# Patient Record
Sex: Female | Born: 1965 | Race: Black or African American | Hispanic: Refuse to answer | Marital: Married | Smoking: Current every day smoker
Health system: Southern US, Community
[De-identification: ages and names within clinical notes are randomized; demographics above are authoritative.]

## PROBLEM LIST (undated history)

## (undated) DIAGNOSIS — E785 Hyperlipidemia, unspecified: Secondary | ICD-10-CM

## (undated) DIAGNOSIS — I10 Essential (primary) hypertension: Secondary | ICD-10-CM

## (undated) DIAGNOSIS — F32A Depression, unspecified: Secondary | ICD-10-CM

## (undated) DIAGNOSIS — F329 Major depressive disorder, single episode, unspecified: Secondary | ICD-10-CM

## (undated) DIAGNOSIS — F319 Bipolar disorder, unspecified: Secondary | ICD-10-CM

## (undated) DIAGNOSIS — E119 Type 2 diabetes mellitus without complications: Secondary | ICD-10-CM

## (undated) HISTORY — PX: TUBAL LIGATION: SHX77

---

## 2007-12-29 ENCOUNTER — Other Ambulatory Visit: Payer: Self-pay

## 2007-12-29 ENCOUNTER — Inpatient Hospital Stay: Payer: Self-pay | Admitting: Internal Medicine

## 2008-02-18 ENCOUNTER — Ambulatory Visit: Payer: Self-pay | Admitting: Internal Medicine

## 2008-02-26 ENCOUNTER — Ambulatory Visit: Payer: Self-pay | Admitting: Internal Medicine

## 2008-04-22 ENCOUNTER — Ambulatory Visit: Payer: Self-pay | Admitting: Internal Medicine

## 2008-09-22 ENCOUNTER — Encounter: Payer: Self-pay | Admitting: Obstetrics and Gynecology

## 2008-10-03 ENCOUNTER — Encounter: Payer: Self-pay | Admitting: Maternal and Fetal Medicine

## 2008-10-17 ENCOUNTER — Encounter: Payer: Self-pay | Admitting: Maternal and Fetal Medicine

## 2008-10-31 ENCOUNTER — Encounter: Payer: Self-pay | Admitting: Obstetrics & Gynecology

## 2008-11-07 ENCOUNTER — Encounter: Payer: Self-pay | Admitting: Obstetrics and Gynecology

## 2008-11-08 ENCOUNTER — Encounter: Payer: Self-pay | Admitting: Pediatric Cardiology

## 2008-11-14 ENCOUNTER — Encounter: Payer: Self-pay | Admitting: Maternal and Fetal Medicine

## 2008-11-17 ENCOUNTER — Observation Stay: Payer: Self-pay | Admitting: Obstetrics and Gynecology

## 2008-11-21 ENCOUNTER — Encounter: Payer: Self-pay | Admitting: Obstetrics & Gynecology

## 2008-11-22 ENCOUNTER — Encounter: Payer: Self-pay | Admitting: Obstetrics & Gynecology

## 2008-11-28 ENCOUNTER — Encounter: Payer: Self-pay | Admitting: Maternal and Fetal Medicine

## 2008-11-29 ENCOUNTER — Encounter: Payer: Self-pay | Admitting: Pediatric Cardiology

## 2008-12-05 ENCOUNTER — Encounter: Payer: Self-pay | Admitting: Obstetrics and Gynecology

## 2009-04-04 ENCOUNTER — Ambulatory Visit: Payer: Self-pay | Admitting: Internal Medicine

## 2009-05-12 ENCOUNTER — Ambulatory Visit: Payer: Self-pay | Admitting: Internal Medicine

## 2010-02-09 ENCOUNTER — Emergency Department: Payer: Self-pay | Admitting: Emergency Medicine

## 2012-05-25 ENCOUNTER — Ambulatory Visit: Payer: Self-pay

## 2013-08-04 ENCOUNTER — Ambulatory Visit: Payer: Self-pay

## 2015-07-31 ENCOUNTER — Other Ambulatory Visit: Payer: Self-pay | Admitting: Gastroenterology

## 2015-07-31 DIAGNOSIS — R945 Abnormal results of liver function studies: Secondary | ICD-10-CM

## 2015-07-31 DIAGNOSIS — R748 Abnormal levels of other serum enzymes: Secondary | ICD-10-CM

## 2015-08-04 ENCOUNTER — Ambulatory Visit: Payer: BLUE CROSS/BLUE SHIELD

## 2015-08-31 ENCOUNTER — Ambulatory Visit
Admission: RE | Admit: 2015-08-31 | Discharge: 2015-08-31 | Disposition: A | Discharge status: Home / Self Care | Payer: BLUE CROSS/BLUE SHIELD | Source: Ambulatory Visit | Attending: Gastroenterology | Admitting: Gastroenterology

## 2015-08-31 DIAGNOSIS — K7689 Other specified diseases of liver: Secondary | ICD-10-CM | POA: Insufficient documentation

## 2015-08-31 DIAGNOSIS — R748 Abnormal levels of other serum enzymes: Secondary | ICD-10-CM | POA: Insufficient documentation

## 2015-08-31 DIAGNOSIS — R945 Abnormal results of liver function studies: Secondary | ICD-10-CM

## 2016-01-09 ENCOUNTER — Emergency Department
Admission: EM | Admit: 2016-01-09 | Discharge: 2016-01-09 | Disposition: A | Discharge status: Home / Self Care | Payer: BLUE CROSS/BLUE SHIELD | Attending: Emergency Medicine | Admitting: Emergency Medicine

## 2016-01-09 ENCOUNTER — Encounter: Payer: Self-pay | Admitting: Emergency Medicine

## 2016-01-09 DIAGNOSIS — F17200 Nicotine dependence, unspecified, uncomplicated: Secondary | ICD-10-CM | POA: Insufficient documentation

## 2016-01-09 DIAGNOSIS — I10 Essential (primary) hypertension: Secondary | ICD-10-CM | POA: Insufficient documentation

## 2016-01-09 DIAGNOSIS — R739 Hyperglycemia, unspecified: Secondary | ICD-10-CM

## 2016-01-09 DIAGNOSIS — E1165 Type 2 diabetes mellitus with hyperglycemia: Secondary | ICD-10-CM | POA: Insufficient documentation

## 2016-01-09 HISTORY — DX: Type 2 diabetes mellitus without complications: E11.9

## 2016-01-09 HISTORY — DX: Essential (primary) hypertension: I10

## 2016-01-09 LAB — BASIC METABOLIC PANEL
ANION GAP: 12 (ref 5–15)
BUN: 9 mg/dL (ref 6–20)
CHLORIDE: 95 mmol/L — AB (ref 101–111)
CO2: 19 mmol/L — ABNORMAL LOW (ref 22–32)
Calcium: 8.8 mg/dL — ABNORMAL LOW (ref 8.9–10.3)
Creatinine, Ser: 1.06 mg/dL — ABNORMAL HIGH (ref 0.44–1.00)
GFR calc Af Amer: >60 mL/min (ref >60)
GFR calc non Af Amer: >60 mL/min (ref >60)
GLUCOSE: 504 mg/dL — AB (ref 65–99)
POTASSIUM: 3.8 mmol/L (ref 3.5–5.1)
Sodium: 126 mmol/L — ABNORMAL LOW (ref 135–145)

## 2016-01-09 LAB — URINALYSIS COMPLETE WITH MICROSCOPIC (ARMC ONLY)
BILIRUBIN URINE: NEGATIVE
HGB URINE DIPSTICK: NEGATIVE
KETONES UR: NEGATIVE mg/dL
LEUKOCYTES UA: NEGATIVE
NITRITE: NEGATIVE
PH: 6 (ref 5.0–8.0)
Protein, ur: NEGATIVE mg/dL
Specific Gravity, Urine: 1.001 — ABNORMAL LOW (ref 1.005–1.030)

## 2016-01-09 LAB — CBC
HEMATOCRIT: 31.5 % — AB (ref 35.0–47.0)
HEMOGLOBIN: 10.1 g/dL — AB (ref 12.0–16.0)
MCH: 31.7 pg (ref 26.0–34.0)
MCHC: 32.2 g/dL (ref 32.0–36.0)
MCV: 98.2 fL (ref 80.0–100.0)
Platelets: 85 10*3/uL — ABNORMAL LOW (ref 150–440)
RBC: 3.2 MIL/uL — ABNORMAL LOW (ref 3.80–5.20)
RDW: 15.5 % — AB (ref 11.5–14.5)
WBC: 5.8 10*3/uL (ref 3.6–11.0)

## 2016-01-09 LAB — GLUCOSE, CAPILLARY
GLUCOSE-CAPILLARY: 506 mg/dL — AB (ref 65–99)
Glucose-Capillary: 246 mg/dL — ABNORMAL HIGH (ref 65–99)
Glucose-Capillary: 186 mg/dL — ABNORMAL HIGH (ref 65–99)

## 2016-01-09 MED ORDER — SODIUM CHLORIDE 0.9 % IV BOLUS (SEPSIS)
1000.0000 mL | Freq: Once | INTRAVENOUS | Status: AC
  Administered 2016-01-09: 1000 mL via INTRAVENOUS

## 2016-01-09 MED ORDER — INSULIN ASPART 100 UNIT/ML ~~LOC~~ SOLN
SUBCUTANEOUS | Status: AC
  Administered 2016-01-09: 10 [IU] via INTRAVENOUS
  Filled 2016-01-09: qty 10

## 2016-01-09 MED ORDER — INSULIN ASPART 100 UNIT/ML ~~LOC~~ SOLN
10.0000 [IU] | Freq: Once | SUBCUTANEOUS | Status: AC
  Administered 2016-01-09: 10 [IU] via INTRAVENOUS
  Filled 2016-01-09: qty 0.1

## 2016-01-09 NOTE — ED Notes (Signed)
Pt reports blood sugar at home of 600.  Took onglyza at 230. Pt is asymptomatic despite blood sugar.  Dr Archie Balboa notified for orders for fluid.

## 2016-01-09 NOTE — Discharge Instructions (Signed)
Hyperglycemia °High blood sugar (hyperglycemia) means that the level of sugar in your blood is higher than it should be. Signs of high blood sugar include: °· Feeling thirsty. °· Frequent peeing (urinating). °· Feeling tired or sleepy. °· Dry mouth. °· Vision changes. °· Feeling weak. °· Feeling hungry but losing weight. °· Numbness and tingling in your hands or feet. °· Headache. °When you ignore these signs, your blood sugar may keep going up. These problems may get worse, and other problems may begin. °HOME CARE °· Check your blood sugars as told by your doctor. Write down the numbers with the date and time. °· Take the right amount of insulin or diabetes pills at the right time. Write down the dose with date and time. °· Refill your insulin or diabetes pills before running out. °· Watch what you eat. Follow your meal plan. °· Drink liquids without sugar, such as water. Check with your doctor if you have kidney or heart disease. °· Follow your doctor's orders for exercise. Exercise at the same time of day. °· Keep your doctor's appointments. °GET HELP RIGHT AWAY IF:  °· You have trouble thinking or are confused. °· You have fast breathing with fruity smelling breath. °· You pass out (faint). °· You have 2 to 3 days of high blood sugars and you do not know why. °· You have chest pain. °· You are feeling sick to your stomach (nauseous) or throwing up (vomiting). °· You have sudden vision changes. °MAKE SURE YOU:  °· Understand these instructions. °· Will watch your condition. °· Will get help right away if you are not doing well or get worse. °  °This information is not intended to replace advice given to you by your health care provider. Make sure you discuss any questions you have with your health care provider. °  °Document Released: 10/06/2009 Document Revised: 12/30/2014 Document Reviewed: 08/15/2015 °Elsevier Interactive Patient Education ©2016 Elsevier Inc. ° °

## 2016-01-09 NOTE — ED Provider Notes (Signed)
[facility] Emergency Department Provider Note  Time seen: 6:07 PM  I have reviewed the triage vital signs and the nursing notes.   HISTORY  Chief Complaint Hyperglycemia    HPI Abigail Rice is a 50 y.o. female with a past medical history of diabetes, hypertension who presents the emergency department with an elevated blood glucose. According to the patient she was in between insurance plans, and was not taking her medications properly the last several weeks up until Saturday when her new insurance kicked in and she was able to fill her new prescription for gliptin.  Denies any recent abdominal pain, nausea, vomiting, diarrhea, fever, cough, congestion. Patient does state she has been peeing much more frequently than normal over the past several weeks. Has been taking her new medication as prescribed for the past 3 days.     Past Medical History  Diagnosis Date  . Diabetes mellitus without complication ([place])   . Hypertension     There are no active problems to display for this patient.   Past Surgical History  Procedure Laterality Date  . Cesarean section      No current outpatient prescriptions on file.  Allergies Review of patient's allergies indicates no known allergies.  History reviewed. No pertinent family history.  Social History Social History  Substance Use Topics  . Smoking status: Current Every Day Smoker  . Smokeless tobacco: None  . Alcohol Use: Yes    Review of Systems Constitutional: Negative for fever. Cardiovascular: Negative for chest pain. Respiratory: Negative for shortness of breath. Gastrointestinal: Negative for abdominal pain. Negative for nausea, vomiting, diarrhea. Genitourinary: Negative for dysuria. Positive for urinary frequency. Neurological: Negative for headache 10-point ROS otherwise negative.  ____________________________________________   PHYSICAL EXAM:  VITAL SIGNS: ED Triage Vitals  Enc  Vitals Group     BP 01/09/16 1541 104/70 mmHg     Pulse Rate 01/09/16 1541 90     Resp 01/09/16 1541 16     Temp 01/09/16 1541 98.4 F (36.9 C)     Temp Source 01/09/16 1541 Oral     SpO2 01/09/16 1541 98 %     Weight 01/09/16 1541 165 lb (74.844 kg)     Height 01/09/16 1541 5\' 5"  (1.651 m)     Head Cir --      Peak Flow --      Pain Score --      Pain Loc --      Pain Edu? --      Excl. in [place]? --     Constitutional: Alert and oriented. Well appearing and in no distress. Eyes: Normal exam ENT   Head: Normocephalic and atraumatic.   Mouth/Throat: Mucous membranes are moist. Cardiovascular: Normal rate, regular rhythm. No murmur Respiratory: Normal respiratory effort without tachypnea nor retractions. Breath sounds are clear and equal bilaterally. No wheezes/rales/rhonchi. Gastrointestinal: Soft and nontender. No distention.  Musculoskeletal: Nontender with normal range of motion in all extremities Neurologic:  Normal speech and language. No gross focal neurologic deficits Skin:  Skin is warm, dry and intact.  Psychiatric: Mood and affect are normal. Speech and behavior are normal.  ____________________________________________     INITIAL IMPRESSION / ASSESSMENT AND PLAN / ED COURSE  Pertinent labs & imaging results that were available during my care of the patient were reviewed by me and considered in my medical decision making (see chart for details).  Overall well-appearing patient presents with hyperglycemia. Normal physical exam. Nontender abdomen. Labs shows significantly  elevated glucose, normal anion gap. We will continue with IV hydration, dose IV insulin, and check every hourly finger sticks. I believe the patient's hyperglycemia is likely due to to her medication noncompliance but she was between insurance plans, now that she is back on her medications I believe this will regulate itself out over the next several days, the patient is to follow-up with her primary  care physician this week, and she is agreeable to do so.  Patient's blood glucose is now under 250. We will discharge home with primary care follow-up this week. Patient agreeable to plan. ____________________________________________   FINAL CLINICAL IMPRESSION(S) / ED DIAGNOSES  Hyperglycemia   Harvest Dark, MD 01/09/16 [phone]

## 2016-01-09 NOTE — ED Notes (Signed)
Iv d'ced from right forearm.  D/c inst to pt.

## 2016-01-09 NOTE — ED Notes (Signed)
Lab called to notify glucose 504,. Dr Archie Balboa already aware of blood sugar from [facility].

## 2016-01-09 NOTE — ED Notes (Signed)
Pt reports high blood sugar today.  Denies urinary sx, visual changes. No pain.  No n/v/d.  Pt states her diabetes medicine was changed 4 days ago.  In infusing.  Pt alert.  No acute distress.  Family with pt.

## 2016-03-28 ENCOUNTER — Encounter: Payer: Self-pay | Admitting: *Deleted

## 2016-03-29 ENCOUNTER — Encounter: Payer: Self-pay | Admitting: Anesthesiology

## 2016-03-29 ENCOUNTER — Ambulatory Visit
Admission: RE | Admit: 2016-03-29 | Discharge: 2016-03-29 | Disposition: A | Discharge status: Home / Self Care | Payer: BLUE CROSS/BLUE SHIELD | Source: Ambulatory Visit | Attending: Gastroenterology | Admitting: Gastroenterology

## 2016-03-29 ENCOUNTER — Ambulatory Visit: Payer: BLUE CROSS/BLUE SHIELD | Admitting: Anesthesiology

## 2016-03-29 ENCOUNTER — Encounter
Admission: RE | Disposition: A | Discharge status: Home / Self Care | Payer: Self-pay | Source: Ambulatory Visit | Attending: Gastroenterology

## 2016-03-29 DIAGNOSIS — Z794 Long term (current) use of insulin: Secondary | ICD-10-CM | POA: Insufficient documentation

## 2016-03-29 DIAGNOSIS — Z833 Family history of diabetes mellitus: Secondary | ICD-10-CM | POA: Diagnosis not present

## 2016-03-29 DIAGNOSIS — Z1211 Encounter for screening for malignant neoplasm of colon: Secondary | ICD-10-CM | POA: Diagnosis present

## 2016-03-29 DIAGNOSIS — E785 Hyperlipidemia, unspecified: Secondary | ICD-10-CM | POA: Insufficient documentation

## 2016-03-29 DIAGNOSIS — K640 First degree hemorrhoids: Secondary | ICD-10-CM | POA: Diagnosis not present

## 2016-03-29 DIAGNOSIS — Z79899 Other long term (current) drug therapy: Secondary | ICD-10-CM | POA: Diagnosis not present

## 2016-03-29 DIAGNOSIS — E139 Other specified diabetes mellitus without complications: Secondary | ICD-10-CM | POA: Diagnosis not present

## 2016-03-29 DIAGNOSIS — Z809 Family history of malignant neoplasm, unspecified: Secondary | ICD-10-CM | POA: Insufficient documentation

## 2016-03-29 DIAGNOSIS — F319 Bipolar disorder, unspecified: Secondary | ICD-10-CM | POA: Diagnosis not present

## 2016-03-29 DIAGNOSIS — F17200 Nicotine dependence, unspecified, uncomplicated: Secondary | ICD-10-CM | POA: Insufficient documentation

## 2016-03-29 DIAGNOSIS — D122 Benign neoplasm of ascending colon: Secondary | ICD-10-CM | POA: Diagnosis not present

## 2016-03-29 DIAGNOSIS — Z8249 Family history of ischemic heart disease and other diseases of the circulatory system: Secondary | ICD-10-CM | POA: Insufficient documentation

## 2016-03-29 DIAGNOSIS — I10 Essential (primary) hypertension: Secondary | ICD-10-CM | POA: Diagnosis not present

## 2016-03-29 HISTORY — DX: Depression, unspecified: F32.A

## 2016-03-29 HISTORY — DX: Major depressive disorder, single episode, unspecified: F32.9

## 2016-03-29 HISTORY — DX: Hyperlipidemia, unspecified: E78.5

## 2016-03-29 HISTORY — DX: Bipolar disorder, unspecified: F31.9

## 2016-03-29 HISTORY — PX: COLONOSCOPY WITH PROPOFOL: SHX5780

## 2016-03-29 LAB — GLUCOSE, CAPILLARY: GLUCOSE-CAPILLARY: 207 mg/dL — AB (ref 65–99)

## 2016-03-29 LAB — POCT PREGNANCY, URINE: PREG TEST UR: NEGATIVE

## 2016-03-29 SURGERY — COLONOSCOPY WITH PROPOFOL
Anesthesia: General

## 2016-03-29 MED ORDER — PROPOFOL 10 MG/ML IV BOLUS
INTRAVENOUS | Status: DC | PRN
Start: 2016-03-29 — End: 2016-03-29
  Administered 2016-03-29: 80 mg via INTRAVENOUS

## 2016-03-29 MED ORDER — PROPOFOL 500 MG/50ML IV EMUL
INTRAVENOUS | Status: DC | PRN
  Administered 2016-03-29: 140 ug/kg/min via INTRAVENOUS

## 2016-03-29 MED ORDER — SODIUM CHLORIDE 0.9 % IV SOLN
INTRAVENOUS | Status: DC
  Administered 2016-03-29 (×2): via INTRAVENOUS

## 2016-03-29 NOTE — H&P (Signed)
  Primary Care Physician:  Lavera Guise, MD  Pre-Procedure History & Physical: HPI:  Abigail Rice is a 50 y.o. female is here for an colonoscopy.   Past Medical History  Diagnosis Date  . Diabetes mellitus without complication ([place])   . Hypertension   . Depression   . Bipolar disorder ([place])   . Hyperlipidemia     Past Surgical History  Procedure Laterality Date  . Cesarean section    . Tubal ligation      Prior to Admission medications   Medication Sig Start Date End Date Taking? Authorizing Provider  amLODipine (NORVASC) 5 MG tablet Take 5 mg by mouth daily.   Yes Historical Provider, MD  insulin aspart (NOVOLOG) 100 unit/mL injection Inject 10 Units into the skin daily with lunch. Sliding Scale   Yes Historical Provider, MD  insulin aspart protamine- aspart (NOVOLOG MIX 70/30) (70-30) 100 UNIT/ML injection Inject 24 Units into the skin 2 (two) times daily with a meal. Breakfast and Dinner   Yes Historical Provider, MD  levothyroxine (SYNTHROID, LEVOTHROID) 50 MCG tablet Take 50 mcg by mouth daily before breakfast.   Yes Historical Provider, MD  losartan (COZAAR) 50 MG tablet Take 50 mg by mouth daily.   Yes Historical Provider, MD  sitaGLIPtin (JANUVIA) 100 MG tablet Take 100 mg by mouth daily.   Yes Historical Provider, MD  VITAMIN D, ERGOCALCIFEROL, PO Take by mouth.   Yes Historical Provider, MD    Allergies as of 03/12/2016  . (No Known Allergies)    History reviewed. No pertinent family history.  Social History   Social History  . Marital Status: Married    Spouse Name: N/A  . Number of Children: N/A  . Years of Education: N/A   Occupational History  . Not on file.   Social History Main Topics  . Smoking status: Current Every Day Smoker  . Smokeless tobacco: Never Used  . Alcohol Use: Yes  . Drug Use: No  . Sexual Activity: Not on file   Other Topics Concern  . Not on file   Social History Narrative     Physical Exam: BP 126/77 mmHg  Pulse 80   Temp(Src) 97.8 F (36.6 C) (Tympanic)  Resp 15  Ht 5\' 5"  (1.651 m)  Wt 74.844 kg (165 lb)  BMI 27.46 kg/m2  SpO2 100%  LMP 11/07/2015 (Approximate) General:   Alert,  pleasant and cooperative in NAD Head:  Normocephalic and atraumatic. Neck:  Supple; no masses or thyromegaly. Lungs:  Clear throughout to auscultation.    Heart:  Regular rate and rhythm. Abdomen:  Soft, nontender and nondistended. Normal bowel sounds, without guarding, and without rebound.   Neurologic:  Alert and  oriented x4;  grossly normal neurologically.  Impression/Plan: Abigail Rice is here for an colonoscopy to be performed for screening  Risks, benefits, limitations, and alternatives regarding  colonoscopy have been reviewed with the patient.  Questions have been answered.  All parties agreeable.   Josefine Class, MD  03/29/2016, 9:41 AM

## 2016-03-29 NOTE — Anesthesia Postprocedure Evaluation (Signed)
Anesthesia Post Note  Patient: Abigail Rice  Procedure(s) Performed: Procedure(s) (LRB): COLONOSCOPY WITH PROPOFOL (N/A)  Patient location during evaluation: Endoscopy Anesthesia Type: General Level of consciousness: awake and alert Pain management: pain level controlled Vital Signs Assessment: post-procedure vital signs reviewed and stable Respiratory status: spontaneous breathing, nonlabored ventilation, respiratory function stable and patient connected to nasal cannula oxygen Cardiovascular status: blood pressure returned to baseline and stable Postop Assessment: no signs of nausea or vomiting Anesthetic complications: no    Last Vitals:  Filed Vitals:   03/29/16 1020 03/29/16 1030  BP: 111/69 122/83  Pulse: 86 80  Temp:    Resp: 26 24    Last Pain: There were no vitals filed for this visit.               Dlisa Barnwell S

## 2016-03-29 NOTE — Discharge Instructions (Signed)

## 2016-03-29 NOTE — Op Note (Signed)
[facility] Patient Name: Abigail Rice Procedure Date: 03/29/2016 9:42 AM MRN: [id number] Account #: [id number] Date of Birth: [birth year] Admit Type: Outpatient Age: 50 Room: [facility] ENDO ROOM 3 Gender: Female Note Status: Finalized Procedure:            Colonoscopy Indications:          Screening for colorectal malignant neoplasm, This is                        the patient's first colonoscopy Patient Profile:      This is a 50 year old female. Providers:            Gerrit Heck. Rayann Heman, MD Referring MD:         Lavera Guise, MD (Referring MD) Medicines:            Propofol per Anesthesia Complications:        No immediate complications. Procedure:            Pre-Anesthesia Assessment:                       - Prior to the procedure, a History and Physical was                        performed, and patient medications, allergies and                        sensitivities were reviewed. The patient's tolerance of                        previous anesthesia was reviewed.                       - Prior to the procedure, a History and Physical was                        performed, and patient medications, allergies and                        sensitivities were reviewed. The patient's tolerance of                        previous anesthesia was reviewed.                       After obtaining informed consent, the colonoscope was                        passed under direct vision. Throughout the procedure,                        the patient's blood pressure, pulse, and oxygen                        saturations were monitored continuously. The                        Colonoscope was introduced through the anus and                        advanced to the the cecum, identified by appendiceal  orifice and ileocecal valve. The colonoscopy was                        performed without difficulty. The patient tolerated the                        procedure  well. The quality of the bowel preparation                        was good. Findings:      The perianal and digital rectal examinations were normal.      Two sessile polyps were found in the proximal ascending colon. The       polyps were 3 to 4 mm in size. These polyps were removed with a jumbo       cold forceps. Resection and retrieval were complete.      Internal hemorrhoids were found during retroflexion. The hemorrhoids       were Grade I (internal hemorrhoids that do not prolapse).      The exam was otherwise without abnormality. Impression:           - Two 3 to 4 mm polyps in the proximal ascending colon,                        removed with a jumbo cold forceps. Resected and                        retrieved.                       - Internal hemorrhoids.                       - The examination was otherwise normal. Recommendation:       - Observe patient in GI recovery unit.                       - High fiber diet.                       - Continue present medications.                       - Await pathology results.                       - Repeat colonoscopy for surveillance based on                        pathology results.                       - Return to GI clinic.                       - The findings and recommendations were discussed with                        the patient.                       - The findings and recommendations were discussed with  the patient's family. Procedure Code(s):    --- Professional ---                       [phone], Colonoscopy, flexible; with biopsy, single or                        multiple Diagnosis Code(s):    --- Professional ---                       Z12.11, Encounter for screening for malignant neoplasm                        of colon                       D12.2, Benign neoplasm of ascending colon                       K64.0, First degree hemorrhoids CPT copyright 2016 American Medical Association. All rights  reserved. The codes documented in this report are preliminary and upon coder review may  be revised to meet current compliance requirements. Mellody Life, MD 03/29/2016 10:09:25 AM This report has been signed electronically. Number of Addenda: 0 Note Initiated On: 03/29/2016 9:42 AM Scope Withdrawal Time: 0 hours 15 minutes 20 seconds  Total Procedure Duration: 0 hours 17 minutes 45 seconds       [facility]

## 2016-03-29 NOTE — Transfer of Care (Signed)
Immediate Anesthesia Transfer of Care Note  Patient: Abigail Rice  Procedure(s) Performed: Procedure(s): COLONOSCOPY WITH PROPOFOL (N/A)  Patient Location: PACU  Anesthesia Type:General  Level of Consciousness: awake  Airway & Oxygen Therapy: Patient Spontanous Breathing  Post-op Assessment: Report given to RN  Post vital signs: Reviewed  Last Vitals:  Filed Vitals:   03/29/16 0858  BP: 126/77  Pulse: 80  Temp: 36.6 C  Resp: 15    Complications: No apparent anesthesia complications

## 2016-03-29 NOTE — Anesthesia Preprocedure Evaluation (Addendum)
Anesthesia Evaluation  Patient identified by MRN, date of birth, ID band Patient awake    Reviewed: Allergy & Precautions, NPO status , Patient's Chart, lab work & pertinent test results, reviewed documented beta blocker date and time   Airway Mallampati: II  TM Distance: >3 FB     Dental  (+) Chipped, Poor Dentition, Missing   Pulmonary Current Smoker,           Cardiovascular hypertension, Pt. on medications      Neuro/Psych PSYCHIATRIC DISORDERS Depression Bipolar Disorder    GI/Hepatic   Endo/Other  diabetes, Type 2  Renal/GU      Musculoskeletal   Abdominal   Peds  Hematology   Anesthesia Other Findings   Reproductive/Obstetrics                            Anesthesia Physical Anesthesia Plan  ASA: III  Anesthesia Plan: General   Post-op Pain Management:    Induction: Intravenous  Airway Management Planned: Nasal Cannula  Additional Equipment:   Intra-op Plan:   Post-operative Plan:   Informed Consent: I have reviewed the patients History and Physical, chart, labs and discussed the procedure including the risks, benefits and alternatives for the proposed anesthesia with the patient or authorized representative who has indicated his/her understanding and acceptance.     Plan Discussed with: CRNA  Anesthesia Plan Comments:         Anesthesia Quick Evaluation

## 2016-04-01 LAB — SURGICAL PATHOLOGY

## 2016-04-02 ENCOUNTER — Encounter: Payer: Self-pay | Admitting: Gastroenterology

## 2016-06-13 DIAGNOSIS — R748 Abnormal levels of other serum enzymes: Secondary | ICD-10-CM | POA: Insufficient documentation

## 2017-05-29 ENCOUNTER — Other Ambulatory Visit: Payer: Self-pay | Admitting: Student

## 2017-05-29 DIAGNOSIS — R7989 Other specified abnormal findings of blood chemistry: Secondary | ICD-10-CM

## 2017-05-29 DIAGNOSIS — R1084 Generalized abdominal pain: Secondary | ICD-10-CM

## 2017-05-29 DIAGNOSIS — R945 Abnormal results of liver function studies: Principal | ICD-10-CM

## 2017-05-30 ENCOUNTER — Other Ambulatory Visit
Admission: RE | Admit: 2017-05-30 | Discharge: 2017-05-30 | Disposition: A | Discharge status: Home / Self Care | Payer: Self-pay | Source: Ambulatory Visit | Attending: Student | Admitting: Student

## 2017-05-30 DIAGNOSIS — R197 Diarrhea, unspecified: Secondary | ICD-10-CM | POA: Insufficient documentation

## 2017-05-30 LAB — C DIFFICILE QUICK SCREEN W PCR REFLEX
C DIFFICLE (CDIFF) ANTIGEN: NEGATIVE
C Diff interpretation: NOT DETECTED
C Diff toxin: NEGATIVE

## 2017-05-30 LAB — GASTROINTESTINAL PANEL BY PCR, STOOL (REPLACES STOOL CULTURE)
ADENOVIRUS F40/41: NOT DETECTED
ASTROVIRUS: NOT DETECTED
CAMPYLOBACTER SPECIES: NOT DETECTED
CRYPTOSPORIDIUM: NOT DETECTED
CYCLOSPORA CAYETANENSIS: NOT DETECTED
ENTAMOEBA HISTOLYTICA: NOT DETECTED
ENTEROPATHOGENIC E COLI (EPEC): NOT DETECTED
ENTEROTOXIGENIC E COLI (ETEC): NOT DETECTED
Enteroaggregative E coli (EAEC): NOT DETECTED
GIARDIA LAMBLIA: NOT DETECTED
Norovirus GI/GII: NOT DETECTED
PLESIMONAS SHIGELLOIDES: NOT DETECTED
Rotavirus A: NOT DETECTED
Salmonella species: NOT DETECTED
Sapovirus (I, II, IV, and V): NOT DETECTED
Shiga like toxin producing E coli (STEC): NOT DETECTED
Shigella/Enteroinvasive E coli (EIEC): NOT DETECTED
VIBRIO CHOLERAE: NOT DETECTED
VIBRIO SPECIES: NOT DETECTED
YERSINIA ENTEROCOLITICA: NOT DETECTED

## 2017-06-02 ENCOUNTER — Ambulatory Visit: Payer: BLUE CROSS/BLUE SHIELD

## 2017-06-05 LAB — PANCREATIC ELASTASE, FECAL

## 2017-06-06 ENCOUNTER — Ambulatory Visit: Payer: Managed Care, Other (non HMO)

## 2017-07-16 ENCOUNTER — Ambulatory Visit: Admission: RE | Admit: 2017-07-16 | Payer: Managed Care, Other (non HMO) | Source: Ambulatory Visit

## 2018-01-21 ENCOUNTER — Other Ambulatory Visit: Payer: Self-pay | Admitting: Internal Medicine

## 2018-01-23 ENCOUNTER — Encounter: Payer: Self-pay | Admitting: Nurse Practitioner

## 2018-01-23 ENCOUNTER — Ambulatory Visit: Payer: Managed Care, Other (non HMO) | Admitting: Nurse Practitioner

## 2018-01-23 VITALS — BP 140/80 | HR 92 | Resp 16 | Ht 65.0 in | Wt 192.8 lb

## 2018-01-23 DIAGNOSIS — Z1239 Encounter for other screening for malignant neoplasm of breast: Secondary | ICD-10-CM

## 2018-01-23 DIAGNOSIS — I10 Essential (primary) hypertension: Secondary | ICD-10-CM | POA: Diagnosis not present

## 2018-01-23 DIAGNOSIS — Z1231 Encounter for screening mammogram for malignant neoplasm of breast: Secondary | ICD-10-CM | POA: Diagnosis not present

## 2018-01-23 DIAGNOSIS — E039 Hypothyroidism, unspecified: Secondary | ICD-10-CM | POA: Diagnosis not present

## 2018-01-23 DIAGNOSIS — E1165 Type 2 diabetes mellitus with hyperglycemia: Secondary | ICD-10-CM | POA: Diagnosis not present

## 2018-01-23 LAB — POCT GLYCOSYLATED HEMOGLOBIN (HGB A1C): HEMOGLOBIN A1C: 5.1

## 2018-01-23 MED ORDER — LEVOTHYROXINE SODIUM 50 MCG PO TABS: 50.0000 ug | ORAL_TABLET | Freq: Every day | ORAL | 4 refills | Status: DC

## 2018-01-23 MED ORDER — LOSARTAN POTASSIUM 50 MG PO TABS: 50.0000 mg | ORAL_TABLET | Freq: Every day | ORAL | 4 refills | Status: DC

## 2018-01-23 MED ORDER — AMLODIPINE BESYLATE 5 MG PO TABS: 5.0000 mg | ORAL_TABLET | Freq: Every day | ORAL | 4 refills | Status: DC

## 2018-01-23 MED ORDER — INSULIN LISPRO PROT & LISPRO (75-25 MIX) 100 UNIT/ML KWIKPEN: 24.0000 [IU] | PEN_INJECTOR | Freq: Two times a day (BID) | SUBCUTANEOUS | 4 refills | Status: DC

## 2018-01-23 NOTE — Progress Notes (Addendum)
[facility] [place], [place] [address]  Internal MEDICINE  Office Visit Note  Patient Name: Abigail Rice  [id number]  [id number]  Date of Service: 01/23/2018  No chief complaint on file.   Diabetes  She presents for her follow-up diabetic visit. She has type 2 diabetes mellitus. No MedicAlert identification noted. Her disease course has been improving. Hypoglycemia symptoms include sleepiness. Pertinent negatives for hypoglycemia include no headaches or nervousness/anxiousness. Associated symptoms include polydipsia and polyphagia. Pertinent negatives for diabetes include no fatigue and no weakness. Symptoms are stable.    Pt is here for routine follow up.     Current Medication: Outpatient Encounter Medications as of 01/23/2018  Medication Sig  . amLODipine (NORVASC) 5 MG tablet Take 5 mg by mouth daily.  . insulin lispro (HUMALOG KWIKPEN) 100 UNIT/ML KiwkPen Inject into the skin. Use at lunch with sliding scale may use up to 10 units [place]  . Insulin Lispro Prot & Lispro (HUMALOG MIX 75/25 KWIKPEN) (75-25) 100 UNIT/ML Kwikpen Inject into the skin.  Marland Kitchen levothyroxine (SYNTHROID, LEVOTHROID) 50 MCG tablet Take 50 mcg by mouth daily before breakfast.  . losartan (COZAAR) 50 MG tablet Take 50 mg by mouth daily.  . ONE TOUCH ULTRA TEST test strip STRIPS FOR BLOOD SUGAR TESTING 1 TIME DAILY. DX 250.00  . [DISCONTINUED] insulin aspart (NOVOLOG) 100 unit/mL injection Inject 10 Units into the skin daily with lunch. Sliding Scale  . [DISCONTINUED] insulin aspart protamine- aspart (NOVOLOG MIX 70/30) (70-30) 100 UNIT/ML injection Inject 24 Units into the skin 2 (two) times daily with a meal. Breakfast and Dinner  . [DISCONTINUED] sitaGLIPtin (JANUVIA) 100 MG tablet Take 100 mg by mouth daily.  . [DISCONTINUED] VITAMIN D, ERGOCALCIFEROL, PO Take by mouth.   No facility-administered encounter medications on file as of 01/23/2018.     Surgical History: Past Surgical History:   Procedure Laterality Date  . CESAREAN SECTION    . COLONOSCOPY WITH PROPOFOL N/A 03/29/2016   Procedure: COLONOSCOPY WITH PROPOFOL;  Surgeon: Josefine Class, MD;  Location: [facility] ENDOSCOPY;  Service: Endoscopy;  Laterality: N/A;  . TUBAL LIGATION      Medical History: Past Medical History:  Diagnosis Date  . Bipolar disorder ([place])   . Depression   . Diabetes mellitus without complication ([place])   . Hyperlipidemia   . Hypertension     Family History: Family History  Problem Relation Age of Onset  . Diabetes Mother   . Hypertension Mother     Social History   Socioeconomic History  . Marital status: Married    Spouse name: Not on file  . Number of children: Not on file  . Years of education: Not on file  . Highest education level: Not on file  Social Needs  . Financial resource strain: Not on file  . Food insecurity - worry: Not on file  . Food insecurity - inability: Not on file  . Transportation needs - medical: Not on file  . Transportation needs - non-medical: Not on file  Occupational History  . Not on file  Tobacco Use  . Smoking status: Current Every Day Smoker  . Smokeless tobacco: Never Used  Substance and Sexual Activity  . Alcohol use: Yes  . Drug use: No  . Sexual activity: Not on file  Other Topics Concern  . Not on file  Social History Narrative  . Not on file      Review of Systems  Constitutional: Negative for activity change, appetite  change, fatigue and unexpected weight change.  HENT: Negative for congestion, postnasal drip, sinus pain, sneezing and sore throat.   Respiratory: Negative for chest tightness, shortness of breath and wheezing.   Gastrointestinal: Negative for constipation, diarrhea and vomiting.  Endocrine: Positive for polydipsia and polyphagia.       Currently taking 24units humalog 70/30. Blood sugars running in the low 100s. Few lower than 100 and few higher than 200.   Musculoskeletal: Negative for arthralgias, back  pain and myalgias.  Skin: Negative.   Allergic/Immunologic: Negative for environmental allergies, food allergies and immunocompromised state.  Neurological: Negative for weakness and headaches.  Hematological: Negative.   Psychiatric/Behavioral: The patient is not nervous/anxious.     Today's Vitals   01/23/18 0843  BP: 140/80  Pulse: 92  Resp: 16  SpO2: 98%  Weight: 192 lb 12.8 oz (87.5 kg)  Height: 5\' 5"  (1.651 m)    Physical Exam  Constitutional: She is oriented to person, place, and time. She appears well-developed and well-nourished. No distress.  HENT:  Head: Normocephalic and atraumatic.  Mouth/Throat: Oropharynx is clear and moist. No oropharyngeal exudate.  Eyes: EOM are normal. Pupils are equal, round, and reactive to light.  Neck: Normal range of motion. Neck supple. No JVD present. Carotid bruit is not present. No tracheal deviation present. No thyromegaly present.  Cardiovascular: Normal rate, regular rhythm and normal heart sounds. Exam reveals no gallop and no friction rub.  No murmur heard. Pulmonary/Chest: Effort normal and breath sounds normal. No respiratory distress. She has no wheezes. She has no rales. She exhibits no tenderness.  Abdominal: Soft. Bowel sounds are normal. There is no tenderness.  Musculoskeletal: Normal range of motion.  Lymphadenopathy:    She has no cervical adenopathy.  Neurological: She is alert and oriented to person, place, and time. No cranial nerve deficit.  Skin: Skin is warm and dry. She is not diaphoretic.  Psychiatric: She has a normal mood and affect. Her behavior is normal. Judgment and thought content normal.   Assessment/Plan:  1. Uncontrolled type 2 diabetes mellitus with hyperglycemia (HCC) - POCT HgB A1C - 5.1 today. Continue diabetic medications as prescribed.  - Insulin Lispro Prot & Lispro (HUMALOG MIX 75/25 KWIKPEN) (75-25) 100 UNIT/ML Kwikpen; Inject 24 Units into the skin 2 (two) times daily.  Dispense: 15 mL;  Refill: 4  2. Acquired hypothyroidism - levothyroxine (SYNTHROID, LEVOTHROID) 50 MCG tablet; Take 1 tablet (50 mcg total) by mouth daily before breakfast.  Dispense: 90 tablet; Refill: 4  3. Essential hypertension Stable. Continue bp medication as prescribed.  - amLODipine (NORVASC) 5 MG tablet; Take 1 tablet (5 mg total) by mouth daily.  Dispense: 90 tablet; Refill: 4 - losartan (COZAAR) 50 MG tablet; Take 1 tablet (50 mg total) by mouth daily.  Dispense: 90 tablet; Refill: 4  4. Screening for breast cancer - MM DIGITAL SCREENING BILATERAL; Future  General Counseling: Anvitha verbalizes understanding of the findings of todays visit and agrees with plan of treatment. I have discussed any further diagnostic evaluation that may be needed or ordered today. We also reviewed her medications today. she has been encouraged to call the office with any questions or concerns that should arise related to todays visit.   This patient was seen by Leretha Pol, FNP- C in Collaboration with Dr Lavera Guise as a part of collaborative care agreement    Orders Placed This Encounter  Procedures  . POCT HgB A1C      Time spent: 20  Minutes     Dr Lavera Guise Internal medicine

## 2018-01-30 LAB — CBC WITH DIFFERENTIAL/PLATELET
BASOS ABS: 0 10*3/uL (ref 0.0–0.2)
Basos: 1 %
EOS (ABSOLUTE): 0.1 10*3/uL (ref 0.0–0.4)
Eos: 2 %
Hematocrit: 31.7 % — ABNORMAL LOW (ref 34.0–46.6)
Hemoglobin: 10.7 g/dL — ABNORMAL LOW (ref 11.1–15.9)
IMMATURE GRANULOCYTES: 0 %
Immature Grans (Abs): 0 10*3/uL (ref 0.0–0.1)
LYMPHS ABS: 2.2 10*3/uL (ref 0.7–3.1)
Lymphs: 38 %
MCH: 32.6 pg (ref 26.6–33.0)
MCHC: 33.8 g/dL (ref 31.5–35.7)
MCV: 97 fL (ref 79–97)
MONOCYTES: 10 %
MONOS ABS: 0.6 10*3/uL (ref 0.1–0.9)
NEUTROS PCT: 49 %
Neutrophils Absolute: 2.9 10*3/uL (ref 1.4–7.0)
Platelets: 143 10*3/uL — ABNORMAL LOW (ref 150–379)
RBC: 3.28 x10E6/uL — ABNORMAL LOW (ref 3.77–5.28)
RDW: 14 % (ref 12.3–15.4)
WBC: 5.8 10*3/uL (ref 3.4–10.8)

## 2018-01-30 LAB — COMPREHENSIVE METABOLIC PANEL
ALK PHOS: 174 IU/L — AB (ref 39–117)
ALT: 50 IU/L — AB (ref 0–32)
AST: 55 IU/L — AB (ref 0–40)
Albumin/Globulin Ratio: 1.1 — ABNORMAL LOW (ref 1.2–2.2)
Albumin: 4.1 g/dL (ref 3.5–5.5)
BUN/Creatinine Ratio: 12 (ref 9–23)
BUN: 11 mg/dL (ref 6–24)
Bilirubin Total: 0.5 mg/dL (ref 0.0–1.2)
CALCIUM: 9 mg/dL (ref 8.7–10.2)
CO2: 22 mmol/L (ref 20–29)
Chloride: 103 mmol/L (ref 96–106)
Creatinine, Ser: 0.94 mg/dL (ref 0.57–1.00)
GFR calc Af Amer: 81 mL/min/{1.73_m2} (ref >59)
GFR calc non Af Amer: 70 mL/min/{1.73_m2} (ref >59)
GLUCOSE: 110 mg/dL — AB (ref 65–99)
Globulin, Total: 3.6 g/dL (ref 1.5–4.5)
Potassium: 4.9 mmol/L (ref 3.5–5.2)
Sodium: 138 mmol/L (ref 134–144)
Total Protein: 7.7 g/dL (ref 6.0–8.5)

## 2018-01-30 LAB — LIPID PANEL WITH LDL/HDL RATIO
CHOLESTEROL TOTAL: 114 mg/dL (ref 100–199)
HDL: 43 mg/dL (ref >39)
LDL CALC: 43 mg/dL (ref 0–99)
LDl/HDL Ratio: 1 ratio (ref 0.0–3.2)
TRIGLYCERIDES: 142 mg/dL (ref 0–149)
VLDL CHOLESTEROL CAL: 28 mg/dL (ref 5–40)

## 2018-01-30 LAB — T4, FREE: Free T4: 1.13 ng/dL (ref 0.82–1.77)

## 2018-01-30 LAB — AMYLASE: AMYLASE: 278 U/L — AB (ref 31–124)

## 2018-01-30 LAB — LIPASE: Lipase: 44 U/L (ref 14–72)

## 2018-01-30 LAB — TSH: TSH: 2 u[IU]/mL (ref 0.450–4.500)

## 2018-02-13 ENCOUNTER — Telehealth: Payer: Self-pay

## 2018-02-13 NOTE — Telephone Encounter (Signed)
Advised pt that labs have improved.  dbs

## 2018-02-13 NOTE — Telephone Encounter (Signed)
-----   Message from Lavera Guise, MD sent at 02/12/2018 10:38 AM EST ----- Improved

## 2018-05-28 ENCOUNTER — Encounter: Payer: Managed Care, Other (non HMO) | Admitting: Nurse Practitioner

## 2018-08-07 ENCOUNTER — Ambulatory Visit (INDEPENDENT_AMBULATORY_CARE_PROVIDER_SITE_OTHER): Payer: Managed Care, Other (non HMO) | Admitting: Nurse Practitioner

## 2018-08-07 ENCOUNTER — Encounter: Payer: Self-pay | Admitting: Nurse Practitioner

## 2018-08-07 VITALS — BP 108/75 | HR 84 | Resp 16 | Ht 65.0 in | Wt 187.4 lb

## 2018-08-07 DIAGNOSIS — G2581 Restless legs syndrome: Secondary | ICD-10-CM | POA: Insufficient documentation

## 2018-08-07 DIAGNOSIS — E119 Type 2 diabetes mellitus without complications: Secondary | ICD-10-CM | POA: Insufficient documentation

## 2018-08-07 DIAGNOSIS — I10 Essential (primary) hypertension: Secondary | ICD-10-CM | POA: Diagnosis not present

## 2018-08-07 DIAGNOSIS — Z0001 Encounter for general adult medical examination with abnormal findings: Secondary | ICD-10-CM | POA: Diagnosis not present

## 2018-08-07 DIAGNOSIS — Z1239 Encounter for other screening for malignant neoplasm of breast: Secondary | ICD-10-CM | POA: Insufficient documentation

## 2018-08-07 DIAGNOSIS — R300 Dysuria: Secondary | ICD-10-CM | POA: Insufficient documentation

## 2018-08-07 DIAGNOSIS — E785 Hyperlipidemia, unspecified: Secondary | ICD-10-CM | POA: Insufficient documentation

## 2018-08-07 DIAGNOSIS — F329 Major depressive disorder, single episode, unspecified: Secondary | ICD-10-CM | POA: Insufficient documentation

## 2018-08-07 DIAGNOSIS — E1165 Type 2 diabetes mellitus with hyperglycemia: Secondary | ICD-10-CM | POA: Diagnosis not present

## 2018-08-07 DIAGNOSIS — K580 Irritable bowel syndrome with diarrhea: Secondary | ICD-10-CM | POA: Insufficient documentation

## 2018-08-07 DIAGNOSIS — F319 Bipolar disorder, unspecified: Secondary | ICD-10-CM | POA: Insufficient documentation

## 2018-08-07 DIAGNOSIS — E039 Hypothyroidism, unspecified: Secondary | ICD-10-CM

## 2018-08-07 DIAGNOSIS — F32A Depression, unspecified: Secondary | ICD-10-CM | POA: Insufficient documentation

## 2018-08-07 DIAGNOSIS — Z794 Long term (current) use of insulin: Secondary | ICD-10-CM | POA: Insufficient documentation

## 2018-08-07 LAB — POCT GLYCOSYLATED HEMOGLOBIN (HGB A1C): HEMOGLOBIN A1C: 5.6 % (ref 4.0–5.6)

## 2018-08-07 MED ORDER — AMLODIPINE BESYLATE 5 MG PO TABS: 5.0000 mg | ORAL_TABLET | Freq: Every day | ORAL | 4 refills | Status: DC

## 2018-08-07 MED ORDER — LEVOTHYROXINE SODIUM 50 MCG PO TABS: 50.0000 ug | ORAL_TABLET | Freq: Every day | ORAL | 4 refills | Status: DC

## 2018-08-07 MED ORDER — ROPINIROLE HCL 0.25 MG PO TABS: ORAL_TABLET | ORAL | 3 refills | Status: DC

## 2018-08-07 MED ORDER — LOSARTAN POTASSIUM 50 MG PO TABS: 50.0000 mg | ORAL_TABLET | Freq: Every day | ORAL | 4 refills | Status: DC

## 2018-08-07 MED ORDER — HYOSCYAMINE SULFATE 0.125 MG PO TABS: 0.1250 mg | ORAL_TABLET | Freq: Four times a day (QID) | ORAL | 2 refills | Status: DC | PRN

## 2018-08-07 MED ORDER — INSULIN LISPRO PROT & LISPRO (75-25 MIX) 100 UNIT/ML KWIKPEN: 24.0000 [IU] | PEN_INJECTOR | Freq: Two times a day (BID) | SUBCUTANEOUS | 4 refills | Status: DC

## 2018-08-07 NOTE — Progress Notes (Signed)
[facility] [place], [place] [address]  Internal MEDICINE  Office Visit Note  Patient Name: Abigail Rice  [id number]  [id number]  Date of Service: 08/07/2018  Chief Complaint  Patient presents with  . Annual Exam  . Diabetes  . Abdominal Cramping     Diabetes  She presents for her follow-up diabetic visit. She has type 2 diabetes mellitus. Her disease course has been stable. There are no hypoglycemic associated symptoms. Pertinent negatives for hypoglycemia include no dizziness, headaches or nervousness/anxiousness. There are no diabetic associated symptoms. Pertinent negatives for diabetes include no chest pain, no fatigue and no weakness. There are no hypoglycemic complications. Symptoms are stable. There are no diabetic complications. Risk factors for coronary artery disease include hypertension and post-menopausal. Current diabetic treatment includes insulin injections. She is compliant with treatment all of the time. She is following a generally healthy diet. Meal planning includes avoidance of concentrated sweets. She has not had a previous visit with a dietitian. She participates in exercise intermittently. There is no change in her home blood glucose trend. An ACE inhibitor/angiotensin II receptor blocker is being taken. She does not see a podiatrist.Eye exam is not current.  Abdominal Cramping  This is a recurrent problem. The current episode started more than 1 year ago. The onset quality is undetermined. The problem occurs every several days. The problem has been waxing and waning. The pain is located in the epigastric region, suprapubic region and rectum. The pain is at a severity of 4/10. The pain is moderate. The quality of the pain is cramping, colicky and a sensation of fullness. Associated symptoms include diarrhea and nausea. Pertinent negatives include no arthralgias, constipation, headaches, myalgias or vomiting. The pain is aggravated by eating.  The pain is relieved by belching, passing flatus and bowel movements. Treatments tried: anti-spasmodics. The treatment provided mild relief. Prior diagnostic workup includes CT scan and ultrasound.   Pt is here for routine health maintenance examination  Current Medication: Outpatient Encounter Medications as of 08/07/2018  Medication Sig  . amLODipine (NORVASC) 5 MG tablet Take 1 tablet (5 mg total) by mouth daily.  . insulin lispro (HUMALOG KWIKPEN) 100 UNIT/ML KiwkPen Inject into the skin. Use at lunch with sliding scale may use up to 10 units [place]  . Insulin Lispro Prot & Lispro (HUMALOG MIX 75/25 KWIKPEN) (75-25) 100 UNIT/ML Kwikpen Inject 24 Units into the skin 2 (two) times daily.  Marland Kitchen levothyroxine (SYNTHROID, LEVOTHROID) 50 MCG tablet Take 1 tablet (50 mcg total) by mouth daily before breakfast.  . losartan (COZAAR) 50 MG tablet Take 1 tablet (50 mg total) by mouth daily.  . ONE TOUCH ULTRA TEST test strip STRIPS FOR BLOOD SUGAR TESTING 1 TIME DAILY. DX 250.00  . [DISCONTINUED] amLODipine (NORVASC) 5 MG tablet Take 1 tablet (5 mg total) by mouth daily.  . [DISCONTINUED] Insulin Lispro Prot & Lispro (HUMALOG MIX 75/25 KWIKPEN) (75-25) 100 UNIT/ML Kwikpen Inject 24 Units into the skin 2 (two) times daily.  . [DISCONTINUED] levothyroxine (SYNTHROID, LEVOTHROID) 50 MCG tablet Take 1 tablet (50 mcg total) by mouth daily before breakfast.  . [DISCONTINUED] losartan (COZAAR) 50 MG tablet Take 1 tablet (50 mg total) by mouth daily.  . hyoscyamine (LEVSIN, ANASPAZ) 0.125 MG tablet Take 1 tablet (0.125 mg total) by mouth every 6 (six) hours as needed for cramping.  Marland Kitchen rOPINIRole (REQUIP) 0.25 MG tablet Take 1 to 2 tablets po QHS prn   No facility-administered encounter medications on file as of  08/07/2018.     Surgical History: Past Surgical History:  Procedure Laterality Date  . CESAREAN SECTION    . COLONOSCOPY WITH PROPOFOL N/A 03/29/2016   Procedure: COLONOSCOPY WITH PROPOFOL;  Surgeon: Josefine Class, MD;  Location: [facility] ENDOSCOPY;  Service: Endoscopy;  Laterality: N/A;  . TUBAL LIGATION      Medical History: Past Medical History:  Diagnosis Date  . Bipolar disorder ([place])   . Depression   . Diabetes mellitus without complication ([place])   . Hyperlipidemia   . Hypertension     Family History: Family History  Problem Relation Age of Onset  . Diabetes Mother   . Hypertension Mother       Review of Systems  Constitutional: Negative for activity change, appetite change, fatigue and unexpected weight change.  HENT: Negative for congestion, postnasal drip, sinus pain, sneezing and sore throat.   Eyes: Negative.   Respiratory: Negative for chest tightness, shortness of breath and wheezing.   Cardiovascular: Negative for chest pain and palpitations.  Gastrointestinal: Positive for diarrhea and nausea. Negative for constipation and vomiting.       Abdominal cramping.  Endocrine:       Currently taking 24units humalog 75/25. Blood sugars running in the low 100s. Few lower than 100 and few higher than 200.   Genitourinary: Negative.   Musculoskeletal: Negative for arthralgias, back pain and myalgias.  Skin: Negative.   Allergic/Immunologic: Negative for environmental allergies, food allergies and immunocompromised state.  Neurological: Negative for dizziness, weakness and headaches.  Hematological: Negative for adenopathy.  Psychiatric/Behavioral: Negative for dysphoric mood. The patient is not nervous/anxious.      Today's Vitals   08/07/18 0931  BP: 108/75  Pulse: 84  Resp: 16  SpO2: 99%  Weight: 187 lb 6.4 oz (85 kg)  Height: 5\' 5"  (1.651 m)    Physical Exam  Constitutional: She is oriented to person, place, and time. She appears well-developed and well-nourished. No distress.  HENT:  Head: Normocephalic and atraumatic.  Nose: Nose normal.  Mouth/Throat: Oropharynx is clear and moist. No oropharyngeal exudate.  Eyes: Pupils are equal, round, and  reactive to light. Conjunctivae and EOM are normal.  Neck: Normal range of motion. Neck supple. No JVD present. Carotid bruit is not present. No tracheal deviation present. No thyromegaly present.  Cardiovascular: Normal rate, regular rhythm, normal heart sounds and intact distal pulses. Exam reveals no gallop and no friction rub.  No murmur heard. Pulses:      Dorsalis pedis pulses are 2+ on the right side, and 2+ on the left side.       Posterior tibial pulses are 2+ on the right side, and 2+ on the left side.  Pulmonary/Chest: Effort normal and breath sounds normal. No respiratory distress. She has no wheezes. She has no rales. She exhibits no tenderness. Right breast exhibits no inverted nipple, no mass, no nipple discharge, no skin change and no tenderness. Left breast exhibits no inverted nipple, no mass, no nipple discharge, no skin change and no tenderness.  Abdominal: Soft. Bowel sounds are normal. There is no tenderness.  Musculoskeletal: Normal range of motion.       Right foot: There is normal range of motion and no deformity.       Left foot: There is normal range of motion and no deformity.  Feet:  Right Foot:  Protective Sensation: 10 sites tested. 10 sites sensed.  Skin Integrity: Negative for erythema or warmth.  Left Foot:  Protective Sensation: 10  sites tested. 10 sites sensed.  Skin Integrity: Negative for erythema or warmth.  Lymphadenopathy:    She has no cervical adenopathy.  Neurological: She is alert and oriented to person, place, and time. No cranial nerve deficit.  Skin: Skin is warm and dry. She is not diaphoretic.  Psychiatric: She has a normal mood and affect. Her behavior is normal. Judgment and thought content normal.  Nursing note and vitals reviewed.    LABS: Recent Results (from the past 2160 hour(s))  POCT HgB A1C     Status: None   Collection Time: 08/07/18  9:48 AM  Result Value Ref Range   Hemoglobin A1C 5.6 4.0 - 5.6 %   HbA1c POC (<> result,  manual entry)     HbA1c, POC (prediabetic range)     HbA1c, POC (controlled diabetic range)     Assessment/Plan: 1. Encounter for general adult medical examination with abnormal findings Annual health maintenance exam today  2. Uncontrolled type 2 diabetes mellitus with hyperglycemia (HCC) - POCT HgB A1C 5.4 today. Continue humalog mix 75/25units twice daily. Use slding scale if needed. Refer for diabetic eye exam.  - Ambulatory referral to Ophthalmology - Insulin Lispro Prot & Lispro (HUMALOG MIX 75/25 KWIKPEN) (75-25) 100 UNIT/ML Kwikpen; Inject 24 Units into the skin 2 (two) times daily.  Dispense: 15 mL; Refill: 4  3. Irritable bowel syndrome with diarrhea Reviewed results of prior imaging and colonoscopy results which are essentially without acute abnormalities. Continue levsin as needed and as prescribed. Will get new CT abdomen and pelvis if continues.  - hyoscyamine (LEVSIN, ANASPAZ) 0.125 MG tablet; Take 1 tablet (0.125 mg total) by mouth every 6 (six) hours as needed for cramping.  Dispense: 120 tablet; Refill: 2  4. Essential hypertension - amLODipine (NORVASC) 5 MG tablet; Take 1 tablet (5 mg total) by mouth daily.  Dispense: 90 tablet; Refill: 4 - losartan (COZAAR) 50 MG tablet; Take 1 tablet (50 mg total) by mouth daily.  Dispense: 90 tablet; Refill: 4  5. Acquired hypothyroidism - levothyroxine (SYNTHROID, LEVOTHROID) 50 MCG tablet; Take 1 tablet (50 mcg total) by mouth daily before breakfast.  Dispense: 90 tablet; Refill: 4  7. Restless legs - rOPINIRole (REQUIP) 0.25 MG tablet; Take 1 to 2 tablets po QHS prn  Dispense: 60 tablet; Refill: 3  8. Dysuria - UA/M w/rflx Culture, Routine  General Counseling: Richard verbalizes understanding of the findings of todays visit and agrees with plan of treatment. I have discussed any further diagnostic evaluation that may be needed or ordered today. We also reviewed her medications today. she has been encouraged to call the office with  any questions or concerns that should arise related to todays visit.    Counseling:  Diabetes Counseling:  1. Addition of ACE inh/ ARB'S for nephroprotection. Microalbumin is updated  2. Diabetic foot care, prevention of complications. Podiatry consult 3. Exercise and lose weight.  4. Diabetic eye examination, Diabetic eye exam is updated  5. Monitor blood sugar closlely. nutrition counseling.  6. Sign and symptoms of hypoglycemia including shaking sweating,confusion and headaches.   Orders Placed This Encounter  Procedures  . UA/M w/rflx Culture, Routine  . Ambulatory referral to Ophthalmology  . POCT HgB A1C    Meds ordered this encounter  Medications  . amLODipine (NORVASC) 5 MG tablet    Sig: Take 1 tablet (5 mg total) by mouth daily.    Dispense:  90 tablet    Refill:  4    Order Specific Question:  Supervising Provider    Answer:   Lavera Guise [[id number]]  . levothyroxine (SYNTHROID, LEVOTHROID) 50 MCG tablet    Sig: Take 1 tablet (50 mcg total) by mouth daily before breakfast.    Dispense:  90 tablet    Refill:  4    Order Specific Question:   Supervising Provider    Answer:   Lavera Guise [place]  . losartan (COZAAR) 50 MG tablet    Sig: Take 1 tablet (50 mg total) by mouth daily.    Dispense:  90 tablet    Refill:  4    Order Specific Question:   Supervising Provider    Answer:   Lavera Guise [[id number]]  . Insulin Lispro Prot & Lispro (HUMALOG MIX 75/25 KWIKPEN) (75-25) 100 UNIT/ML Kwikpen    Sig: Inject 24 Units into the skin 2 (two) times daily.    Dispense:  15 mL    Refill:  4    Order Specific Question:   Supervising Provider    Answer:   Lavera Guise [[id number]]  . rOPINIRole (REQUIP) 0.25 MG tablet    Sig: Take 1 to 2 tablets po QHS prn    Dispense:  60 tablet    Refill:  3    Order Specific Question:   Supervising Provider    Answer:   Lavera Guise [place]  . hyoscyamine (LEVSIN, ANASPAZ) 0.125 MG tablet    Sig: Take 1 tablet (0.125 mg total) by mouth  every 6 (six) hours as needed for cramping.    Dispense:  120 tablet    Refill:  2    Order Specific Question:   Supervising Provider    Answer:   Lavera Guise [[id number]]    Time spent: [place], MD  Internal Medicine

## 2018-08-09 ENCOUNTER — Other Ambulatory Visit: Payer: Self-pay | Admitting: Nurse Practitioner

## 2018-08-09 DIAGNOSIS — N390 Urinary tract infection, site not specified: Secondary | ICD-10-CM

## 2018-08-09 MED ORDER — CIPROFLOXACIN HCL 500 MG PO TABS: 500.0000 mg | ORAL_TABLET | Freq: Two times a day (BID) | ORAL | 0 refills | Status: DC

## 2018-08-09 NOTE — Progress Notes (Signed)
Urine sample positive for infection at [facility]. Start cipro 500mg  bid for 10 days. New rx sent to her pharmacy.

## 2018-08-09 NOTE — Progress Notes (Signed)
Please let the patient know that Urine sample positive for infection at [facility]. Start cipro 500mg  bid for 10 days. New rx sent to her pharmacy. thanks

## 2018-08-10 ENCOUNTER — Telehealth: Payer: Self-pay

## 2018-08-10 NOTE — Telephone Encounter (Signed)
-----   Message from Ronnell Freshwater, NP sent at 08/09/2018  4:33 PM EDT ----- Please let the patient know that Urine sample positive for infection at [facility]pe. Start cipro 500mg  bid for 10 days. New rx sent to her pharmacy. thanks

## 2018-08-10 NOTE — Telephone Encounter (Signed)
Called pt back and informed her that her prescription is ready to be picked up for her urine

## 2018-08-10 NOTE — Telephone Encounter (Signed)
Left a message with the pt husband to tell pt to call me back for her urine results

## 2018-08-13 LAB — UA/M W/RFLX CULTURE, ROUTINE
BILIRUBIN UA: NEGATIVE
Glucose, UA: NEGATIVE
KETONES UA: NEGATIVE
Nitrite, UA: POSITIVE — AB
Protein, UA: NEGATIVE
RBC UA: NEGATIVE
SPEC GRAV UA: 1.006 (ref 1.005–1.030)
UUROB: 0.2 mg/dL (ref 0.2–1.0)
pH, UA: 5 (ref 5.0–7.5)

## 2018-08-13 LAB — URINE CULTURE, REFLEX

## 2018-08-13 LAB — MICROSCOPIC EXAMINATION
Casts: NONE SEEN /lpf
Epithelial Cells (non renal): NONE SEEN /hpf (ref 0–10)
WBC, UA: >30 /hpf — AB (ref 0–5)

## 2018-12-11 ENCOUNTER — Ambulatory Visit: Payer: Managed Care, Other (non HMO) | Admitting: Nurse Practitioner

## 2018-12-11 ENCOUNTER — Encounter: Payer: Self-pay | Admitting: Nurse Practitioner

## 2018-12-11 VITALS — BP 142/87 | HR 94 | Resp 16 | Ht 65.0 in | Wt 189.4 lb

## 2018-12-11 DIAGNOSIS — I10 Essential (primary) hypertension: Secondary | ICD-10-CM | POA: Diagnosis not present

## 2018-12-11 DIAGNOSIS — E559 Vitamin D deficiency, unspecified: Secondary | ICD-10-CM | POA: Insufficient documentation

## 2018-12-11 DIAGNOSIS — G2581 Restless legs syndrome: Secondary | ICD-10-CM

## 2018-12-11 DIAGNOSIS — E1165 Type 2 diabetes mellitus with hyperglycemia: Secondary | ICD-10-CM | POA: Diagnosis not present

## 2018-12-11 DIAGNOSIS — E039 Hypothyroidism, unspecified: Secondary | ICD-10-CM

## 2018-12-11 DIAGNOSIS — Z1239 Encounter for other screening for malignant neoplasm of breast: Secondary | ICD-10-CM

## 2018-12-11 LAB — POCT GLYCOSYLATED HEMOGLOBIN (HGB A1C): Hemoglobin A1C: 5.7 % — AB (ref 4.0–5.6)

## 2018-12-11 MED ORDER — INSULIN LISPRO PROT & LISPRO (75-25 MIX) 100 UNIT/ML KWIKPEN: 24.0000 [IU] | PEN_INJECTOR | Freq: Two times a day (BID) | SUBCUTANEOUS | 3 refills | Status: DC

## 2018-12-11 MED ORDER — LOSARTAN POTASSIUM 50 MG PO TABS: 50.0000 mg | ORAL_TABLET | Freq: Every day | ORAL | 3 refills | Status: DC

## 2018-12-11 MED ORDER — LEVOTHYROXINE SODIUM 50 MCG PO TABS: 50.0000 ug | ORAL_TABLET | Freq: Every day | ORAL | 3 refills | Status: DC

## 2018-12-11 MED ORDER — AMLODIPINE BESYLATE 5 MG PO TABS: 5.0000 mg | ORAL_TABLET | Freq: Every day | ORAL | 3 refills | Status: DC

## 2018-12-11 MED ORDER — ROPINIROLE HCL 0.25 MG PO TABS: ORAL_TABLET | ORAL | 3 refills | Status: DC

## 2018-12-11 NOTE — Progress Notes (Signed)
[facility] [place], [place] [address]  Internal MEDICINE  Office Visit Note  Patient Name: Abigail Rice  [id number]  [id number]  Date of Service: 12/11/2018  Chief Complaint  Patient presents with  . Medical Management of Chronic Issues    4 month follow up  . Diabetes  . Hypertension    The patient is here for routine follow up visit. Blood sugars doing well. Taking humalog mix 75/25, twenty-four units twice daily. Doing very well. Blood pressure well controlled. Is due to have routine, fasting labs checked. Needs to have refills for all prescriptions.       Current Medication: Outpatient Encounter Medications as of 12/11/2018  Medication Sig  . amLODipine (NORVASC) 5 MG tablet Take 1 tablet (5 mg total) by mouth daily.  . hyoscyamine (LEVSIN, ANASPAZ) 0.125 MG tablet Take 1 tablet (0.125 mg total) by mouth every 6 (six) hours as needed for cramping.  . insulin lispro (HUMALOG KWIKPEN) 100 UNIT/ML KiwkPen Inject into the skin. Use at lunch with sliding scale may use up to 10 units   . Insulin Lispro Prot & Lispro (HUMALOG MIX 75/25 KWIKPEN) (75-25) 100 UNIT/ML Kwikpen Inject 24 Units into the skin 2 (two) times daily.  Marland Kitchen levothyroxine (SYNTHROID, LEVOTHROID) 50 MCG tablet Take 1 tablet (50 mcg total) by mouth daily before breakfast.  . losartan (COZAAR) 50 MG tablet Take 1 tablet (50 mg total) by mouth daily.  . ONE TOUCH ULTRA TEST test strip STRIPS FOR BLOOD SUGAR TESTING 1 TIME DAILY. DX 250.00  . rOPINIRole (REQUIP) 0.25 MG tablet Take 1 to 2 tablets po QHS prn  . [DISCONTINUED] amLODipine (NORVASC) 5 MG tablet Take 1 tablet (5 mg total) by mouth daily.  . [DISCONTINUED] Insulin Lispro Prot & Lispro (HUMALOG MIX 75/25 KWIKPEN) (75-25) 100 UNIT/ML Kwikpen Inject 24 Units into the skin 2 (two) times daily.  . [DISCONTINUED] levothyroxine (SYNTHROID, LEVOTHROID) 50 MCG tablet Take 1 tablet (50 mcg total) by mouth daily before breakfast.  .  [DISCONTINUED] losartan (COZAAR) 50 MG tablet Take 1 tablet (50 mg total) by mouth daily.  . [DISCONTINUED] rOPINIRole (REQUIP) 0.25 MG tablet Take 1 to 2 tablets po QHS prn  . ciprofloxacin (CIPRO) 500 MG tablet Take 1 tablet (500 mg total) by mouth 2 (two) times daily. (Patient not taking: Reported on 12/11/2018)   No facility-administered encounter medications on file as of 12/11/2018.     Surgical History: Past Surgical History:  Procedure Laterality Date  . CESAREAN SECTION    . COLONOSCOPY WITH PROPOFOL N/A 03/29/2016   Procedure: COLONOSCOPY WITH PROPOFOL;  Surgeon: Josefine Class, MD;  Location: [facility] ENDOSCOPY;  Service: Endoscopy;  Laterality: N/A;  . TUBAL LIGATION      Medical History: Past Medical History:  Diagnosis Date  . Bipolar disorder ([place])   . Depression   . Diabetes mellitus without complication ([place])   . Hyperlipidemia   . Hypertension     Family History: Family History  Problem Relation Age of Onset  . Diabetes Mother   . Hypertension Mother     Social History   Socioeconomic History  . Marital status: Married    Spouse name: Not on file  . Number of children: Not on file  . Years of education: Not on file  . Highest education level: Not on file  Occupational History  . Not on file  Social Needs  . Financial resource strain: Not on file  . Food insecurity:  Worry: Not on file    Inability: Not on file  . Transportation needs:    Medical: Not on file    Non-medical: Not on file  Tobacco Use  . Smoking status: Current Every Day Smoker    Types: Cigarettes  . Smokeless tobacco: Never Used  Substance and Sexual Activity  . Alcohol use: Yes  . Drug use: No  . Sexual activity: Not on file  Lifestyle  . Physical activity:    Days per week: Not on file    Minutes per session: Not on file  . Stress: Not on file  Relationships  . Social connections:    Talks on phone: Not on file    Gets together: Not on file    Attends religious  service: Not on file    Active member of club or organization: Not on file    Attends meetings of clubs or organizations: Not on file    Relationship status: Not on file  . Intimate partner violence:    Fear of current or ex partner: Not on file    Emotionally abused: Not on file    Physically abused: Not on file    Forced sexual activity: Not on file  Other Topics Concern  . Not on file  Social History Narrative  . Not on file      Review of Systems  Constitutional: Negative for activity change, appetite change, fatigue and unexpected weight change.  HENT: Negative for congestion, postnasal drip, sinus pain, sneezing and sore throat.   Eyes: Negative.   Respiratory: Negative for chest tightness, shortness of breath and wheezing.   Cardiovascular: Negative for chest pain and palpitations.  Gastrointestinal: Negative for constipation, diarrhea, nausea and vomiting.  Endocrine: Negative for polydipsia and polyuria.       Currently taking 24units humalog 75/25. Blood sugars running in the low 100s. Few lower than 100 and few higher than 200.   Genitourinary: Negative.   Musculoskeletal: Negative for arthralgias, back pain and myalgias.       Intermittent cramping in the lags at night. Started taking OTC potassium supplements and has improved the cramping.   Skin: Negative.   Allergic/Immunologic: Negative for environmental allergies, food allergies and immunocompromised state.  Neurological: Negative for dizziness, weakness and headaches.  Hematological: Negative for adenopathy.  Psychiatric/Behavioral: Negative for dysphoric mood. The patient is not nervous/anxious.    Today's Vitals   12/11/18 0841  BP: (!) 142/87  Pulse: 94  Resp: 16  SpO2: 99%  Weight: 189 lb 6.4 oz (85.9 kg)  Height: 5\' 5"  (1.651 m)    Physical Exam Vitals signs and nursing note reviewed.  Constitutional:      General: She is not in acute distress.    Appearance: She is well-developed. She is not  diaphoretic.  HENT:     Head: Normocephalic and atraumatic.     Nose: Nose normal.     Mouth/Throat:     Pharynx: No oropharyngeal exudate.  Eyes:     Pupils: Pupils are equal, round, and reactive to light.  Neck:     Musculoskeletal: Normal range of motion and neck supple.     Thyroid: No thyromegaly.     Vascular: No carotid bruit or JVD.     Trachea: No tracheal deviation.  Cardiovascular:     Rate and Rhythm: Normal rate and regular rhythm.     Heart sounds: Normal heart sounds. No murmur. No friction rub. No gallop.   Pulmonary:  Effort: Pulmonary effort is normal. No respiratory distress.     Breath sounds: Normal breath sounds. No wheezing or rales.  Chest:     Chest wall: No tenderness.  Abdominal:     General: Bowel sounds are normal.     Palpations: Abdomen is soft.     Tenderness: There is no abdominal tenderness.  Musculoskeletal: Normal range of motion.  Lymphadenopathy:     Cervical: No cervical adenopathy.  Skin:    General: Skin is warm and dry.  Neurological:     General: No focal deficit present.     Mental Status: She is alert and oriented to person, place, and time.     Cranial Nerves: No cranial nerve deficit.  Psychiatric:        Mood and Affect: Mood normal.        Behavior: Behavior normal.        Thought Content: Thought content normal.        Judgment: Judgment normal.   Assessment/Plan: 1. Uncontrolled type 2 diabetes mellitus with hyperglycemia (HCC) - POCT HgB A1C 5.7 today. Continue diabetic medications as prescribed.  - Insulin Lispro Prot & Lispro (HUMALOG MIX 75/25 KWIKPEN) (75-25) 100 UNIT/ML Kwikpen; Inject 24 Units into the skin 2 (two) times daily.  Dispense: 15 mL; Refill: 3 - Comprehensive metabolic panel - Lipid panel  2. Essential hypertension Stable. Continue bp medication as prescribed  - amLODipine (NORVASC) 5 MG tablet; Take 1 tablet (5 mg total) by mouth daily.  Dispense: 90 tablet; Refill: 3 - losartan (COZAAR) 50 MG  tablet; Take 1 tablet (50 mg total) by mouth daily.  Dispense: 90 tablet; Refill: 3 - CBC with Differential/Platelet - Comprehensive metabolic panel - Lipid panel  3. Acquired hypothyroidism Check thyroid panel and adjust levothyroxine as indicated.  - levothyroxine (SYNTHROID, LEVOTHROID) 50 MCG tablet; Take 1 tablet (50 mcg total) by mouth daily before breakfast.  Dispense: 90 tablet; Refill: 3 - CBC with Differential/Platelet - T4, free - TSH  4. Restless legs May continue ropinerole 0.25mg  at bedtime as needed. Refills provided today.  - rOPINIRole (REQUIP) 0.25 MG tablet; Take 1 to 2 tablets po QHS prn  Dispense: 120 tablet; Refill: 3  5. Vitamin D deficiency - Vitamin D 1,25 dihydroxy  6. Screening for breast cancer - MM DIGITAL SCREENING BILATERAL; Future    General Counseling: Leeah verbalizes understanding of the findings of todays visit and agrees with plan of treatment. I have discussed any further diagnostic evaluation that may be needed or ordered today. We also reviewed her medications today. she has been encouraged to call the office with any questions or concerns that should arise related to todays visit.  Diabetes Counseling:  1. Addition of ACE inh/ ARB'S for nephroprotection. Microalbumin is updated  2. Diabetic foot care, prevention of complications. Podiatry consult 3. Exercise and lose weight.  4. Diabetic eye examination, Diabetic eye exam is updated  5. Monitor blood sugar closlely. nutrition counseling.  6. Sign and symptoms of hypoglycemia including shaking sweating,confusion and headaches.  This patient was seen by Leretha Pol FNP Collaboration with Dr Lavera Guise as a part of collaborative care agreement  Orders Placed This Encounter  Procedures  . MM DIGITAL SCREENING BILATERAL  . CBC with Differential/Platelet  . Comprehensive metabolic panel  . T4, free  . TSH  . Lipid panel  . Vitamin D 1,25 dihydroxy  . POCT HgB A1C    Meds ordered  this encounter  Medications  . amLODipine (  NORVASC) 5 MG tablet    Sig: Take 1 tablet (5 mg total) by mouth daily.    Dispense:  90 tablet    Refill:  3    Order Specific Question:   Supervising Provider    Answer:   Lavera Guise [[id number]]  . Insulin Lispro Prot & Lispro (HUMALOG MIX 75/25 KWIKPEN) (75-25) 100 UNIT/ML Kwikpen    Sig: Inject 24 Units into the skin 2 (two) times daily.    Dispense:  15 mL    Refill:  3    Order Specific Question:   Supervising Provider    Answer:   Lavera Guise [[id number]]  . levothyroxine (SYNTHROID, LEVOTHROID) 50 MCG tablet    Sig: Take 1 tablet (50 mcg total) by mouth daily before breakfast.    Dispense:  90 tablet    Refill:  3    Order Specific Question:   Supervising Provider    Answer:   Lavera Guise [place]  . losartan (COZAAR) 50 MG tablet    Sig: Take 1 tablet (50 mg total) by mouth daily.    Dispense:  90 tablet    Refill:  3    Order Specific Question:   Supervising Provider    Answer:   Lavera Guise [[id number]]  . rOPINIRole (REQUIP) 0.25 MG tablet    Sig: Take 1 to 2 tablets po QHS prn    Dispense:  120 tablet    Refill:  3    Please fill as 90 day prescription.    Order Specific Question:   Supervising Provider    Answer:   Lavera Guise [[id number]]    Time spent: 42 Minutes      Dr Lavera Guise Internal medicine

## 2018-12-14 ENCOUNTER — Telehealth: Payer: Self-pay | Admitting: Internal Medicine

## 2018-12-14 NOTE — Telephone Encounter (Signed)
Mammogram scheduled for 03/01/19 at 11:30 with [facility].[email or web address]

## 2018-12-23 DIAGNOSIS — Z87898 Personal history of other specified conditions: Secondary | ICD-10-CM

## 2018-12-23 HISTORY — DX: Personal history of other specified conditions: Z87.898

## 2019-01-15 ENCOUNTER — Other Ambulatory Visit: Payer: Self-pay

## 2019-01-15 ENCOUNTER — Other Ambulatory Visit: Payer: Self-pay | Admitting: Nurse Practitioner

## 2019-01-15 ENCOUNTER — Telehealth: Payer: Self-pay

## 2019-01-15 DIAGNOSIS — N390 Urinary tract infection, site not specified: Secondary | ICD-10-CM

## 2019-01-15 MED ORDER — CIPROFLOXACIN HCL 500 MG PO TABS: 500.0000 mg | ORAL_TABLET | Freq: Two times a day (BID) | ORAL | 0 refills | Status: DC

## 2019-01-15 NOTE — Telephone Encounter (Signed)
Patient c/o uti symptoms. History of recurrent uti. Repeat cipro for 7 days. If no improvement in symptoms, she will need to be seen.

## 2019-01-15 NOTE — Telephone Encounter (Signed)
PT WAS NOTIFIED AND AWARE SHE WILL NEED TO BE SEEN IF SYMPTOMS FAIL TO IMPROVE.

## 2019-01-15 NOTE — Progress Notes (Signed)
Patient c/o uti symptoms. History of recurrent uti. Repeat cipro for 7 days. If no improvement in symptoms, she will need to be seen.

## 2019-03-18 ENCOUNTER — Other Ambulatory Visit: Payer: Self-pay

## 2019-03-18 MED ORDER — PEN NEEDLES 32G X 4 MM MISC: 1.0000 | Freq: Once | 1 refills | Status: DC

## 2019-03-19 ENCOUNTER — Other Ambulatory Visit: Payer: Self-pay

## 2019-03-19 MED ORDER — PEN NEEDLES 32G X 4 MM MISC: 1.0000 | Freq: Every day | 1 refills | Status: DC

## 2019-04-16 ENCOUNTER — Ambulatory Visit: Payer: Self-pay | Admitting: Nurse Practitioner

## 2019-05-19 ENCOUNTER — Other Ambulatory Visit: Payer: Self-pay | Admitting: Internal Medicine

## 2019-05-19 DIAGNOSIS — N390 Urinary tract infection, site not specified: Secondary | ICD-10-CM

## 2019-05-20 NOTE — Telephone Encounter (Signed)
Pt has an app in one week, why does she need a refill on this cipro

## 2019-05-28 ENCOUNTER — Ambulatory Visit: Payer: Managed Care, Other (non HMO) | Admitting: Nurse Practitioner

## 2019-05-28 ENCOUNTER — Other Ambulatory Visit: Payer: Self-pay

## 2019-05-28 ENCOUNTER — Encounter: Payer: Self-pay | Admitting: Nurse Practitioner

## 2019-05-28 VITALS — BP 100/70 | HR 94 | Resp 16 | Ht 65.0 in | Wt 183.2 lb

## 2019-05-28 DIAGNOSIS — K580 Irritable bowel syndrome with diarrhea: Secondary | ICD-10-CM

## 2019-05-28 DIAGNOSIS — N949 Unspecified condition associated with female genital organs and menstrual cycle: Secondary | ICD-10-CM

## 2019-05-28 DIAGNOSIS — E039 Hypothyroidism, unspecified: Secondary | ICD-10-CM

## 2019-05-28 DIAGNOSIS — I10 Essential (primary) hypertension: Secondary | ICD-10-CM

## 2019-05-28 DIAGNOSIS — E1165 Type 2 diabetes mellitus with hyperglycemia: Secondary | ICD-10-CM | POA: Diagnosis not present

## 2019-05-28 DIAGNOSIS — N390 Urinary tract infection, site not specified: Secondary | ICD-10-CM

## 2019-05-28 LAB — POCT GLYCOSYLATED HEMOGLOBIN (HGB A1C): Hemoglobin A1C: 5.4 % (ref 4.0–5.6)

## 2019-05-28 LAB — POCT URINALYSIS DIPSTICK
Bilirubin, UA: NEGATIVE
Glucose, UA: NEGATIVE
Ketones, UA: NEGATIVE
Nitrite, UA: POSITIVE
Protein, UA: POSITIVE — AB
Spec Grav, UA: 1.01 (ref 1.010–1.025)
Urobilinogen, UA: 0.2 E.U./dL
pH, UA: 5 (ref 5.0–8.0)

## 2019-05-28 MED ORDER — CIPROFLOXACIN HCL 500 MG PO TABS: 500.0000 mg | ORAL_TABLET | Freq: Two times a day (BID) | ORAL | 2 refills | Status: DC

## 2019-05-28 MED ORDER — HYOSCYAMINE SULFATE 0.125 MG PO TABS: 0.1250 mg | ORAL_TABLET | Freq: Four times a day (QID) | ORAL | 2 refills | Status: DC | PRN

## 2019-05-28 NOTE — Addendum Note (Signed)
Addended by: Lenon Oms on: 05/28/2019 11:59 AM   Modules accepted: Orders

## 2019-05-28 NOTE — Progress Notes (Signed)
[facility] [place], [place] [address]  Internal MEDICINE  Office Visit Note  Patient Name: Abigail Rice  [id number]  [id number]  Date of Service: 05/28/2019  Chief Complaint  Patient presents with  . Medical Management of Chronic Issues    4 month follow up, requesting lab work  . Diabetes    A1C  . Urinary Tract Infection    requesting refill on ABX, pt has been having symptoms for a while, recurring uti's, pt stated that she is experiencing burning when urinating    The patient is here for routine follow up visit. Her blood sugars are doing very well. Taking all medications as prescribed. She states that she has bladder pain and spasms for past few weeks. She has history of intermittent urinary tract infections. She has been treated with antibiotics in the past which has successfully treated infections. HgbA1c is 5.4 today.       Current Medication: Outpatient Encounter Medications as of 05/28/2019  Medication Sig  . amLODipine (NORVASC) 5 MG tablet Take 1 tablet (5 mg total) by mouth daily.  . hyoscyamine (LEVSIN) 0.125 MG tablet Take 1 tablet (0.125 mg total) by mouth every 6 (six) hours as needed for cramping.  . insulin lispro (HUMALOG KWIKPEN) 100 UNIT/ML KiwkPen Inject into the skin. Use at lunch with sliding scale may use up to 10 units [place]  . Insulin Lispro Prot & Lispro (HUMALOG MIX 75/25 KWIKPEN) (75-25) 100 UNIT/ML Kwikpen Inject 24 Units into the skin 2 (two) times daily.  . Insulin Pen Needle (PEN NEEDLES) 32G X 4 MM MISC 1 each by Does not apply route daily.  Marland Kitchen levothyroxine (SYNTHROID, LEVOTHROID) 50 MCG tablet Take 1 tablet (50 mcg total) by mouth daily before breakfast.  . losartan (COZAAR) 50 MG tablet Take 1 tablet (50 mg total) by mouth daily.  . ONE TOUCH ULTRA TEST test strip STRIPS FOR BLOOD SUGAR TESTING 1 TIME DAILY. DX 250.00  . rOPINIRole (REQUIP) 0.25 MG tablet Take 1 to 2 tablets po QHS prn  . [DISCONTINUED] hyoscyamine  (LEVSIN, ANASPAZ) 0.125 MG tablet Take 1 tablet (0.125 mg total) by mouth every 6 (six) hours as needed for cramping.  . ciprofloxacin (CIPRO) 500 MG tablet Take 1 tablet (500 mg total) by mouth 2 (two) times daily.  . [DISCONTINUED] ciprofloxacin (CIPRO) 500 MG tablet Take 1 tablet (500 mg total) by mouth 2 (two) times daily. (Patient not taking: Reported on 05/28/2019)  . [DISCONTINUED] losartan (COZAAR) 50 MG tablet TAKE 1 TABLET BY MOUTH EVERY DAY   No facility-administered encounter medications on file as of 05/28/2019.     Surgical History: Past Surgical History:  Procedure Laterality Date  . CESAREAN SECTION    . COLONOSCOPY WITH PROPOFOL N/A 03/29/2016   Procedure: COLONOSCOPY WITH PROPOFOL;  Surgeon: Josefine Class, MD;  Location: [facility] ENDOSCOPY;  Service: Endoscopy;  Laterality: N/A;  . TUBAL LIGATION      Medical History: Past Medical History:  Diagnosis Date  . Bipolar disorder ([place])   . Depression   . Diabetes mellitus without complication ([place])   . Hyperlipidemia   . Hypertension     Family History: Family History  Problem Relation Age of Onset  . Diabetes Mother   . Hypertension Mother     Social History   Socioeconomic History  . Marital status: Married    Spouse name: Not on file  . Number of children: Not on file  . Years of education: Not on  file  . Highest education level: Not on file  Occupational History  . Not on file  Social Needs  . Financial resource strain: Not on file  . Food insecurity:    Worry: Not on file    Inability: Not on file  . Transportation needs:    Medical: Not on file    Non-medical: Not on file  Tobacco Use  . Smoking status: Current Every Day Smoker    Types: Cigarettes  . Smokeless tobacco: Never Used  Substance and Sexual Activity  . Alcohol use: Yes  . Drug use: No  . Sexual activity: Not on file  Lifestyle  . Physical activity:    Days per week: Not on file    Minutes per session: Not on file  . Stress:  Not on file  Relationships  . Social connections:    Talks on phone: Not on file    Gets together: Not on file    Attends religious service: Not on file    Active member of club or organization: Not on file    Attends meetings of clubs or organizations: Not on file    Relationship status: Not on file  . Intimate partner violence:    Fear of current or ex partner: Not on file    Emotionally abused: Not on file    Physically abused: Not on file    Forced sexual activity: Not on file  Other Topics Concern  . Not on file  Social History Narrative  . Not on file      Review of Systems  Constitutional: Negative for activity change, appetite change, fatigue and unexpected weight change.  HENT: Negative for congestion, postnasal drip, sinus pain, sneezing and sore throat.   Respiratory: Negative for chest tightness, shortness of breath and wheezing.   Cardiovascular: Negative for chest pain and palpitations.  Gastrointestinal: Negative for constipation, diarrhea, nausea and vomiting.  Endocrine: Negative for polydipsia and polyuria.       Currently taking 24units humalog 75/25. Blood sugars running in the low 100s. Few lower than 100 and few higher than 200.   Genitourinary: Positive for dysuria, frequency and urgency.  Musculoskeletal: Negative for arthralgias, back pain and myalgias.  Skin: Negative for rash.  Allergic/Immunologic: Negative for environmental allergies, food allergies and immunocompromised state.  Neurological: Negative for dizziness, weakness and headaches.  Hematological: Negative for adenopathy.  Psychiatric/Behavioral: Negative for dysphoric mood. The patient is not nervous/anxious.    Today's Vitals   05/28/19 0933  BP: 100/70  Pulse: 94  Resp: 16  SpO2: 96%  Weight: 183 lb 3.2 oz (83.1 kg)  Height: 5\' 5"  (1.651 m)   Body mass index is 30.49 kg/m.  Physical Exam Vitals signs and nursing note reviewed.  Constitutional:      General: She is not in  acute distress.    Appearance: Normal appearance. She is well-developed. She is not diaphoretic.  HENT:     Head: Normocephalic and atraumatic.     Nose: Nose normal.     Mouth/Throat:     Pharynx: No oropharyngeal exudate.  Eyes:     Pupils: Pupils are equal, round, and reactive to light.  Neck:     Musculoskeletal: Normal range of motion and neck supple.     Thyroid: No thyromegaly.     Vascular: No carotid bruit or JVD.     Trachea: No tracheal deviation.  Cardiovascular:     Rate and Rhythm: Normal rate and regular rhythm.  Heart sounds: Normal heart sounds. No murmur. No friction rub. No gallop.   Pulmonary:     Effort: Pulmonary effort is normal. No respiratory distress.     Breath sounds: Normal breath sounds. No wheezing or rales.  Chest:     Chest wall: No tenderness.  Abdominal:     General: Bowel sounds are normal.     Palpations: Abdomen is soft.     Tenderness: There is no abdominal tenderness.  Genitourinary:    Comments: Urine sample positive for nitrites. She also has large WBC and small amount of blood . Musculoskeletal: Normal range of motion.  Lymphadenopathy:     Cervical: No cervical adenopathy.  Skin:    General: Skin is warm and dry.  Neurological:     General: No focal deficit present.     Mental Status: She is alert and oriented to person, place, and time.     Cranial Nerves: No cranial nerve deficit.  Psychiatric:        Mood and Affect: Mood normal.        Behavior: Behavior normal.        Thought Content: Thought content normal.        Judgment: Judgment normal.   Assessment/Plan: 1. Urinary tract infection without hematuria, site unspecified Start ciprofloxacin 500mg  bid for 7 days. Send urine for culture and sensitivity and adjust antibiotics as indicates.  - ciprofloxacin (CIPRO) 500 MG tablet; Take 1 tablet (500 mg total) by mouth 2 (two) times daily.  Dispense: 14 tablet; Refill: 2  2. Vaginal burning - POCT Urinalysis Dipstick -  positive for infection. Advised she take OTC AZO as needed and as indicated for bladder pain and spasms.   3. Uncontrolled type 2 diabetes mellitus with hyperglycemia (HCC) - POCT HgB A1C 5.4 today. Continue all diabetic medication as prescribed   4. Irritable bowel syndrome with diarrhea - hyoscyamine (LEVSIN) 0.125 MG tablet; Take 1 tablet (0.125 mg total) by mouth every 6 (six) hours as needed for cramping.  Dispense: 120 tablet; Refill: 2  5. Essential hypertension Stable.   6. Acquired hypothyroidism Stable.   General Counseling: Ayanni verbalizes understanding of the findings of todays visit and agrees with plan of treatment. I have discussed any further diagnostic evaluation that may be needed or ordered today. We also reviewed her medications today. she has been encouraged to call the office with any questions or concerns that should arise related to todays visit.  Diabetes Counseling:  1. Addition of ACE inh/ ARB'S for nephroprotection. Microalbumin is updated  2. Diabetic foot care, prevention of complications. Podiatry consult 3. Exercise and lose weight.  4. Diabetic eye examination, Diabetic eye exam is updated  5. Monitor blood sugar closlely. nutrition counseling.  6. Sign and symptoms of hypoglycemia including shaking sweating,confusion and headaches.  This patient was seen by Leretha Pol FNP Collaboration with Dr Lavera Guise as a part of collaborative care agreement  Orders Placed This Encounter  Procedures  . POCT Urinalysis Dipstick  . POCT HgB A1C    Meds ordered this encounter  Medications  . ciprofloxacin (CIPRO) 500 MG tablet    Sig: Take 1 tablet (500 mg total) by mouth 2 (two) times daily.    Dispense:  14 tablet    Refill:  2    Order Specific Question:   Supervising Provider    Answer:   Lavera Guise [[id number]]  . hyoscyamine (LEVSIN) 0.125 MG tablet    Sig: Take 1 tablet (0.125  mg total) by mouth every 6 (six) hours as needed for cramping.     Dispense:  120 tablet    Refill:  2    Order Specific Question:   Supervising Provider    Answer:   Lavera Guise [[id number]]    Time spent: 46 Minutes      Dr Lavera Guise Internal medicine

## 2019-05-31 LAB — CULTURE, URINE COMPREHENSIVE

## 2019-06-22 ENCOUNTER — Other Ambulatory Visit: Payer: Self-pay | Admitting: Internal Medicine

## 2019-07-21 ENCOUNTER — Other Ambulatory Visit: Payer: Self-pay

## 2019-07-21 DIAGNOSIS — E1165 Type 2 diabetes mellitus with hyperglycemia: Secondary | ICD-10-CM

## 2019-07-21 MED ORDER — INSULIN LISPRO PROT & LISPRO (75-25 MIX) 100 UNIT/ML KWIKPEN: 24.0000 [IU] | PEN_INJECTOR | Freq: Two times a day (BID) | SUBCUTANEOUS | 3 refills | Status: DC

## 2019-08-16 ENCOUNTER — Inpatient Hospital Stay: Payer: Managed Care, Other (non HMO)

## 2019-08-16 ENCOUNTER — Inpatient Hospital Stay
Admission: EM | Admit: 2019-08-16 | Discharge: 2019-08-18 | DRG: 101 | Disposition: A | Discharge status: Home / Self Care | Payer: Managed Care, Other (non HMO) | Attending: Internal Medicine | Admitting: Internal Medicine

## 2019-08-16 ENCOUNTER — Emergency Department: Payer: Managed Care, Other (non HMO)

## 2019-08-16 ENCOUNTER — Other Ambulatory Visit: Payer: Self-pay

## 2019-08-16 DIAGNOSIS — R32 Unspecified urinary incontinence: Secondary | ICD-10-CM | POA: Diagnosis present

## 2019-08-16 DIAGNOSIS — Z20828 Contact with and (suspected) exposure to other viral communicable diseases: Secondary | ICD-10-CM | POA: Diagnosis present

## 2019-08-16 DIAGNOSIS — G40419 Other generalized epilepsy and epileptic syndromes, intractable, without status epilepticus: Secondary | ICD-10-CM | POA: Diagnosis present

## 2019-08-16 DIAGNOSIS — F319 Bipolar disorder, unspecified: Secondary | ICD-10-CM | POA: Diagnosis present

## 2019-08-16 DIAGNOSIS — Z833 Family history of diabetes mellitus: Secondary | ICD-10-CM

## 2019-08-16 DIAGNOSIS — Z888 Allergy status to other drugs, medicaments and biological substances status: Secondary | ICD-10-CM

## 2019-08-16 DIAGNOSIS — R569 Unspecified convulsions: Secondary | ICD-10-CM | POA: Diagnosis present

## 2019-08-16 DIAGNOSIS — Z79890 Hormone replacement therapy (postmenopausal): Secondary | ICD-10-CM

## 2019-08-16 DIAGNOSIS — Z881 Allergy status to other antibiotic agents status: Secondary | ICD-10-CM | POA: Diagnosis not present

## 2019-08-16 DIAGNOSIS — Z794 Long term (current) use of insulin: Secondary | ICD-10-CM

## 2019-08-16 DIAGNOSIS — E039 Hypothyroidism, unspecified: Secondary | ICD-10-CM | POA: Diagnosis present

## 2019-08-16 DIAGNOSIS — Z79899 Other long term (current) drug therapy: Secondary | ICD-10-CM | POA: Diagnosis not present

## 2019-08-16 DIAGNOSIS — D61818 Other pancytopenia: Secondary | ICD-10-CM | POA: Diagnosis present

## 2019-08-16 DIAGNOSIS — R7401 Elevation of levels of liver transaminase levels: Secondary | ICD-10-CM

## 2019-08-16 DIAGNOSIS — E785 Hyperlipidemia, unspecified: Secondary | ICD-10-CM | POA: Diagnosis present

## 2019-08-16 DIAGNOSIS — D696 Thrombocytopenia, unspecified: Secondary | ICD-10-CM | POA: Diagnosis present

## 2019-08-16 DIAGNOSIS — I10 Essential (primary) hypertension: Secondary | ICD-10-CM | POA: Diagnosis present

## 2019-08-16 DIAGNOSIS — D72819 Decreased white blood cell count, unspecified: Secondary | ICD-10-CM | POA: Diagnosis present

## 2019-08-16 DIAGNOSIS — F17210 Nicotine dependence, cigarettes, uncomplicated: Secondary | ICD-10-CM | POA: Diagnosis present

## 2019-08-16 DIAGNOSIS — E875 Hyperkalemia: Secondary | ICD-10-CM | POA: Diagnosis present

## 2019-08-16 DIAGNOSIS — R740 Nonspecific elevation of levels of transaminase and lactic acid dehydrogenase [LDH]: Secondary | ICD-10-CM | POA: Diagnosis present

## 2019-08-16 DIAGNOSIS — E119 Type 2 diabetes mellitus without complications: Secondary | ICD-10-CM | POA: Diagnosis present

## 2019-08-16 DIAGNOSIS — Z8249 Family history of ischemic heart disease and other diseases of the circulatory system: Secondary | ICD-10-CM

## 2019-08-16 LAB — URINALYSIS, COMPLETE (UACMP) WITH MICROSCOPIC
Bilirubin Urine: NEGATIVE
Glucose, UA: NEGATIVE mg/dL
Ketones, ur: 5 mg/dL — AB
Nitrite: POSITIVE — AB
Protein, ur: 100 mg/dL — AB
Specific Gravity, Urine: 1.013 (ref 1.005–1.030)
WBC, UA: >50 WBC/hpf — ABNORMAL HIGH (ref 0–5)
pH: 5 (ref 5.0–8.0)

## 2019-08-16 LAB — COMPREHENSIVE METABOLIC PANEL
ALT: 93 U/L — ABNORMAL HIGH (ref 0–44)
AST: 173 U/L — ABNORMAL HIGH (ref 15–41)
Albumin: 4 g/dL (ref 3.5–5.0)
Alkaline Phosphatase: 115 U/L (ref 38–126)
Anion gap: 15 (ref 5–15)
BUN: 13 mg/dL (ref 6–20)
CO2: 20 mmol/L — ABNORMAL LOW (ref 22–32)
Calcium: 9.1 mg/dL (ref 8.9–10.3)
Chloride: 104 mmol/L (ref 98–111)
Creatinine, Ser: 1.03 mg/dL — ABNORMAL HIGH (ref 0.44–1.00)
GFR calc Af Amer: >60 mL/min (ref >60)
GFR calc non Af Amer: >60 mL/min (ref >60)
Glucose, Bld: 190 mg/dL — ABNORMAL HIGH (ref 70–99)
Potassium: 5.1 mmol/L (ref 3.5–5.1)
Sodium: 139 mmol/L (ref 135–145)
Total Bilirubin: 0.9 mg/dL (ref 0.3–1.2)
Total Protein: 8.7 g/dL — ABNORMAL HIGH (ref 6.5–8.1)

## 2019-08-16 LAB — URINE DRUG SCREEN, QUALITATIVE (ARMC ONLY)
Amphetamines, Ur Screen: NOT DETECTED
Barbiturates, Ur Screen: NOT DETECTED
Benzodiazepine, Ur Scrn: POSITIVE — AB
Cannabinoid 50 Ng, Ur ~~LOC~~: NOT DETECTED
Cocaine Metabolite,Ur ~~LOC~~: NOT DETECTED
MDMA (Ecstasy)Ur Screen: NOT DETECTED
Methadone Scn, Ur: NOT DETECTED
Opiate, Ur Screen: NOT DETECTED
Phencyclidine (PCP) Ur S: NOT DETECTED
Tricyclic, Ur Screen: NOT DETECTED

## 2019-08-16 LAB — CBC
HCT: 38 % (ref 36.0–46.0)
Hemoglobin: 12.2 g/dL (ref 12.0–15.0)
MCH: 32.4 pg (ref 26.0–34.0)
MCHC: 32.1 g/dL (ref 30.0–36.0)
MCV: 100.8 fL — ABNORMAL HIGH (ref 80.0–100.0)
Platelets: 65 10*3/uL — ABNORMAL LOW (ref 150–400)
RBC: 3.77 MIL/uL — ABNORMAL LOW (ref 3.87–5.11)
RDW: 12.2 % (ref 11.5–15.5)
WBC: 3.9 10*3/uL — ABNORMAL LOW (ref 4.0–10.5)
nRBC: 0 % (ref 0.0–0.2)

## 2019-08-16 LAB — GLUCOSE, CAPILLARY
Glucose-Capillary: 170 mg/dL — ABNORMAL HIGH (ref 70–99)
Glucose-Capillary: 189 mg/dL — ABNORMAL HIGH (ref 70–99)
Glucose-Capillary: 184 mg/dL — ABNORMAL HIGH (ref 70–99)

## 2019-08-16 LAB — CK: Total CK: 196 U/L (ref 38–234)

## 2019-08-16 LAB — PROTIME-INR
INR: 1.3 — ABNORMAL HIGH (ref 0.8–1.2)
Prothrombin Time: 16 seconds — ABNORMAL HIGH (ref 11.4–15.2)

## 2019-08-16 LAB — SARS CORONAVIRUS 2 (TAT 6-24 HRS): SARS Coronavirus 2: NEGATIVE

## 2019-08-16 LAB — ETHANOL: Alcohol, Ethyl (B): <10 mg/dL (ref <10)

## 2019-08-16 LAB — AMMONIA: Ammonia: 24 umol/L (ref 9–35)

## 2019-08-16 LAB — TSH: TSH: 0.775 u[IU]/mL (ref 0.350–4.500)

## 2019-08-16 MED ORDER — ACETAMINOPHEN 325 MG PO TABS
650.0000 mg | ORAL_TABLET | Freq: Four times a day (QID) | ORAL | Status: DC | PRN
  Administered 2019-08-16: 650 mg via ORAL
  Filled 2019-08-16: qty 2

## 2019-08-16 MED ORDER — LORAZEPAM 2 MG/ML IJ SOLN
INTRAMUSCULAR | Status: AC
  Filled 2019-08-16: qty 1

## 2019-08-16 MED ORDER — LORAZEPAM 2 MG/ML IJ SOLN: 2.0000 mg | INTRAMUSCULAR | Status: DC | PRN

## 2019-08-16 MED ORDER — LEVETIRACETAM 500 MG PO TABS
500.0000 mg | ORAL_TABLET | Freq: Two times a day (BID) | ORAL | Status: DC
  Administered 2019-08-16 – 2019-08-18 (×5): 500 mg via ORAL
  Filled 2019-08-16 (×6): qty 1

## 2019-08-16 MED ORDER — LORAZEPAM 2 MG/ML IJ SOLN
1.0000 mg | Freq: Once | INTRAMUSCULAR | Status: AC
  Administered 2019-08-16: 05:00:00 1 mg via INTRAVENOUS

## 2019-08-16 MED ORDER — HEPARIN SODIUM (PORCINE) 5000 UNIT/ML IJ SOLN
5000.0000 [IU] | Freq: Three times a day (TID) | INTRAMUSCULAR | Status: DC
  Administered 2019-08-16 – 2019-08-18 (×6): 5000 [IU] via SUBCUTANEOUS
  Filled 2019-08-16 (×6): qty 1

## 2019-08-16 MED ORDER — SODIUM CHLORIDE 0.9 % IV BOLUS
1000.0000 mL | Freq: Once | INTRAVENOUS | Status: AC
  Administered 2019-08-16: 1000 mL via INTRAVENOUS

## 2019-08-16 MED ORDER — HYOSCYAMINE SULFATE 0.125 MG PO TABS
0.1250 mg | ORAL_TABLET | Freq: Four times a day (QID) | ORAL | Status: DC | PRN
  Filled 2019-08-16: qty 1

## 2019-08-16 MED ORDER — GADOBUTROL 1 MMOL/ML IV SOLN
7.0000 mL | Freq: Once | INTRAVENOUS | Status: AC | PRN
  Administered 2019-08-16: 7 mL via INTRAVENOUS

## 2019-08-16 MED ORDER — INSULIN ASPART 100 UNIT/ML ~~LOC~~ SOLN
0.0000 [IU] | Freq: Three times a day (TID) | SUBCUTANEOUS | Status: DC
  Administered 2019-08-16: 17:00:00 2 [IU] via SUBCUTANEOUS
  Administered 2019-08-17 (×2): 1 [IU] via SUBCUTANEOUS
  Administered 2019-08-17: 2 [IU] via SUBCUTANEOUS
  Administered 2019-08-18: 1 [IU] via SUBCUTANEOUS
  Administered 2019-08-18: 2 [IU] via SUBCUTANEOUS
  Filled 2019-08-16 (×6): qty 1

## 2019-08-16 MED ORDER — LORAZEPAM 2 MG PO TABS: 0.0000 mg | ORAL_TABLET | Freq: Two times a day (BID) | ORAL | Status: DC

## 2019-08-16 MED ORDER — CHLORDIAZEPOXIDE HCL 25 MG PO CAPS
50.0000 mg | ORAL_CAPSULE | Freq: Once | ORAL | Status: AC
  Administered 2019-08-16: 50 mg via ORAL
  Filled 2019-08-16: qty 2

## 2019-08-16 MED ORDER — LOSARTAN POTASSIUM 50 MG PO TABS
50.0000 mg | ORAL_TABLET | Freq: Every day | ORAL | Status: DC
  Administered 2019-08-16 – 2019-08-18 (×2): 50 mg via ORAL
  Filled 2019-08-16 (×3): qty 1

## 2019-08-16 MED ORDER — INSULIN ASPART 100 UNIT/ML ~~LOC~~ SOLN: 0.0000 [IU] | Freq: Every day | SUBCUTANEOUS | Status: DC

## 2019-08-16 MED ORDER — LEVOTHYROXINE SODIUM 50 MCG PO TABS
50.0000 ug | ORAL_TABLET | Freq: Every day | ORAL | Status: DC
  Administered 2019-08-17 – 2019-08-18 (×2): 50 ug via ORAL
  Filled 2019-08-16 (×2): qty 1

## 2019-08-16 MED ORDER — LORAZEPAM 2 MG/ML IJ SOLN: 0.0000 mg | Freq: Four times a day (QID) | INTRAMUSCULAR | Status: AC

## 2019-08-16 MED ORDER — SODIUM CHLORIDE 0.9 % IV SOLN
1.0000 g | Freq: Every day | INTRAVENOUS | Status: DC
  Administered 2019-08-16 – 2019-08-17 (×2): 1 g via INTRAVENOUS
  Filled 2019-08-16 (×2): qty 10
  Filled 2019-08-16: qty 1

## 2019-08-16 MED ORDER — VITAMIN B-1 100 MG PO TABS
100.0000 mg | ORAL_TABLET | Freq: Every day | ORAL | Status: DC
  Administered 2019-08-16 – 2019-08-18 (×3): 100 mg via ORAL
  Filled 2019-08-16 (×4): qty 1

## 2019-08-16 MED ORDER — THIAMINE HCL 100 MG/ML IJ SOLN
100.0000 mg | Freq: Every day | INTRAMUSCULAR | Status: DC
  Filled 2019-08-16: qty 2

## 2019-08-16 MED ORDER — LORAZEPAM 2 MG/ML IJ SOLN: 0.0000 mg | Freq: Two times a day (BID) | INTRAMUSCULAR | Status: DC

## 2019-08-16 MED ORDER — HYOSCYAMINE SULFATE 0.125 MG PO TBDP
0.1250 mg | ORAL_TABLET | Freq: Four times a day (QID) | ORAL | Status: DC | PRN
  Filled 2019-08-16: qty 1

## 2019-08-16 MED ORDER — LORAZEPAM 2 MG PO TABS: 0.0000 mg | ORAL_TABLET | Freq: Four times a day (QID) | ORAL | Status: AC

## 2019-08-16 MED ORDER — DOCUSATE SODIUM 100 MG PO CAPS: 100.0000 mg | ORAL_CAPSULE | Freq: Two times a day (BID) | ORAL | Status: DC | PRN

## 2019-08-16 MED ORDER — AMLODIPINE BESYLATE 5 MG PO TABS
5.0000 mg | ORAL_TABLET | Freq: Every day | ORAL | Status: DC
  Administered 2019-08-16 – 2019-08-18 (×3): 5 mg via ORAL
  Filled 2019-08-16 (×3): qty 1

## 2019-08-16 MED ORDER — ROPINIROLE HCL 0.25 MG PO TABS
0.2500 mg | ORAL_TABLET | Freq: Every day | ORAL | Status: DC
  Administered 2019-08-16 – 2019-08-17 (×2): 0.25 mg via ORAL
  Filled 2019-08-16 (×3): qty 1

## 2019-08-16 NOTE — ED Notes (Signed)
Per dr. Alfred Levins give librium now and repeat ciwa at 0700.

## 2019-08-16 NOTE — ED Notes (Signed)
Pt to ct scan with this RN.

## 2019-08-16 NOTE — H&P (Signed)
[place] [facility] [place] NAME: Abigail Rice    MR#:  [id number]  DATE OF BIRTH:  Apr 16, 1966  DATE OF ADMISSION:  08/16/2019  PRIMARY CARE PHYSICIAN: Lavera Guise, MD   REQUESTING/REFERRING PHYSICIAN: Alfred Levins  CHIEF COMPLAINT:   Chief Complaint  Patient presents with  . Seizures    HISTORY OF PRESENT ILLNESS: Abigail Rice  is a 53 y.o. female with a known history of bipolar disorder, depression, diabetes, hyperlipidemia, hypertension-came to emergency room as her husband called EMS due to seizures. Patient also had some tongue bite and loss of urinary control during the episode.  She does not remember when that happened and how she transported to emergency room. She denies any other complaints.  She denies any headache or focal weakness now. CT scan head was done and hospital service was called for admitting for seizures.  PAST MEDICAL HISTORY:   Past Medical History:  Diagnosis Date  . Bipolar disorder ([place])   . Depression   . Diabetes mellitus without complication ([place])   . Hyperlipidemia   . Hypertension     PAST SURGICAL HISTORY:  Past Surgical History:  Procedure Laterality Date  . CESAREAN SECTION    . COLONOSCOPY WITH PROPOFOL N/A 03/29/2016   Procedure: COLONOSCOPY WITH PROPOFOL;  Surgeon: Josefine Class, MD;  Location: [facility] ENDOSCOPY;  Service: Endoscopy;  Laterality: N/A;  . TUBAL LIGATION      SOCIAL HISTORY:  Social History   Tobacco Use  . Smoking status: Current Every Day Smoker    Types: Cigarettes  . Smokeless tobacco: Never Used  Substance Use Topics  . Alcohol use: Yes    FAMILY HISTORY:  Family History  Problem Relation Age of Onset  . Diabetes Mother   . Hypertension Mother     DRUG ALLERGIES:  Allergies  Allergen Reactions  . Ace Inhibitors   . Ciprofloxacin   . Lisinopril   . Vytorin [Ezetimibe-Simvastatin]     REVIEW OF SYSTEMS:   CONSTITUTIONAL: No fever, fatigue or weakness.  EYES:  No blurred or double vision.  EARS, NOSE, AND THROAT: No tinnitus or ear pain.  RESPIRATORY: No cough, shortness of breath, wheezing or hemoptysis.  CARDIOVASCULAR: No chest pain, orthopnea, edema.  GASTROINTESTINAL: No nausea, vomiting, diarrhea or abdominal pain.  GENITOURINARY: No dysuria, hematuria.  ENDOCRINE: No polyuria, nocturia,  HEMATOLOGY: No anemia, easy bruising or bleeding SKIN: No rash or lesion. MUSCULOSKELETAL: No joint pain or arthritis.   NEUROLOGIC: No tingling, numbness, weakness.  PSYCHIATRY: No anxiety or depression.   MEDICATIONS AT HOME:  Prior to Admission medications   Medication Sig Start Date End Date Taking? Authorizing Provider  amLODipine (NORVASC) 5 MG tablet Take 1 tablet (5 mg total) by mouth daily. 12/11/18  Yes Boscia, Greer Ee, NP  hyoscyamine (LEVSIN) 0.125 MG tablet Take 1 tablet (0.125 mg total) by mouth every 6 (six) hours as needed for cramping. 05/28/19  Yes Boscia, Heather E, NP  insulin lispro (HUMALOG KWIKPEN) 100 UNIT/ML KiwkPen Inject into the skin. Use at lunch with sliding scale may use up to 10 units [place]   Yes [provider]  Insulin Lispro Prot & Lispro (HUMALOG MIX 75/25 KWIKPEN) (75-25) 100 UNIT/ML Kwikpen Inject 24 Units into the skin 2 (two) times daily. 07/21/19  Yes Ronnell Freshwater, NP  levothyroxine (SYNTHROID, LEVOTHROID) 50 MCG tablet Take 1 tablet (50 mcg total) by mouth daily before breakfast. 12/11/18  Yes Boscia, Greer Ee, NP  losartan (COZAAR)  50 MG tablet Take 1 tablet (50 mg total) by mouth daily. 12/11/18  Yes Ronnell Freshwater, NP  rOPINIRole (REQUIP) 0.25 MG tablet Take 1 to 2 tablets po QHS prn 12/11/18  Yes Boscia, Heather E, NP  Insulin Pen Needle (PEN NEEDLES) 32G X 4 MM MISC 1 each by Does not apply route daily. 03/19/19   Ronnell Freshwater, NP  ONETOUCH ULTRA test strip STRIPS FOR BLOOD SUGAR TESTING 1 TIME DAILY. DX 250.00 06/23/19   Ronnell Freshwater, NP      PHYSICAL EXAMINATION:   VITAL SIGNS: Blood  pressure 112/79, pulse 97, temperature 98.4 F (36.9 C), temperature source Oral, resp. rate (!) 24, height 5' (1.524 m), weight 74.7 kg, last menstrual period 11/07/2015, SpO2 100 %.  GENERAL:  53 y.o.-year-old patient lying in the bed with no acute distress.  EYES: Pupils equal, round, reactive to light and accommodation. No scleral icterus. Extraocular muscles intact.  HEENT: Head atraumatic, normocephalic. Oropharynx and nasopharynx clear.  Signs of tongue bite. NECK:  Supple, no jugular venous distention. No thyroid enlargement, no tenderness.  LUNGS: Normal breath sounds bilaterally, no wheezing, rales,rhonchi or crepitation. No use of accessory muscles of respiration.  CARDIOVASCULAR: S1, S2 normal. No murmurs, rubs, or gallops.  ABDOMEN: Soft, nontender, nondistended. Bowel sounds present. No organomegaly or mass.  EXTREMITIES: No pedal edema, cyanosis, or clubbing.  NEUROLOGIC: Cranial nerves II through XII are intact. Muscle strength 5/5 in all extremities. Sensation intact. Gait not checked.  PSYCHIATRIC: The patient is alert and oriented x 3.  SKIN: No obvious rash, lesion, or ulcer.   LABORATORY PANEL:   CBC Recent Labs  Lab 08/16/19 0518  WBC 3.9*  HGB 12.2  HCT 38.0  PLT 65*  MCV 100.8*  MCH 32.4  MCHC 32.1  RDW 12.2   ------------------------------------------------------------------------------------------------------------------  Chemistries  Recent Labs  Lab 08/16/19 0518  NA 139  K 5.1  CL 104  CO2 20*  GLUCOSE 190*  BUN 13  CREATININE 1.03*  CALCIUM 9.1  AST 173*  ALT 93*  ALKPHOS 115  BILITOT 0.9   ------------------------------------------------------------------------------------------------------------------ estimated creatinine clearance is 57 mL/min (A) (by C-G formula based on SCr of 1.03 mg/dL (H)). ------------------------------------------------------------------------------------------------------------------ Recent Labs     08/16/19 0600  TSH 0.775     Coagulation profile Recent Labs  Lab 08/16/19 0518  INR 1.3*   ------------------------------------------------------------------------------------------------------------------- No results for input(s): DDIMER in the last 72 hours. -------------------------------------------------------------------------------------------------------------------  Cardiac Enzymes No results for input(s): CKMB, TROPONINI, MYOGLOBIN in the last 168 hours.  Invalid input(s): CK ------------------------------------------------------------------------------------------------------------------ Invalid input(s): POCBNP  ---------------------------------------------------------------------------------------------------------------  Urinalysis    Component Value Date/Time   COLORURINE YELLOW (A) 08/16/2019 1317   APPEARANCEUR CLOUDY (A) 08/16/2019 1317   APPEARANCEUR Clear 08/07/2018 0915   LABSPEC 1.013 08/16/2019 1317   PHURINE 5.0 08/16/2019 1317   GLUCOSEU NEGATIVE 08/16/2019 1317   HGBUR MODERATE (A) 08/16/2019 1317   BILIRUBINUR NEGATIVE 08/16/2019 1317   BILIRUBINUR negative 05/28/2019 0945   BILIRUBINUR Negative 08/07/2018 0915   KETONESUR 5 (A) 08/16/2019 1317   PROTEINUR 100 (A) 08/16/2019 1317   UROBILINOGEN 0.2 05/28/2019 0945   NITRITE POSITIVE (A) 08/16/2019 1317   LEUKOCYTESUR LARGE (A) 08/16/2019 1317     RADIOLOGY: Ct Head Wo Contrast  Result Date: 08/16/2019 CLINICAL DATA:  Seizure, new, found on floor EXAM: CT HEAD WITHOUT CONTRAST CT CERVICAL SPINE WITHOUT CONTRAST TECHNIQUE: Multidetector CT imaging of the head and cervical spine was performed following the standard protocol without intravenous contrast. Multiplanar  CT image reconstructions of the cervical spine were also generated. COMPARISON:  None. FINDINGS: CT HEAD FINDINGS Brain: No evidence of acute infarction, hemorrhage, hydrocephalus, extra-axial collection or mass lesion/mass effect.  Vascular: Atherosclerotic calcification Skull: Negative for fracture Sinuses/Orbits: Negative Other: Incidental multiple small calculi within the right parotid gland which is smaller and increased in density, presumably generalized scarring. CT CERVICAL SPINE FINDINGS Alignment: Negative for listhesis Skull base and vertebrae: No acute fracture Soft tissues and spinal canal: No prevertebral fluid or swelling. No visible canal hematoma. Disc levels:  Mid cervical disc narrowing and ventral ridging. Upper chest: Negative IMPRESSION: No acute intracranial or cervical spine finding. No evidence of injury and no explanation for seizure. Electronically Signed   By: Monte Fantasia M.D.   On: 08/16/2019 05:48   Ct Cervical Spine Wo Contrast  Result Date: 08/16/2019 CLINICAL DATA:  Seizure, new, found on floor EXAM: CT HEAD WITHOUT CONTRAST CT CERVICAL SPINE WITHOUT CONTRAST TECHNIQUE: Multidetector CT imaging of the head and cervical spine was performed following the standard protocol without intravenous contrast. Multiplanar CT image reconstructions of the cervical spine were also generated. COMPARISON:  None. FINDINGS: CT HEAD FINDINGS Brain: No evidence of acute infarction, hemorrhage, hydrocephalus, extra-axial collection or mass lesion/mass effect. Vascular: Atherosclerotic calcification Skull: Negative for fracture Sinuses/Orbits: Negative Other: Incidental multiple small calculi within the right parotid gland which is smaller and increased in density, presumably generalized scarring. CT CERVICAL SPINE FINDINGS Alignment: Negative for listhesis Skull base and vertebrae: No acute fracture Soft tissues and spinal canal: No prevertebral fluid or swelling. No visible canal hematoma. Disc levels:  Mid cervical disc narrowing and ventral ridging. Upper chest: Negative IMPRESSION: No acute intracranial or cervical spine finding. No evidence of injury and no explanation for seizure. Electronically Signed   By: Monte Fantasia M.D.   On: 08/16/2019 05:48   Mr Jeri Cos [id number] Contrast  Result Date: 08/16/2019 CLINICAL DATA:  Seizure EXAM: MRI HEAD WITHOUT AND WITH CONTRAST TECHNIQUE: Multiplanar, multiecho pulse sequences of the brain and surrounding structures were obtained without and with intravenous contrast. CONTRAST:  7 mL Gadovist IV COMPARISON:  CT head 08/16/2019 FINDINGS: Brain: Ventricle size normal. Scattered small hyperintensities in the deep white matter both cerebral hemispheres. Negative for hemorrhage or mass. Normal enhancement postcontrast administration. Vascular: Normal arterial flow voids Skull and upper cervical spine: Negative Sinuses/Orbits: Negative Other: None IMPRESSION: No acute intracranial abnormality. Mild chronic white matter changes in the deep white matter bilaterally likely due to chronic microvascular ischemia. Electronically Signed   By: Franchot Gallo M.D.   On: 08/16/2019 14:30    EKG: Orders placed or performed during the hospital encounter of 08/16/19  . ED EKG  . ED EKG  . EKG 12-Lead  . EKG 12-Lead    IMPRESSION AND PLAN:  *Seizure IV lorazepam as needed for seizures. Check CK.  Check UA and TSH.  COVID test is sent. Check MRI brain. Neurology consult. Patient denies excessive use of coffee, energy drinks, loss of sleep, alcohol.  *Hypertension Continue regular losartan.  *Diabetes We will keep on sliding scale coverage  *Hypothyroidism Continue levothyroxine.  *Hyperkalemia Likely due to seizures, IV fluid boluses given that should take care of that.  *Active smoking Consult to quit smoking for 4 minutes and offered nicotine patch.   All the records are reviewed and case discussed with ED provider. Management plans discussed with the patient, family and they are in agreement.  CODE STATUS:Full.    Code Status Orders  (  From admission, onward)         Start     Ordered   08/16/19 1130  Full code  Continuous     08/16/19 1129        Code  Status History    Date Active Date Inactive Code Status Order ID Comments User Context   08/16/2019 0635 08/16/2019 1129 Full Code [id number]  Rudene Re, MD ED   Advance Care Planning Activity       TOTAL TIME TAKING CARE OF THIS PATIENT: 55 minutes.    Vaughan Basta M.D on 08/16/2019   Between 7am to 6pm - Pager - [phone]  After 6pm go to [email or web address] - password [email or web address] [place] [facility]  Office  [phone]  CC: Primary care physician; Lavera Guise, MD   Note: This dictation was prepared with Dragon dictation along with smaller phrase technology. Any transcriptional errors that result from this process are unintentional.

## 2019-08-16 NOTE — Consult Note (Signed)
Neurology [place] [facility]   Referring Physician: Vaughan Basta, *  Chief Complaint/Reason for Consult:  I have been asked to see the patient in neurological consultation to render advice and opinion regarding seizure-like activity.  HPI: [name] is a 53 y.o.  female with a history significant for bipolar, depression, DM, HTN and HLD who presents today with AMS. History provided by patient and supplemented by review of EMR.  Patient was found on the floor by her husband yesterday evening/early morning. It's not clear how long she was down for. She said she last saw her husband last night around 9-10pm when she went to bed. Patient was found early morning and EMS was called. She arrived to the ED around 5 AM. When EMS arrived, patient was reportedly "posturing, unresponsive, shaking, incontinent of urine and with tongue trauma.  She received 2 mg of Versed." This has never occurred before.   In the ED, vitals have been significant for T97.5  HR 90s-100s  SBP 110s-140s. Labs are significant for mildly elevated Cr 1.03, INR 1.3, AST 173, ALT 93, Ammonia 24, PLT 65. HCT w/o contrast revealed no acute findings. All images independently visualized.  Of note, patient admits to drinking 2-3 "light beers" every night. She had alcohol last night before this event. Never had alcohol withdrawal seizures before.    Past Medical History:  Diagnosis Date  . Bipolar disorder ([place])   . Depression   . Diabetes mellitus without complication ([place])   . Hyperlipidemia   . Hypertension     Past Surgical History:  Procedure Laterality Date  . CESAREAN SECTION    . COLONOSCOPY WITH PROPOFOL N/A 03/29/2016   Procedure: COLONOSCOPY WITH PROPOFOL;  Surgeon: Josefine Class, MD;  Location: [facility] ENDOSCOPY;  Service: Endoscopy;  Laterality: N/A;  . TUBAL LIGATION     Allergies  Allergen Reactions  . Ace Inhibitors   . Ciprofloxacin   . Lisinopril   . Vytorin  [Ezetimibe-Simvastatin]    Prior to Admission medications   Medication Sig Start Date End Date Taking? Authorizing Provider  amLODipine (NORVASC) 5 MG tablet Take 1 tablet (5 mg total) by mouth daily. 12/11/18  Yes Boscia, Greer Ee, NP  hyoscyamine (LEVSIN) 0.125 MG tablet Take 1 tablet (0.125 mg total) by mouth every 6 (six) hours as needed for cramping. 05/28/19  Yes Boscia, Heather E, NP  insulin lispro (HUMALOG KWIKPEN) 100 UNIT/ML KiwkPen Inject into the skin. Use at lunch with sliding scale may use up to 10 units [place]   Yes [provider]  Insulin Lispro Prot & Lispro (HUMALOG MIX 75/25 KWIKPEN) (75-25) 100 UNIT/ML Kwikpen Inject 24 Units into the skin 2 (two) times daily. 07/21/19  Yes Ronnell Freshwater, NP  levothyroxine (SYNTHROID, LEVOTHROID) 50 MCG tablet Take 1 tablet (50 mcg total) by mouth daily before breakfast. 12/11/18  Yes Boscia, Heather E, NP  losartan (COZAAR) 50 MG tablet Take 1 tablet (50 mg total) by mouth daily. 12/11/18  Yes Ronnell Freshwater, NP  rOPINIRole (REQUIP) 0.25 MG tablet Take 1 to 2 tablets po QHS prn 12/11/18  Yes Boscia, Heather E, NP  Insulin Pen Needle (PEN NEEDLES) 32G X 4 MM MISC 1 each by Does not apply route daily. 03/19/19   Ronnell Freshwater, NP  ONETOUCH ULTRA test strip STRIPS FOR BLOOD SUGAR TESTING 1 TIME DAILY. DX 250.00 06/23/19   Ronnell Freshwater, NP   Family History  Problem Relation Age of Onset  .  Diabetes Mother   . Hypertension Mother    Social History   Socioeconomic History  . Marital status: Married    Spouse name: Not on file  . Number of children: Not on file  . Years of education: Not on file  . Highest education level: Not on file  Occupational History  . Not on file  Social Needs  . Financial resource strain: Not on file  . Food insecurity    Worry: Not on file    Inability: Not on file  . Transportation needs    Medical: Not on file    Non-medical: Not on file  Tobacco Use  . Smoking status: Current Every  Day Smoker    Types: Cigarettes  . Smokeless tobacco: Never Used  Substance and Sexual Activity  . Alcohol use: Yes  . Drug use: No  . Sexual activity: Not on file  Lifestyle  . Physical activity    Days per week: Not on file    Minutes per session: Not on file  . Stress: Not on file  Relationships  . Social Herbalist on phone: Not on file    Gets together: Not on file    Attends religious service: Not on file    Active member of club or organization: Not on file    Attends meetings of clubs or organizations: Not on file    Relationship status: Not on file  . Intimate partner violence    Fear of current or ex partner: Not on file    Emotionally abused: Not on file    Physically abused: Not on file    Forced sexual activity: Not on file  Other Topics Concern  . Not on file  Social History Narrative  . Not on file    Continuous Infusions: Scheduled Meds:  . LORazepam  0-4 mg Intravenous Q6H   Or  . LORazepam  0-4 mg Oral Q6H  . [START ON 08/18/2019] LORazepam  0-4 mg Intravenous Q12H   Or  . [START ON 08/18/2019] LORazepam  0-4 mg Oral Q12H  . thiamine  100 mg Oral Daily   Or  . thiamine  100 mg Intravenous Daily   PRN Meds: LORazepam   Review of Systems Pertinent items noted in HPI and remainder of comprehensive ROS otherwise negative.   Objective: Temp:  [97.5 F (36.4 C)] 97.5 F (36.4 C) (08/24 0513) Pulse Rate:  [99-123] 104 (08/24 0810) Resp:  [16-25] 17 (08/24 0810) BP: (124-142)/(84-110) 126/87 (08/24 0810) SpO2:  [98 %-100 %] 100 % (08/24 0810) Weight:  [90 kg] 90 kg (08/24 0512) No intake or output data in the 24 hours ending 08/16/19 0936  Wt Readings from Last 3 Encounters:  08/16/19 90 kg  05/28/19 83.1 kg  12/11/18 85.9 kg      Physical Exam:   GENERAL:  No acute distress.  EYES:   Pupils: pupils equally round, reactive to light.  ENT:   Throat: oropharynx clear.  CARDIOVASCULAR: tachycardic  RESPIRATORY:  normal work of  breathing  GASTROINTESTINAL:   Abdomen: benign, bowel sounds present.  SKIN:   Inspection: well perfused, no edema. Dry blood around mouth  MENTAL STATUS EXAM:    Orientation: Alert and oriented to person, place and time.  Memory: Cooperative, follows commands well. Recent and remote memory decreased.. Attention, concentration: Attention span and concentration are normal.  Language: Speech is clear and language is normal.  Fund of knowledge: Aware of current events, vocabulary appropriate for  patient age.   CRANIAL NERVES:    CN 2 (Optic): Visual fields intact to confrontation CN 3,4,6 (EOM): Pupils equal and reactive to light. Full extraocular eye movement without nystagmus.  CN 5 (Trigeminal): Facial sensation is normal, no weakness of masticatory muscles.  CN 7 (Facial): No facial weakness or asymmetry.  CN 8 (Auditory): Auditory acuity grossly normal.  CN 9,10 (Glossophar): The uvula is midline, the palate elevates symmetrically.  CN 11 (spinal access): Normal sternocleidomastoid and trapezius strength.  CN 12 (Hypoglossal): The tongue is midline. No atrophy or fasciculations.Marland Kitchen   MOTOR:  Muscle Strength: Strength - 5/5 and symmetric in the upper and lower extremities, no pronation or drift. Muscle Tone: Tone and muscle bulk are normal in the upper and lower extremities.   REFLEXES:   DTRs - 1+ and symmetrical in all four extremities, plantar responses are muted bilaterally.   COORDINATION:   Intact finger-to-nose. Intentional tremors noted in BUE   SENSATION:   Intact to light touch   GAIT: Deferred   Labs: Recent Labs  Lab 08/16/19 0518  WBC 3.9*  HGB 12.2  HCT 38.0  PLT 65*  MCV 100.8*   Recent Labs  Lab 08/16/19 0518  NA 139  K 5.1  CL 104  CO2 20*  BUN 13  CREATININE 1.03*  CALCIUM 9.1      Significant Diagnostic Studies: All images independently visualized.   Impression: [name] is a 53 y.o.  female with a history significant for  bipolar/depression and alcohol abuse who presents to the ED after being found on the floor for unknown period of time. She was found with seizure-like activity and was given Versed. This is the first event; however, it's unclear how many seizures she had last night until being found early this morning. She had tongue biting and urinary incontinence. Exam right now is non-focal.   Principal Problem:   Seizure ([place]) Active Problems:   Seizures ([place])  Recommendations: - Recommend MRI brain w/wo contrast - Will start Keppra 500 mg BID given concerns that she had multiple events last night  - Will get routine EEG - Frequent neuro checks  - Seizure precautions: do not drive, ride dirt bike, swim alone, or take part in any life threatening activity unsupervised. Avoid several activities that can lower your seizure threshold, including lack of sleep, alcohol consumption, dehydration, missed meals, and missed medication doses.  - Please call neurology for any further questions or concerns.   It's been a pleasure participating in this patient's care. Please don't hesitate to call with any further questions or concerns.   Laurene Footman, MD Neurology

## 2019-08-16 NOTE — ED Notes (Signed)
Husband at bedside, husband states he was in the living room and he heard a fall in bathroom/bedroom. Pt's spouse states he found pt on the floor "shaking all over like she was cold". Husband states pt would not respond to him verbally. Pt is currently alert to place, self, less postictal. Pt denies pain.

## 2019-08-16 NOTE — ED Notes (Signed)
No ativan given at this time d/t CIWA score 2

## 2019-08-16 NOTE — ED Notes (Signed)
Admitting provider request ED provider place order - Dr Kerman Passey placed covid swab order

## 2019-08-16 NOTE — ED Notes (Signed)
Assisted patient to the toilet.

## 2019-08-16 NOTE — ED Notes (Signed)
Pt assisted onto bedpan.

## 2019-08-16 NOTE — ED Notes (Signed)
Pt cleansed in ct of incontinent urine with assist of ct tech jeff.

## 2019-08-16 NOTE — ED Notes (Signed)
Pt states she needs about 10 more minutes on bedpan.

## 2019-08-16 NOTE — ED Notes (Signed)
ED TO INPATIENT HANDOFF REPORT  ED Nurse Name and Phone #: [phone]  S Name/Age/Gender Abigail Rice 53 y.o. female Room/Bed: ED04A/ED04A  Code Status   Code Status: Full Code  Home/SNF/Other Home Patient oriented to: self, place, time and situation Is this baseline? Yes   Triage Complete: Triage complete  Chief Complaint seizures  Triage Note Pt found in floor by husband with ams. Per ems pt with decerebrate posturing and shaking on their arrival. Pt arrives with oral trauma, loss ob bladder. Pt with some response to instructions, md at bedside, ems with fsbs of 212, 18g in lac.    Allergies Allergies  Allergen Reactions  . Ace Inhibitors   . Ciprofloxacin   . Lisinopril   . Vytorin [Ezetimibe-Simvastatin]     Level of Care/Admitting Diagnosis ED Disposition    ED Disposition Condition [facility] Area: [place] [[address]120]  Level of Care: Med-Surg [16]  Covid Evaluation: Asymptomatic Screening Protocol (No Symptoms)  Diagnosis: Seizures [facility]) [id number]  Admitting Physician: Vaughan Basta [phone]  Attending Physician: Vaughan Basta [phone]  Estimated length of stay: past midnight tomorrow  Certification:: I certify this patient will need inpatient services for at least 2 midnights  PT Class (Do Not Modify): Inpatient [101]  PT Acc Code (Do Not Modify): Private [1]       B Medical/Surgery History Past Medical History:  Diagnosis Date  . Bipolar disorder ([place])   . Depression   . Diabetes mellitus without complication ([place])   . Hyperlipidemia   . Hypertension    Past Surgical History:  Procedure Laterality Date  . CESAREAN SECTION    . COLONOSCOPY WITH PROPOFOL N/A 03/29/2016   Procedure: COLONOSCOPY WITH PROPOFOL;  Surgeon: Josefine Class, MD;  Location: [facility] ENDOSCOPY;  Service: Endoscopy;  Laterality: N/A;  . TUBAL LIGATION       A IV Location/Drains/Wounds Patient  Lines/Drains/Airways Status   Active Line/Drains/Airways    Name:   Placement date:   Placement time:   Site:   Days:   Peripheral IV 08/16/19 Left Antecubital   08/16/19    -    Antecubital   less than 1          Intake/Output Last 24 hours No intake or output data in the 24 hours ending 08/16/19 1009  Labs/Imaging Results for orders placed or performed during the hospital encounter of 08/16/19 (from the past 48 hour(s))  Glucose, capillary     Status: Abnormal   Collection Time: 08/16/19  5:12 AM  Result Value Ref Range   Glucose-Capillary 170 (H) 70 - 99 mg/dL  Comprehensive metabolic panel     Status: Abnormal   Collection Time: 08/16/19  5:18 AM  Result Value Ref Range   Sodium 139 135 - 145 mmol/L   Potassium 5.1 3.5 - 5.1 mmol/L   Chloride 104 98 - 111 mmol/L   CO2 20 (L) 22 - 32 mmol/L   Glucose, Bld 190 (H) 70 - 99 mg/dL   BUN 13 6 - 20 mg/dL   Creatinine, Ser 1.03 (H) 0.44 - 1.00 mg/dL   Calcium 9.1 8.9 - 10.3 mg/dL   Total Protein 8.7 (H) 6.5 - 8.1 g/dL   Albumin 4.0 3.5 - 5.0 g/dL   AST 173 (H) 15 - 41 U/L   ALT 93 (H) 0 - 44 U/L   Alkaline Phosphatase 115 38 - 126 U/L   Total Bilirubin 0.9 0.3 - 1.2 mg/dL  GFR calc non Af Amer >60 >60 mL/min   GFR calc Af Amer >60 >60 mL/min   Anion gap 15 5 - 15    Comment: Performed at [facility], [address] [place], [place] [address]  CBC     Status: Abnormal   Collection Time: 08/16/19  5:18 AM  Result Value Ref Range   WBC 3.9 (L) 4.0 - 10.5 K/uL   RBC 3.77 (L) 3.87 - 5.11 MIL/uL   Hemoglobin 12.2 12.0 - 15.0 g/dL   HCT 38.0 36.0 - 46.0 %   MCV 100.8 (H) 80.0 - 100.0 fL   MCH 32.4 26.0 - 34.0 pg   MCHC 32.1 30.0 - 36.0 g/dL   RDW 12.2 11.5 - 15.5 %   Platelets 65 (L) 150 - 400 K/uL    Comment: Immature Platelet Fraction may be clinically indicated, consider ordering this additional test [id number]    nRBC 0.0 0.0 - 0.2 %    Comment: Performed at [facility], [address]  [place], [place] [address]  Protime-INR     Status: Abnormal   Collection Time: 08/16/19  5:18 AM  Result Value Ref Range   Prothrombin Time 16.0 (H) 11.4 - 15.2 seconds   INR 1.3 (H) 0.8 - 1.2    Comment: (NOTE) INR goal varies based on device and disease states. Performed at [facility], [address] [place], [place] [address]   Ethanol     Status: None   Collection Time: 08/16/19  5:18 AM  Result Value Ref Range   Alcohol, Ethyl (B) <10 <10 mg/dL    Comment: (NOTE) Lowest detectable limit for serum alcohol is 10 mg/dL. For medical purposes only. Performed at [facility], [address] [place], [place] [address]   Ammonia     Status: None   Collection Time: 08/16/19  6:00 AM  Result Value Ref Range   Ammonia 24 9 - 35 umol/L    Comment: Performed at [facility], [address] [place], [place] [address]  TSH     Status: None   Collection Time: 08/16/19  6:00 AM  Result Value Ref Range   TSH 0.775 0.350 - 4.500 uIU/mL    Comment: Performed by a 3rd Generation assay with a functional sensitivity of <=0.01 uIU/mL. Performed at [facility], [address] [place], [place] [address]   CK     Status: None   Collection Time: 08/16/19  6:00 AM  Result Value Ref Range   Total CK 196 38 - 234 U/L    Comment: Performed at [facility], [address] [place], [place] [address]   Ct Head Wo Contrast  Result Date: 08/16/2019 CLINICAL DATA:  Seizure, new, found on floor EXAM: CT HEAD WITHOUT CONTRAST CT CERVICAL SPINE WITHOUT CONTRAST TECHNIQUE: Multidetector CT imaging of the head and cervical spine was performed following the standard protocol without intravenous contrast. Multiplanar CT image reconstructions of the cervical spine were also generated. COMPARISON:  None. FINDINGS: CT HEAD FINDINGS Brain: No evidence of acute infarction, hemorrhage, hydrocephalus, extra-axial collection or mass lesion/mass effect. Vascular:  Atherosclerotic calcification Skull: Negative for fracture Sinuses/Orbits: Negative Other: Incidental multiple small calculi within the right parotid gland which is smaller and increased in density, presumably generalized scarring. CT CERVICAL SPINE FINDINGS Alignment: Negative for listhesis Skull base and vertebrae: No acute fracture Soft tissues and spinal canal: No prevertebral fluid or swelling. No visible canal hematoma. Disc levels:  Mid cervical  disc narrowing and ventral ridging. Upper chest: Negative IMPRESSION: No acute intracranial or cervical spine finding. No evidence of injury and no explanation for seizure. Electronically Signed   By: Monte Fantasia M.D.   On: 08/16/2019 05:48   Ct Cervical Spine Wo Contrast  Result Date: 08/16/2019 CLINICAL DATA:  Seizure, new, found on floor EXAM: CT HEAD WITHOUT CONTRAST CT CERVICAL SPINE WITHOUT CONTRAST TECHNIQUE: Multidetector CT imaging of the head and cervical spine was performed following the standard protocol without intravenous contrast. Multiplanar CT image reconstructions of the cervical spine were also generated. COMPARISON:  None. FINDINGS: CT HEAD FINDINGS Brain: No evidence of acute infarction, hemorrhage, hydrocephalus, extra-axial collection or mass lesion/mass effect. Vascular: Atherosclerotic calcification Skull: Negative for fracture Sinuses/Orbits: Negative Other: Incidental multiple small calculi within the right parotid gland which is smaller and increased in density, presumably generalized scarring. CT CERVICAL SPINE FINDINGS Alignment: Negative for listhesis Skull base and vertebrae: No acute fracture Soft tissues and spinal canal: No prevertebral fluid or swelling. No visible canal hematoma. Disc levels:  Mid cervical disc narrowing and ventral ridging. Upper chest: Negative IMPRESSION: No acute intracranial or cervical spine finding. No evidence of injury and no explanation for seizure. Electronically Signed   By: Monte Fantasia M.D.    On: 08/16/2019 05:48    Pending Labs Unresulted Labs (From admission, onward)    Start     Ordered   08/16/19 [id number]  HIV antibody (Routine Testing)  Add-on,   AD     08/16/19 [id number]   08/16/19 [id number]  SARS CORONAVIRUS 2 Nasal Swab Aptima Multi Swab  (Asymptomatic/Tier 2 Patients Labs)  Once,   STAT    Question Answer Comment  Is this test for diagnosis or screening Screening   Symptomatic for COVID-19 as defined by CDC No   Hospitalized for COVID-19 No   Admitted to ICU for COVID-19 No   Previously tested for COVID-19 No   Resident in a congregate (group) care setting No   Employed in healthcare setting No   Pregnant No      08/16/19 0822   08/16/19 0519  Urinalysis, Complete w Microscopic  ONCE - STAT,   STAT     08/16/19 0518   08/16/19 0519  Urine Drug Screen, Qualitative ([place] only)  Once,   STAT     08/16/19 0518   Signed and Held  Basic metabolic panel  Tomorrow morning,   R     Signed and Held   Signed and Held  CBC  Tomorrow morning,   R     Signed and Held   Signed and Held  CBC  (heparin)  Once,   R    Comments: Baseline for heparin therapy IF NOT ALREADY DRAWN.  Notify MD if PLT < 100 K.    Signed and Held   Signed and Held  Creatinine, serum  (heparin)  Once,   R    Comments: Baseline for heparin therapy IF NOT ALREADY DRAWN.    Signed and Held          Vitals/Pain Today's Vitals   08/16/19 0810 08/16/19 0830 08/16/19 0900 08/16/19 0930  BP: 126/87 (!) 149/87 111/66 118/73  Pulse: (!) 104 (!) 110 (!) 101 97  Resp: 17 (!) 30 19 19   Temp:      TempSrc:      SpO2: 100% 99% 100% 100%  Weight:      PainSc:        Isolation Precautions No active isolations  Medications Medications  LORazepam (ATIVAN) injection 0-4 mg (0 mg Intravenous Not Given 08/16/19 1000)    Or  LORazepam (ATIVAN) tablet 0-4 mg ( Oral See Alternative 08/16/19 1000)  LORazepam (ATIVAN) injection 0-4 mg (has no administration in time range)    Or  LORazepam (ATIVAN) tablet 0-4 mg  (has no administration in time range)  thiamine (VITAMIN B-1) tablet 100 mg (has no administration in time range)    Or  thiamine (B-1) injection 100 mg (has no administration in time range)  LORazepam (ATIVAN) injection 2 mg (has no administration in time range)  levETIRAcetam (KEPPRA) tablet 500 mg (has no administration in time range)  LORazepam (ATIVAN) injection 1 mg (1 mg Intravenous Given 08/16/19 0515)  sodium chloride 0.9 % bolus 1,000 mL (1,000 mLs Intravenous New Bag/Given 08/16/19 0530)  chlordiazePOXIDE (LIBRIUM) capsule 50 mg (50 mg Oral Given 08/16/19 [id number])    Mobility walks High fall risk   Focused Assessments see previous assessment   R Recommendations: See Admitting Provider Note  Report given to:   Additional Notes:

## 2019-08-16 NOTE — ED Provider Notes (Signed)
[facility] Emergency Department Provider Note  ____________________________________________  Time seen: Approximately 5:37 AM  I have reviewed the triage vital signs and the nursing notes.   HISTORY  Chief Complaint Seizures  Level 5 caveat:  Portions of the history and physical were unable to be obtained due to AMS  HPI Abigail Rice is a 53 y.o. female history of bipolar disorder, depression, diabetes, hypertension, hyperlipidemia who presents for evaluation of altered mental status.  Patient was found on the floor by her husband this evening with altered mental status.  When EMS arrived, patient was posturing, unresponsive, shaking, incontinent of urine and with tongue trauma.  She received 2 mg of Versed.  Fingerstick was normal at 212.  Patient is able to answer her name and follow simple commands but unable to provide any further history.   Past Medical History:  Diagnosis Date   Bipolar disorder ([place])    Depression    Diabetes mellitus without complication ([place])    Hyperlipidemia    Hypertension     Patient Active Problem List   Diagnosis Date Noted   Urinary tract infection without hematuria 05/28/2019   Vaginal burning 05/28/2019   Vitamin D deficiency 12/11/2018   Bipolar disorder ([place]) 08/07/2018   Essential hypertension 08/07/2018   Hyperlipidemia 08/07/2018   Diabetes ([place]) 08/07/2018   Depression 08/07/2018   Screening for breast cancer 08/07/2018   Uncontrolled type 2 diabetes mellitus with hyperglycemia ([place]) 08/07/2018   Irritable bowel syndrome with diarrhea 08/07/2018   Acquired hypothyroidism 08/07/2018   Restless legs 08/07/2018   Dysuria 08/07/2018   Elevated liver enzymes 06/13/2016    Past Surgical History:  Procedure Laterality Date   CESAREAN SECTION     COLONOSCOPY WITH PROPOFOL N/A 03/29/2016   Procedure: COLONOSCOPY WITH PROPOFOL;  Surgeon: Josefine Class, MD;  Location: [facility] ENDOSCOPY;   Service: Endoscopy;  Laterality: N/A;   TUBAL LIGATION      Prior to Admission medications   Medication Sig Start Date End Date Taking? Authorizing Provider  amLODipine (NORVASC) 5 MG tablet Take 1 tablet (5 mg total) by mouth daily. 12/11/18  Yes Boscia, Greer Ee, NP  hyoscyamine (LEVSIN) 0.125 MG tablet Take 1 tablet (0.125 mg total) by mouth every 6 (six) hours as needed for cramping. 05/28/19  Yes Boscia, Heather E, NP  insulin lispro (HUMALOG KWIKPEN) 100 UNIT/ML KiwkPen Inject into the skin. Use at lunch with sliding scale may use up to 10 units [place]   Yes [provider]  Insulin Lispro Prot & Lispro (HUMALOG MIX 75/25 KWIKPEN) (75-25) 100 UNIT/ML Kwikpen Inject 24 Units into the skin 2 (two) times daily. 07/21/19  Yes Ronnell Freshwater, NP  levothyroxine (SYNTHROID, LEVOTHROID) 50 MCG tablet Take 1 tablet (50 mcg total) by mouth daily before breakfast. 12/11/18  Yes Boscia, Heather E, NP  losartan (COZAAR) 50 MG tablet Take 1 tablet (50 mg total) by mouth daily. 12/11/18  Yes Ronnell Freshwater, NP  rOPINIRole (REQUIP) 0.25 MG tablet Take 1 to 2 tablets po QHS prn 12/11/18  Yes Boscia, Heather E, NP  Insulin Pen Needle (PEN NEEDLES) 32G X 4 MM MISC 1 each by Does not apply route daily. 03/19/19   Ronnell Freshwater, NP  ONETOUCH ULTRA test strip STRIPS FOR BLOOD SUGAR TESTING 1 TIME DAILY. DX 250.00 06/23/19   Ronnell Freshwater, NP    Allergies Ace inhibitors, Ciprofloxacin, Lisinopril, and Vytorin [ezetimibe-simvastatin]  Family History  Problem Relation Age of Onset  Diabetes Mother    Hypertension Mother     Social History Social History   Tobacco Use   Smoking status: Current Every Day Smoker    Types: Cigarettes   Smokeless tobacco: Never Used  Substance Use Topics   Alcohol use: Yes   Drug use: No    Review of Systems  Constitutional: Negative for fever. + AMS Respiratory: Negative for shortness of breath. Skin: Negative for rash. Neurological: +  seizure   ____________________________________________   PHYSICAL EXAM:  VITAL SIGNS: ED Triage Vitals  Enc Vitals Group     BP 08/16/19 0513 (!) 142/110     Pulse Rate 08/16/19 0511 (!) 123     Resp 08/16/19 0511 (!) 22     Temp 08/16/19 0513 (!) 97.5 F (36.4 C)     Temp Source 08/16/19 0513 Oral     SpO2 08/16/19 0511 100 %     Weight 08/16/19 0512 198 lb 6.6 oz (90 kg)     Height --      Head Circumference --      Peak Flow --      Pain Score --      Pain Loc --      Pain Edu? --      Excl. in [place]? --     Constitutional: Awake, shaking/ shivering, oriented to self only HEENT:      Head: Normocephalic and atraumatic.         Eyes: Conjunctivae are normal. Sclera is non-icteric.       Mouth/Throat: Mucous membranes are moist. Dry blood on the lips      Neck: Supple with no signs of meningismus. Cardiovascular: Tachycardic with regular rhythm. Respiratory: Normal respiratory effort. Lungs are clear to auscultation bilaterally. No wheezes, crackles, or rhonchi.  Gastrointestinal: Soft, non tender, and non distended with positive bowel sounds. No rebound or guarding. Musculoskeletal: Nontender with normal range of motion in all extremities. No edema, cyanosis, or erythema of extremities. Neurologic: confused, GCS 14, follows commands bilaterally, only answers her name, unable to answer any other questions. Skin: Skin is warm, dry and intact. No rash noted.  ____________________________________________   LABS (all labs ordered are listed, but only abnormal results are displayed)  Labs Reviewed  GLUCOSE, CAPILLARY - Abnormal; Notable for the following components:      Result Value   Glucose-Capillary 170 (*)    All other components within normal limits  COMPREHENSIVE METABOLIC PANEL - Abnormal; Notable for the following components:   CO2 20 (*)    Glucose, Bld 190 (*)    Creatinine, Ser 1.03 (*)    Total Protein 8.7 (*)    AST 173 (*)    ALT 93 (*)    All other  components within normal limits  CBC - Abnormal; Notable for the following components:   WBC 3.9 (*)    RBC 3.77 (*)    MCV 100.8 (*)    Platelets 65 (*)    All other components within normal limits  PROTIME-INR - Abnormal; Notable for the following components:   Prothrombin Time 16.0 (*)    INR 1.3 (*)    All other components within normal limits  AMMONIA  URINALYSIS, COMPLETE (UACMP) WITH MICROSCOPIC  URINE DRUG SCREEN, QUALITATIVE ([facility] ONLY)  ETHANOL  CBG MONITORING, ED   ____________________________________________  EKG  ED ECG REPORT I, Rudene Re, the attending physician, personally viewed and interpreted this ECG.  Sinus tachycardia, rate of 116, normal intervals, normal axis, no  ST elevations or depressions. ____________________________________________  RADIOLOGY  I have personally reviewed the images performed during this visit and I agree with the Radiologist's read.   Interpretation by Radiologist:  Ct Head Wo Contrast  Result Date: 08/16/2019 CLINICAL DATA:  Seizure, new, found on floor EXAM: CT HEAD WITHOUT CONTRAST CT CERVICAL SPINE WITHOUT CONTRAST TECHNIQUE: Multidetector CT imaging of the head and cervical spine was performed following the standard protocol without intravenous contrast. Multiplanar CT image reconstructions of the cervical spine were also generated. COMPARISON:  None. FINDINGS: CT HEAD FINDINGS Brain: No evidence of acute infarction, hemorrhage, hydrocephalus, extra-axial collection or mass lesion/mass effect. Vascular: Atherosclerotic calcification Skull: Negative for fracture Sinuses/Orbits: Negative Other: Incidental multiple small calculi within the right parotid gland which is smaller and increased in density, presumably generalized scarring. CT CERVICAL SPINE FINDINGS Alignment: Negative for listhesis Skull base and vertebrae: No acute fracture Soft tissues and spinal canal: No prevertebral fluid or swelling. No visible canal hematoma.  Disc levels:  Mid cervical disc narrowing and ventral ridging. Upper chest: Negative IMPRESSION: No acute intracranial or cervical spine finding. No evidence of injury and no explanation for seizure. Electronically Signed   By: Monte Fantasia M.D.   On: 08/16/2019 05:48   Ct Cervical Spine Wo Contrast  Result Date: 08/16/2019 CLINICAL DATA:  Seizure, new, found on floor EXAM: CT HEAD WITHOUT CONTRAST CT CERVICAL SPINE WITHOUT CONTRAST TECHNIQUE: Multidetector CT imaging of the head and cervical spine was performed following the standard protocol without intravenous contrast. Multiplanar CT image reconstructions of the cervical spine were also generated. COMPARISON:  None. FINDINGS: CT HEAD FINDINGS Brain: No evidence of acute infarction, hemorrhage, hydrocephalus, extra-axial collection or mass lesion/mass effect. Vascular: Atherosclerotic calcification Skull: Negative for fracture Sinuses/Orbits: Negative Other: Incidental multiple small calculi within the right parotid gland which is smaller and increased in density, presumably generalized scarring. CT CERVICAL SPINE FINDINGS Alignment: Negative for listhesis Skull base and vertebrae: No acute fracture Soft tissues and spinal canal: No prevertebral fluid or swelling. No visible canal hematoma. Disc levels:  Mid cervical disc narrowing and ventral ridging. Upper chest: Negative IMPRESSION: No acute intracranial or cervical spine finding. No evidence of injury and no explanation for seizure. Electronically Signed   By: Monte Fantasia M.D.   On: 08/16/2019 05:48      ____________________________________________   PROCEDURES  Procedure(s) performed: None Procedures Critical Care performed:  None ____________________________________________   INITIAL IMPRESSION / ASSESSMENT AND PLAN / ED COURSE  53 y.o. female history of bipolar disorder, depression, diabetes, hypertension, hyperlipidemia who presents for evaluation of altered mental status,  possible seizure after being found down this evening by her husband. No obvious signs of trauma on exam, patient is alert to self only, GCS14, able to tell me what her name was but does not answer any other questions, can follow simple commands bilaterally.  Patient is afebrile.  Differential diagnosis including traumatic head bleed versus subarachnoid hemorrhage versus new onset of seizure versus mass versus electrolyte abnormalities versus infection versus alcohol withdrawal.  Blood glucose is normal. Labs, CT head and cspine pending. UA and UDS pending. Seizure precautions initiated. Patient given 1mg  of IV ativan to help with generalized shaking.    _________________________ 6:02 AM on 08/16/2019 -----------------------------------------  Patient's husband is now at bedside.  He reports that he was sleeping in the living room when he heard a loud noise.  He went in the bathroom to check on patient and found her on the floor.  She was  shaking and not responsive.  He reports that she was at her baseline when she went to bed last night.  She has never had a seizure before.  She reports that she drinks occasionally.  She denies history of heavy drinking or drug use however review of epic shows that patient has a history of heavy drinking in the past.  Head CT with no acute intracranial findings.  Initial labs showing no significant electrolyte abnormalities, no signs of DKA or hypoglycemia.  Mildly elevated LFTs which is worse than prior from 2019, leukopenia with white count of 3.9 and thrombocytopenia with platelets of 65 (platelets were 143 on February 2019). Patient is more alert. Does not remember what happened, denies any pain or other complaints.  _________________________ 6:30 AM on 08/16/2019 -----------------------------------------  Patient is back to baseline.  Alert and oriented x3.  She tells me that she drinks 3-4 beers a day.  Last alcohol intake was yesterday evening.  Based on her  abnormal labs and the chronic alcohol abuse makes me concerned for alcohol withdrawal seizures.  Will admit patient to the hospitalist service.      As part of my medical decision making, I reviewed the following data within the [place] History obtained from family, Nursing notes reviewed and incorporated, Labs reviewed , EKG interpreted , Old EKG reviewed, Old chart reviewed, Radiograph reviewed , Discussed with admitting physician , Notes from prior ED visits and [place] Controlled Substance Database   Patient was evaluated in Emergency Department today for the symptoms described in the history of present illness. Patient was evaluated in the context of the global COVID-19 pandemic, which necessitated consideration that the patient might be at risk for infection with the SARS-CoV-2 virus that causes COVID-19. Institutional protocols and algorithms that pertain to the evaluation of patients at risk for COVID-19 are in a state of rapid change based on information released by regulatory bodies including the CDC and federal and state organizations. These policies and algorithms were followed during the patient's care in the ED.   ____________________________________________   FINAL CLINICAL IMPRESSION(S) / ED DIAGNOSES   Final diagnoses:  Seizure ([place])  Leukopenia, unspecified type  Thrombocytopenia ([place])  Transaminitis      NEW MEDICATIONS STARTED DURING THIS VISIT:  ED Discharge Orders    None       Note:  This document was prepared using Dragon voice recognition software and may include unintentional dictation errors.    Alfred Levins, [place], MD 08/16/19 [phone]

## 2019-08-16 NOTE — ED Notes (Signed)
Messaged admitting provider for covid swab order

## 2019-08-16 NOTE — ED Notes (Signed)
Phone taken to pt to screen for MRI

## 2019-08-16 NOTE — ED Triage Notes (Signed)
Pt found in floor by husband with ams. Per ems pt with decerebrate posturing and shaking on their arrival. Pt arrives with oral trauma, loss ob bladder. Pt with some response to instructions, md at bedside, ems with fsbs of 212, 18g in lac.

## 2019-08-16 NOTE — ED Notes (Signed)
Report to teresa and mitch, rn.

## 2019-08-17 DIAGNOSIS — R569 Unspecified convulsions: Secondary | ICD-10-CM

## 2019-08-17 LAB — BASIC METABOLIC PANEL
Anion gap: 9 (ref 5–15)
BUN: 12 mg/dL (ref 6–20)
CO2: 20 mmol/L — ABNORMAL LOW (ref 22–32)
Calcium: 8.5 mg/dL — ABNORMAL LOW (ref 8.9–10.3)
Chloride: 106 mmol/L (ref 98–111)
Creatinine, Ser: 1.01 mg/dL — ABNORMAL HIGH (ref 0.44–1.00)
GFR calc Af Amer: >60 mL/min (ref >60)
GFR calc non Af Amer: >60 mL/min (ref >60)
Glucose, Bld: 154 mg/dL — ABNORMAL HIGH (ref 70–99)
Potassium: 3.7 mmol/L (ref 3.5–5.1)
Sodium: 135 mmol/L (ref 135–145)

## 2019-08-17 LAB — CBC
HCT: 33.5 % — ABNORMAL LOW (ref 36.0–46.0)
Hemoglobin: 11 g/dL — ABNORMAL LOW (ref 12.0–15.0)
MCH: 32.4 pg (ref 26.0–34.0)
MCHC: 32.8 g/dL (ref 30.0–36.0)
MCV: 98.8 fL (ref 80.0–100.0)
Platelets: 54 10*3/uL — ABNORMAL LOW (ref 150–400)
RBC: 3.39 MIL/uL — ABNORMAL LOW (ref 3.87–5.11)
RDW: 12.1 % (ref 11.5–15.5)
WBC: 5.4 10*3/uL (ref 4.0–10.5)
nRBC: 0 % (ref 0.0–0.2)

## 2019-08-17 LAB — GLUCOSE, CAPILLARY
Glucose-Capillary: 163 mg/dL — ABNORMAL HIGH (ref 70–99)
Glucose-Capillary: 129 mg/dL — ABNORMAL HIGH (ref 70–99)
Glucose-Capillary: 148 mg/dL — ABNORMAL HIGH (ref 70–99)
Glucose-Capillary: 149 mg/dL — ABNORMAL HIGH (ref 70–99)

## 2019-08-17 MED ORDER — SODIUM CHLORIDE 0.9 % IV SOLN
INTRAVENOUS | Status: DC | PRN
  Administered 2019-08-17: 15 mL via INTRAVENOUS

## 2019-08-17 NOTE — Progress Notes (Signed)
Neurology Follow-up Progress Note [place] at [facility]  Date of Admission: 08/16/2019   ASSESSMENT:  Abigail Rice is a 53 y.o. year old female with past medical history significant for bipolar/depression and alcohol abuse who presents to the ED on 08/16/2019 after being found on the floor for unknown period of time. She was found with seizure-like activity. It's unclear how many seizures she had the night before until being found early morning of presentation. She had tongue biting and urinary incontinence. Exam right now remains non-focal. Patient was started on Keppra for maintenance. MRI Brain w/wo demonstrated no acute findings. EEG pending at this time    PLAN: - Continue with Keppra 500 mg BID - Follow up on EEG - Discussed with patient regarding seizure precautions again today  - If EEG is completed, patient can be discharged home with neurology follow up in 2-4 weeks. Patient states that she's willing to stay for another night if EEG is not completed today.     SUBJECTIVE:  No acute events overnight. Patient is feeling better today. Still feels a little sore but not as intense as yesterday.    OBJECTIVE: Vital Signs Temp:  [98.4 F (36.9 C)-99.5 F (37.5 C)] 99.5 F (37.5 C) (08/25 0922) Pulse Rate:  [86-108] 89 (08/25 0922) Resp:  [14-24] 17 (08/25 0922) BP: (110-133)/(74-86) 114/84 (08/25 0922) SpO2:  [100 %] 100 % (08/25 0922) Weight:  [74.7 kg] 74.7 kg (08/24 1249)   Physical Exam:  GENERAL:  No acute distress.   MENTAL STATUS EXAM:    Orientation: Alert and oriented to person, place and time.  Memory: Cooperative, follows commands well.   Language: Speech is clear and language is normal.   CRANIAL NERVES:    CN 2 (Optic): Visual fields intact to confrontation  CN 3,4,6 (EOM): Pupils equal and reactive to light. Full extraocular eye movement without nystagmus.  CN 5 (Trigeminal): Facial sensation is normal, no weakness of masticatory muscles.  CN 7  (Facial): No facial weakness or asymmetry.  CN 8 (Auditory): Auditory acuity grossly normal.  CN 9,10 (Glossophar): The uvula is midline, the palate elevates symmetrically.  CN 11 (spinal access): Normal sternocleidomastoid and trapezius strength.  CN 12 (Hypoglossal): The tongue is midline. No atrophy or fasciculations.Marland Kitchen   MOTOR:  Muscle Strength: Strength - 5/5 and symmetric in the upper and lower extremities, no pronation or drift. Muscle Tone: Tone and muscle bulk are normal in the upper and lower extremities.   SENSATION:   Intact to light touch   Labs: I've reviewed the labs for today   Diagnostic Results:  MRI brain w/wo: No acute intracranial abnormality. Mild chronic white matter changes in the deep white matter bilaterally likely due to chronic microvascular ischemia

## 2019-08-17 NOTE — Procedures (Addendum)
Patient Name: Abigail Rice  MRN: [id number]  Epilepsy Attending: Lora Havens  Referring Physician/Provider: Dr Laurene Footman Date:  08/17/2019 Duration: 27.41 mins  Patient history: 53 year old female with history of bipolar, depression, alcohol abuse who presented with seizure-like activity.  EEG to evaluate for seizure.  Level of alertness: Awake, asleep  AEDs during EEG study: Keppra, Ativan  Technical aspects: This EEG study was done with scalp electrodes positioned according to the 10-20 International system of electrode placement. Electrical activity was acquired at a sampling rate of 500Hz  and reviewed with a high frequency filter of 70Hz  and a low frequency filter of 1Hz . EEG data were recorded continuously and digitally stored.   Description: The posterior dominant rhythm consists of 9-10 Hz activity of moderate voltage (25-35 uV) seen predominantly in posterior head regions, symmetric and reactive to eye opening and eye closing.  Sleep was characterized by vertex waves, sleep spindles (12 to 14 Hz) maximal frontocentral. There is an excessive amount of 15 to 18 Hz, 2-3 uV beta activity with irregular morphology distributed symmetrically and diffusely.  Physiologic photic driving was seen during photic stimulation.  Hyperventilation was not performed.  IMPRESSION: This study is within normal limits. No seizures or epileptiform discharges were seen throughout the recording.  The excessive beta activity seen in the background is most likely due to the effect of benzodiazepine and is a benign EEG pattern.  [name] [name]

## 2019-08-17 NOTE — Discharge Instructions (Signed)
Do not drive, operate heavy machinery, perform activities at heights and participate in water activities until released by outpatient physician. ° °

## 2019-08-17 NOTE — Progress Notes (Signed)
eeg completed ° °

## 2019-08-18 LAB — HIV ANTIBODY (ROUTINE TESTING W REFLEX): HIV Screen 4th Generation wRfx: NONREACTIVE

## 2019-08-18 LAB — GLUCOSE, CAPILLARY
Glucose-Capillary: 159 mg/dL — ABNORMAL HIGH (ref 70–99)
Glucose-Capillary: 130 mg/dL — ABNORMAL HIGH (ref 70–99)

## 2019-08-18 MED ORDER — CEPHALEXIN 500 MG PO CAPS: 500.0000 mg | ORAL_CAPSULE | Freq: Three times a day (TID) | ORAL | 0 refills | Status: AC

## 2019-08-18 MED ORDER — LEVETIRACETAM 500 MG PO TABS: 500.0000 mg | ORAL_TABLET | Freq: Two times a day (BID) | ORAL | 0 refills | Status: DC

## 2019-08-18 NOTE — Progress Notes (Signed)
[place] [facility] [place] NAME: Abigail Rice    MR#:  [id number]  DATE OF BIRTH:  20-Nov-1966  SUBJECTIVE:  CHIEF COMPLAINT:   Chief Complaint  Patient presents with  . Seizures   No further seizures  REVIEW OF SYSTEMS:    Review of Systems  Constitutional: Negative for chills and fever.  HENT: Negative for sore throat.   Eyes: Negative for blurred vision, double vision and pain.  Respiratory: Negative for cough, hemoptysis, shortness of breath and wheezing.   Cardiovascular: Negative for chest pain, palpitations, orthopnea and leg swelling.  Gastrointestinal: Negative for abdominal pain, constipation, diarrhea, heartburn, nausea and vomiting.  Genitourinary: Negative for dysuria and hematuria.  Musculoskeletal: Negative for back pain and joint pain.  Skin: Negative for rash.  Neurological: Negative for sensory change, speech change, focal weakness and headaches.  Endo/Heme/Allergies: Does not bruise/bleed easily.  Psychiatric/Behavioral: Negative for depression. The patient is not nervous/anxious.     DRUG ALLERGIES:   Allergies  Allergen Reactions  . Ace Inhibitors   . Ciprofloxacin   . Lisinopril   . Vytorin [Ezetimibe-Simvastatin]     VITALS:  Blood pressure 114/83, pulse 80, temperature 99 F (37.2 C), temperature source Oral, resp. rate 16, height 5' (1.524 m), weight 74.7 kg, last menstrual period 11/07/2015, SpO2 99 %.  PHYSICAL EXAMINATION:   Physical Exam  GENERAL:  53 y.o.-year-old patient lying in the bed with no acute distress.  EYES: Pupils equal, round, reactive to light and accommodation. No scleral icterus. Extraocular muscles intact.  HEENT: Head atraumatic, normocephalic. Oropharynx and nasopharynx clear.  NECK:  Supple, no jugular venous distention. No thyroid enlargement, no tenderness.  LUNGS: Normal breath sounds bilaterally, no wheezing, rales, rhonchi. No use of accessory muscles of respiration.   CARDIOVASCULAR: S1, S2 normal. No murmurs, rubs, or gallops.  ABDOMEN: Soft, nontender, nondistended. Bowel sounds present. No organomegaly or mass.  EXTREMITIES: No cyanosis, clubbing or edema b/l.    NEUROLOGIC: Cranial nerves II through XII are intact. No focal Motor or sensory deficits b/l.   PSYCHIATRIC: The patient is alert and oriented x 3.  SKIN: No obvious rash, lesion, or ulcer.   LABORATORY PANEL:   CBC Recent Labs  Lab 08/17/19 0403  WBC 5.4  HGB 11.0*  HCT 33.5*  PLT 54*   ------------------------------------------------------------------------------------------------------------------ Chemistries  Recent Labs  Lab 08/16/19 0518 08/17/19 0403  NA 139 135  K 5.1 3.7  CL 104 106  CO2 20* 20*  GLUCOSE 190* 154*  BUN 13 12  CREATININE 1.03* 1.01*  CALCIUM 9.1 8.5*  AST 173*  --   ALT 93*  --   ALKPHOS 115  --   BILITOT 0.9  --    ------------------------------------------------------------------------------------------------------------------  Cardiac Enzymes No results for input(s): TROPONINI in the last 168 hours. ------------------------------------------------------------------------------------------------------------------  RADIOLOGY:  Mr Jeri Cos Wo Contrast  Result Date: 08/16/2019 CLINICAL DATA:  Seizure EXAM: MRI HEAD WITHOUT AND WITH CONTRAST TECHNIQUE: Multiplanar, multiecho pulse sequences of the brain and surrounding structures were obtained without and with intravenous contrast. CONTRAST:  7 mL Gadovist IV COMPARISON:  CT head 08/16/2019 FINDINGS: Brain: Ventricle size normal. Scattered small hyperintensities in the deep white matter both cerebral hemispheres. Negative for hemorrhage or mass. Normal enhancement postcontrast administration. Vascular: Normal arterial flow voids Skull and upper cervical spine: Negative Sinuses/Orbits: Negative Other: None IMPRESSION: No acute intracranial abnormality. Mild chronic white matter changes in the deep  white matter bilaterally likely due to chronic microvascular ischemia.  Electronically Signed   By: Franchot Gallo M.D.   On: 08/16/2019 14:30     ASSESSMENT AND PLAN:   *Seizure IV lorazepam as needed for seizures.  MRI brain - Nothing acute Neurology consult ed and appreciate input ON keppra 500 mg BID EEG pending Seizure precautions  *Hypertension Continue regular losartan.  *Diabetes sliding scale coverage  *Hypothyroidism Continue levothyroxine.  *Hyperkalemia Resolved  *Active smoking Consult to quit smoking on admission  All the records are reviewed and case discussed with Care Management/Social Worker Management plans discussed with the patient, family and they are in agreement.  CODE STATUS: FULL CODE  DVT Prophylaxis: SCDs  TOTAL TIME TAKING CARE OF THIS PATIENT: 35 minutes.   POSSIBLE D/C IN 1-2 DAYS, DEPENDING ON CLINICAL CONDITION.  Abigail Rice M.D on 08/18/2019 at 8:26 AM  Between 7am to 6pm - Pager - [phone]  After 6pm go to [email or web address] - password [email or web address] [facility]  Office  [phone]  CC: Primary care physician; Lavera Guise, MD  Note: This dictation was prepared with Dragon dictation along with smaller phrase technology. Any transcriptional errors that result from this process are unintentional.

## 2019-08-18 NOTE — Progress Notes (Signed)
Received Md order to discharge patient to home, reviewed home med, discharge instructions and follow up appointments with patient and patient verbalized understanding

## 2019-08-18 NOTE — Progress Notes (Signed)
Neurology Follow-up Progress Note [place] at [facility]  Date of Admission: 08/16/2019   ASSESSMENT:  Abigail Rice is a 53 y.o. year old female with past medical history significant for bipolar/depression and alcohol abuse who presents to the ED on 08/16/2019 after being found on the floor for unknown period of time. She was found with seizure-like activity. It's unclear how many seizures she had the night before until being found early morning of presentation. She had tongue biting and urinary incontinence. Exam right now remains non-focal. Patient was started on Keppra for maintenance. MRI Brain w/wo demonstrated no acute findings. EEG did not show any epileptiform discharges. Patient remains asymptomatic    PLAN: - Continue with Keppra 500 mg BID - Discussed with patient regarding seizure precautions again today  - Patient will need neurology follow up in 2-4 weeks    SUBJECTIVE:  No acute events overnight. No new symptoms. Patient is ready to go home.    OBJECTIVE: Vital Signs Temp:  [98.6 F (37 C)-99.4 F (37.4 C)] 99 F (37.2 C) (08/26 0751) Pulse Rate:  [80-89] 80 (08/26 0751) Resp:  [14-16] 16 (08/26 0751) BP: (114-141)/(83-90) 114/83 (08/26 0751) SpO2:  [99 %-100 %] 99 % (08/26 0751)   Physical Exam:  GENERAL:  No acute distress.   MENTAL STATUS EXAM:    Orientation: Alert and oriented to person, place and time.  Memory: Cooperative, follows commands well.   Language: Speech is clear and language is normal.   CRANIAL NERVES:    CN 2 (Optic): Visual fields intact to confrontation  CN 3,4,6 (EOM): Pupils equal and reactive to light. Full extraocular eye movement without nystagmus.  CN 5 (Trigeminal): Facial sensation is normal, no weakness of masticatory muscles.  CN 7 (Facial): No facial weakness or asymmetry.  CN 8 (Auditory): Auditory acuity grossly normal.  CN 9,10 (Glossophar): The uvula is midline, the palate elevates symmetrically.  CN 11 (spinal  access): Normal sternocleidomastoid and trapezius strength.  CN 12 (Hypoglossal): The tongue is midline. No atrophy or fasciculations.Marland Kitchen   MOTOR:  Muscle Strength: Strength - 5/5 and symmetric in the upper and lower extremities, no pronation or drift. Muscle Tone: Tone and muscle bulk are normal in the upper and lower extremities.   SENSATION:   Intact to light touch   Labs: I've reviewed the labs for today   Diagnostic Results:  MRI brain w/wo: No acute intracranial abnormality. Mild chronic white matter changes in the deep white matter bilaterally likely due to chronic microvascular ischemia  EEG: no epileptiform discharges

## 2019-08-19 LAB — URINE CULTURE: Culture: >=100000 — AB

## 2019-08-23 ENCOUNTER — Other Ambulatory Visit: Payer: Self-pay

## 2019-08-23 DIAGNOSIS — K580 Irritable bowel syndrome with diarrhea: Secondary | ICD-10-CM

## 2019-08-23 MED ORDER — HYOSCYAMINE SULFATE 0.125 MG PO TABS: 0.1250 mg | ORAL_TABLET | Freq: Four times a day (QID) | ORAL | 2 refills | Status: DC | PRN

## 2019-09-02 NOTE — Discharge Summary (Signed)
[place] [facility] [place] NAME: Abigail Rice    MR#:  [id number]  DATE OF BIRTH:  [birth year]  DATE OF ADMISSION:  08/16/2019 ADMITTING PHYSICIAN: Vaughan Basta, MD  DATE OF DISCHARGE: 08/18/2019  4:49 PM  PRIMARY CARE PHYSICIAN: Lavera Guise, MD   ADMISSION DIAGNOSIS:  Seizure ([place]) [R56.9] Thrombocytopenia ([place]) [D69.6] Transaminitis [R74.0] Leukopenia, unspecified type [D72.819]  DISCHARGE DIAGNOSIS:  Principal Problem:   Seizure ([place]) Active Problems:   Seizures ([place])   SECONDARY DIAGNOSIS:   Past Medical History:  Diagnosis Date  . Bipolar disorder ([place])   . Depression   . Diabetes mellitus without complication ([place])   . Hyperlipidemia   . Hypertension      ADMITTING HISTORY  HISTORY OF PRESENT ILLNESS: Abigail Rice  is a 53 y.o. female with a known history of bipolar disorder, depression, diabetes, hyperlipidemia, hypertension-came to emergency room as her husband called EMS due to seizures. Patient also had some tongue bite and loss of urinary control during the episode.  She does not remember when that happened and how she transported to emergency room. She denies any other complaints.  She denies any headache or focal weakness now. CT scan head was done and hospital service was called for admitting for seizures.  HOSPITAL COURSE:   *Seizure IV lorazepam as needed for seizures in the hospital.  MRI brain - Nothing acute Neurology consulted and appreciate input ON keppra 500 mg BID EEG no epileptiform waves Seizure precautions Patient had no seizures in the hospital.  Up and ambulating.  Feels well.  Seizure precautions explained.  *Hypertension Continue regular losartan.  *Diabetes sliding scale coverage  *Hypothyroidism Continue levothyroxine.  *Hyperkalemia Resolved  *Active smoking Consult to quit smoking on admission  Discharge home.  CONSULTS OBTAINED:    DRUG ALLERGIES:    Allergies  Allergen Reactions  . Ace Inhibitors   . Ciprofloxacin   . Lisinopril   . Vytorin [Ezetimibe-Simvastatin]     DISCHARGE MEDICATIONS:   Allergies as of 08/18/2019      Reactions   Ace Inhibitors    Ciprofloxacin    Lisinopril    Vytorin [ezetimibe-simvastatin]       Medication List    TAKE these medications   amLODipine 5 MG tablet Commonly known as: NORVASC Take 1 tablet (5 mg total) by mouth daily.   HumaLOG KwikPen 100 UNIT/ML KiwkPen Generic drug: insulin lispro Inject into the skin. Use at lunch with sliding scale may use up to 10 units [place]   Insulin Lispro Prot & Lispro (75-25) 100 UNIT/ML Kwikpen Commonly known as: HumaLOG Mix 75/25 KwikPen Inject 24 Units into the skin 2 (two) times daily.   levETIRAcetam 500 MG tablet Commonly known as: KEPPRA Take 1 tablet (500 mg total) by mouth 2 (two) times daily.   levothyroxine 50 MCG tablet Commonly known as: SYNTHROID Take 1 tablet (50 mcg total) by mouth daily before breakfast.   losartan 50 MG tablet Commonly known as: COZAAR Take 1 tablet (50 mg total) by mouth daily.   OneTouch Ultra test strip Generic drug: glucose blood STRIPS FOR BLOOD SUGAR TESTING 1 TIME DAILY. DX 250.00   Pen Needles 32G X 4 MM Misc 1 each by Does not apply route daily.   rOPINIRole 0.25 MG tablet Commonly known as: REQUIP Take 1 to 2 tablets po QHS prn     ASK your doctor about these medications   cephALEXin 500 MG capsule Commonly known as: KEFLEX Take  1 capsule (500 mg total) by mouth 3 (three) times daily for 3 days. Ask about: Should I take this medication?       Today   VITAL SIGNS:  Blood pressure 114/83, pulse 80, temperature 99 F (37.2 C), temperature source Oral, resp. rate 16, height 5' (1.524 m), weight 74.7 kg, last menstrual period 11/07/2015, SpO2 99 %.  I/O:  No intake or output data in the 24 hours ending 09/02/19 1519  PHYSICAL EXAMINATION:  Physical Exam  GENERAL:  53 y.o.-year-old  patient lying in the bed with no acute distress.  LUNGS: Normal breath sounds bilaterally, no wheezing, rales,rhonchi or crepitation. No use of accessory muscles of respiration.  CARDIOVASCULAR: S1, S2 normal. No murmurs, rubs, or gallops.  ABDOMEN: Soft, non-tender, non-distended. Bowel sounds present. No organomegaly or mass.  NEUROLOGIC: Moves all 4 extremities. PSYCHIATRIC: The patient is alert and oriented x 3.  SKIN: No obvious rash, lesion, or ulcer.   DATA REVIEW:   CBC No results for input(s): WBC, HGB, HCT, PLT in the last 168 hours.  Chemistries  No results for input(s): NA, K, CL, CO2, GLUCOSE, BUN, CREATININE, CALCIUM, MG, AST, ALT, ALKPHOS, BILITOT in the last 168 hours.  Invalid input(s): GFRCGP  Cardiac Enzymes No results for input(s): TROPONINI in the last 168 hours.  Microbiology Results  Results for orders placed or performed during the hospital encounter of 08/16/19  SARS CORONAVIRUS 2 Nasal Swab Aptima Multi Swab     Status: None   Collection Time: 08/16/19  8:37 AM   Specimen: Aptima Multi Swab; Nasal Swab  Result Value Ref Range Status   SARS Coronavirus 2 NEGATIVE NEGATIVE Final    Comment: (NOTE) SARS-CoV-2 target nucleic acids are NOT DETECTED. The SARS-CoV-2 RNA is generally detectable in upper and lower respiratory specimens during the acute phase of infection. Negative results do not preclude SARS-CoV-2 infection, do not rule out co-infections with other pathogens, and should not be used as the sole basis for treatment or other patient management decisions. Negative results must be combined with clinical observations, patient history, and epidemiological information. The expected result is Negative. Fact Sheet for Patients: SugarRoll.be Fact Sheet for Healthcare Providers: https://www.woods-mathews.com/ This test is not yet approved or cleared by the [place] FDA and  has been authorized for detection  and/or diagnosis of SARS-CoV-2 by FDA under an Emergency Use Authorization (EUA). This EUA will remain  in effect (meaning this test can be used) for the duration of the COVID-19 declaration under Section 56 4(b)(1) of the Act, 21 U.S.C. section 360bbb-3(b)(1), unless the authorization is terminated or revoked sooner. Performed at [facility], [place] [address] [place], [place] [address]   Urine Culture     Status: Abnormal   Collection Time: 08/16/19  1:17 PM   Specimen: Urine, Random  Result Value Ref Range Status   Specimen Description   Final    URINE, RANDOM Performed at [facility], [address], [place], [place] [address]    Special Requests   Final    NONE Performed at [facility], [address] [place], [place] [address]    Culture (A)  Final    >=100,000 COLONIES/mL ESCHERICHIA COLI Confirmed Extended Spectrum Beta-Lactamase Producer (ESBL).  In bloodstream infections from ESBL organisms, carbapenems are preferred over piperacillin/tazobactam. They are shown to have a lower risk of mortality.    Report Status 08/19/2019 FINAL  Final   Organism ID, Bacteria ESCHERICHIA COLI (A)  Final  Susceptibility   Escherichia coli - MIC*    AMPICILLIN >=32 RESISTANT Resistant     CEFAZOLIN >=64 RESISTANT Resistant     CEFTRIAXONE >=64 RESISTANT Resistant     CIPROFLOXACIN >=4 RESISTANT Resistant     GENTAMICIN >=16 RESISTANT Resistant     IMIPENEM <=0.25 SENSITIVE Sensitive     NITROFURANTOIN <=16 SENSITIVE Sensitive     TRIMETH/SULFA <=20 SENSITIVE Sensitive     AMPICILLIN/SULBACTAM 16 INTERMEDIATE Intermediate     PIP/TAZO <=4 SENSITIVE Sensitive     Extended ESBL POSITIVE Resistant     * >=100,000 COLONIES/mL ESCHERICHIA COLI    RADIOLOGY:  No results found.  Follow up with PCP in 1 week.  Management plans discussed with the patient, family and they are in agreement.  CODE STATUS:  Code Status History    Date Active Date Inactive  Code Status Order ID Comments User Context   08/16/2019 1129 08/18/2019 1949 Full Code [id number]  Vaughan Basta, MD Inpatient   08/16/2019 [id number] 08/16/2019 1129 Full Code [id number]  Rudene Re, MD ED   Advance Care Planning Activity      TOTAL TIME TAKING CARE OF THIS PATIENT ON DAY OF DISCHARGE: more than 30 minutes.   Abigail Rice M.D on 09/02/2019 at 3:19 PM  Between 7am to 6pm - Pager - [phone]  After 6pm go to [email or web address] - password [email or web address] [facility]  Office  [phone]  CC: Primary care physician; Lavera Guise, MD  Note: This dictation was prepared with Dragon dictation along with smaller phrase technology. Any transcriptional errors that result from this process are unintentional.

## 2019-09-07 ENCOUNTER — Encounter: Payer: Self-pay | Admitting: Internal Medicine

## 2019-09-16 ENCOUNTER — Other Ambulatory Visit: Payer: Self-pay

## 2019-09-16 MED ORDER — LEVETIRACETAM 500 MG PO TABS: 500.0000 mg | ORAL_TABLET | Freq: Two times a day (BID) | ORAL | 0 refills | Status: DC

## 2019-09-16 NOTE — Telephone Encounter (Signed)
As per Adam sent in one month fill of keppra until next appt with [place].

## 2019-09-29 ENCOUNTER — Other Ambulatory Visit: Payer: Self-pay

## 2019-09-30 ENCOUNTER — Ambulatory Visit (INDEPENDENT_AMBULATORY_CARE_PROVIDER_SITE_OTHER): Payer: Managed Care, Other (non HMO) | Admitting: Nurse Practitioner

## 2019-09-30 ENCOUNTER — Encounter: Payer: Self-pay | Admitting: Nurse Practitioner

## 2019-09-30 VITALS — BP 116/93 | HR 86 | Temp 97.7°F | Resp 16 | Ht 65.0 in | Wt 181.6 lb

## 2019-09-30 DIAGNOSIS — E039 Hypothyroidism, unspecified: Secondary | ICD-10-CM | POA: Diagnosis not present

## 2019-09-30 DIAGNOSIS — E1165 Type 2 diabetes mellitus with hyperglycemia: Secondary | ICD-10-CM | POA: Diagnosis not present

## 2019-09-30 DIAGNOSIS — Z794 Long term (current) use of insulin: Secondary | ICD-10-CM

## 2019-09-30 DIAGNOSIS — Z0001 Encounter for general adult medical examination with abnormal findings: Secondary | ICD-10-CM

## 2019-09-30 DIAGNOSIS — R300 Dysuria: Secondary | ICD-10-CM | POA: Diagnosis not present

## 2019-09-30 DIAGNOSIS — G2581 Restless legs syndrome: Secondary | ICD-10-CM

## 2019-09-30 DIAGNOSIS — R569 Unspecified convulsions: Secondary | ICD-10-CM | POA: Diagnosis not present

## 2019-09-30 DIAGNOSIS — R319 Hematuria, unspecified: Secondary | ICD-10-CM

## 2019-09-30 DIAGNOSIS — I10 Essential (primary) hypertension: Secondary | ICD-10-CM

## 2019-09-30 DIAGNOSIS — N390 Urinary tract infection, site not specified: Secondary | ICD-10-CM

## 2019-09-30 LAB — POCT URINALYSIS DIPSTICK
Bilirubin, UA: NEGATIVE
Glucose, UA: NEGATIVE
Ketones, UA: NEGATIVE
Nitrite, UA: NEGATIVE
Protein, UA: POSITIVE — AB
Spec Grav, UA: 1.01 (ref 1.010–1.025)
Urobilinogen, UA: 0.2 E.U./dL
pH, UA: 6 (ref 5.0–8.0)

## 2019-09-30 LAB — POCT GLYCOSYLATED HEMOGLOBIN (HGB A1C): Hemoglobin A1C: 5 % (ref 4.0–5.6)

## 2019-09-30 MED ORDER — NITROFURANTOIN MONOHYD MACRO 100 MG PO CAPS: 100.0000 mg | ORAL_CAPSULE | Freq: Two times a day (BID) | ORAL | 1 refills | Status: DC

## 2019-09-30 MED ORDER — LEVETIRACETAM 500 MG PO TABS: 500.0000 mg | ORAL_TABLET | Freq: Two times a day (BID) | ORAL | 1 refills | Status: DC

## 2019-09-30 MED ORDER — ROPINIROLE HCL 0.25 MG PO TABS: ORAL_TABLET | ORAL | 3 refills | Status: DC

## 2019-09-30 MED ORDER — INSULIN LISPRO PROT & LISPRO (75-25 MIX) 100 UNIT/ML KWIKPEN: 24.0000 [IU] | PEN_INJECTOR | Freq: Two times a day (BID) | SUBCUTANEOUS | 3 refills | Status: DC

## 2019-09-30 MED ORDER — LEVOTHYROXINE SODIUM 50 MCG PO TABS: 50.0000 ug | ORAL_TABLET | Freq: Every day | ORAL | 1 refills | Status: DC

## 2019-09-30 MED ORDER — LOSARTAN POTASSIUM 50 MG PO TABS: 50.0000 mg | ORAL_TABLET | Freq: Every day | ORAL | 3 refills | Status: DC

## 2019-09-30 NOTE — Progress Notes (Signed)
[facility] [place], [place] [address]  Internal MEDICINE  Office Visit Note  Patient Name: Abigail Rice  [id number]  [id number]  Date of Service: 09/30/2019   Pt is here for routine health maintenance examination  Chief Complaint  Patient presents with  . Annual Exam    no pap done today  . Diabetes  . Hypertension  . Hyperlipidemia  . Recurrent UTI    pt states that she has been having a recurrent uti for 2.5 months. when she was admitted into the hospital she was gven abx and iv that helped her symptoms,but sometimes when she urinates she shes blood in her urine, otherwise no symptoms  . Seizures    pt is now on medication and was put on aspirin daily     The patient Is here for health maintenance exam. She was recently hospitalized after having a seizure. She stayed for three days. MRI showed No acute intracranial abnormality. Mild chronic white matter changes in the deep white matter bilaterally likely due to chronic microvascular ischemia. No reason for the seizure was definitively given. She has had initial visit with neurology. They will be doing further testing to find root cause. She dies have strong family history of seizure disorder. While hospitalized, she did have significant UTI. Was discharged on Macrobid. She states this did help, but she does not feel like it was long enough. She has noted blood in the urine and has some dysuria. Through this, blood sugars have remained well controlled. Her HgbA1c is 5.0 today. Her blood pressure remains stable. She had screening mammogram 02/2019 and it was benign.     Current Medication: Outpatient Encounter Medications as of 09/30/2019  Medication Sig Note  . amLODipine (NORVASC) 5 MG tablet Take 1 tablet (5 mg total) by mouth daily.   Marland Kitchen aspirin EC 81 MG tablet Take 81 mg by mouth daily.   . hyoscyamine (LEVSIN) 0.125 MG tablet Take 1 tablet (0.125 mg total) by mouth every 6 (six) hours as needed for  cramping.   . insulin lispro (HUMALOG KWIKPEN) 100 UNIT/ML KiwkPen Inject into the skin. Use at lunch with sliding scale may use up to 10 units [place]   . Insulin Lispro Prot & Lispro (HUMALOG MIX 75/25 KWIKPEN) (75-25) 100 UNIT/ML Kwikpen Inject 24 Units into the skin 2 (two) times daily.   . Insulin Pen Needle (PEN NEEDLES) 32G X 4 MM MISC 1 each by Does not apply route daily.   Marland Kitchen levETIRAcetam (KEPPRA) 500 MG tablet Take 1 tablet (500 mg total) by mouth 2 (two) times daily.   Marland Kitchen levothyroxine (SYNTHROID) 50 MCG tablet Take 1 tablet (50 mcg total) by mouth daily before breakfast.   . losartan (COZAAR) 50 MG tablet Take 1 tablet (50 mg total) by mouth daily.   Glory Rosebush ULTRA test strip STRIPS FOR BLOOD SUGAR TESTING 1 TIME DAILY. DX 250.00   . rOPINIRole (REQUIP) 0.25 MG tablet Take 1 to 2 tablets po QHS prn   . [DISCONTINUED] Insulin Lispro Prot & Lispro (HUMALOG MIX 75/25 KWIKPEN) (75-25) 100 UNIT/ML Kwikpen Inject 24 Units into the skin 2 (two) times daily. 08/16/2019: Pt takes differently: Inject 24 units every morning.  . [DISCONTINUED] levETIRAcetam (KEPPRA) 500 MG tablet Take 1 tablet (500 mg total) by mouth 2 (two) times daily.   . [DISCONTINUED] levothyroxine (SYNTHROID, LEVOTHROID) 50 MCG tablet Take 1 tablet (50 mcg total) by mouth daily before breakfast.   . [DISCONTINUED] losartan (COZAAR) 50  MG tablet Take 1 tablet (50 mg total) by mouth daily.   . [DISCONTINUED] rOPINIRole (REQUIP) 0.25 MG tablet Take 1 to 2 tablets po QHS prn 08/16/2019: Pt takes differently: take 1-2 tablets every night.  . nitrofurantoin, macrocrystal-monohydrate, (MACROBID) 100 MG capsule Take 1 capsule (100 mg total) by mouth 2 (two) times daily.    No facility-administered encounter medications on file as of 09/30/2019.     Surgical History: Past Surgical History:  Procedure Laterality Date  . CESAREAN SECTION    . COLONOSCOPY WITH PROPOFOL N/A 03/29/2016   Procedure: COLONOSCOPY WITH PROPOFOL;  Surgeon: Josefine Class, MD;  Location: [facility] ENDOSCOPY;  Service: Endoscopy;  Laterality: N/A;  . TUBAL LIGATION      Medical History: Past Medical History:  Diagnosis Date  . Bipolar disorder ([place])   . Depression   . Diabetes mellitus without complication ([place])   . Hyperlipidemia   . Hypertension     Family History: Family History  Problem Relation Age of Onset  . Diabetes Mother   . Hypertension Mother       Review of Systems  Constitutional: Negative for activity change, appetite change, fatigue and unexpected weight change.  HENT: Negative for congestion, postnasal drip, sinus pain, sneezing and sore throat.   Respiratory: Negative for chest tightness, shortness of breath and wheezing.   Cardiovascular: Negative for chest pain and palpitations.  Gastrointestinal: Negative for constipation, diarrhea, nausea and vomiting.  Endocrine: Negative for cold intolerance, heat intolerance, polydipsia and polyuria.       Currently taking 24units humalog 75/25. Blood sugars running in the low 100s. Few lower than 100 and few higher than 200.   Genitourinary: Positive for dysuria, frequency and urgency.  Musculoskeletal: Negative for arthralgias, back pain and myalgias.  Skin: Negative for rash.  Allergic/Immunologic: Negative for environmental allergies, food allergies and immunocompromised state.  Neurological: Negative for dizziness, weakness and headaches.  Hematological: Negative for adenopathy.  Psychiatric/Behavioral: Negative for dysphoric mood. The patient is not nervous/anxious.      Today's Vitals   09/30/19 1232  BP: (!) 116/93  Pulse: 86  Resp: 16  Temp: 97.7 F (36.5 C)  SpO2: 99%  Weight: 181 lb 9.6 oz (82.4 kg)  Height: 5\' 5"  (1.651 m)   Body mass index is 30.22 kg/m.  Physical Exam Vitals signs and nursing note reviewed.  Constitutional:      General: She is not in acute distress.    Appearance: Normal appearance. She is well-developed. She is not diaphoretic.   HENT:     Head: Normocephalic and atraumatic.     Nose: Nose normal.     Mouth/Throat:     Pharynx: No oropharyngeal exudate.  Eyes:     General: Scleral icterus: US bladder.     Pupils: Pupils are equal, round, and reactive to light.  Neck:     Musculoskeletal: Normal range of motion and neck supple.     Thyroid: No thyromegaly.     Vascular: No carotid bruit or JVD.     Trachea: No tracheal deviation.  Cardiovascular:     Rate and Rhythm: Normal rate and regular rhythm.     Pulses: Normal pulses.          Dorsalis pedis pulses are 2+ on the right side and 2+ on the left side.       Posterior tibial pulses are 2+ on the right side and 2+ on the left side.     Heart sounds: Normal heart  sounds. No murmur. No friction rub. No gallop.   Pulmonary:     Effort: Pulmonary effort is normal. No respiratory distress.     Breath sounds: Normal breath sounds. No wheezing or rales.  Chest:     Chest wall: No tenderness.     Breasts:        Right: Normal. No swelling, bleeding, inverted nipple, mass, nipple discharge, skin change or tenderness.        Left: Normal. No swelling, bleeding, inverted nipple, mass, nipple discharge, skin change or tenderness.  Abdominal:     General: Bowel sounds are normal.     Palpations: Abdomen is soft.     Tenderness: There is no abdominal tenderness.  Genitourinary:    Comments: Urine sample positive fmoderate WBC and large blood. Positive for protein.  Musculoskeletal: Normal range of motion.     Right foot: Normal range of motion. No deformity.     Left foot: Normal range of motion. No deformity.  Feet:     Right foot:     Protective Sensation: 10 sites tested. 10 sites sensed.     Skin integrity: Skin integrity normal.     Toenail Condition: Right toenails are normal.     Left foot:     Protective Sensation: 10 sites tested. 10 sites sensed.     Skin integrity: Skin integrity normal.     Toenail Condition: Left toenails are normal.   Lymphadenopathy:     Cervical: No cervical adenopathy.  Skin:    General: Skin is warm and dry.  Neurological:     General: No focal deficit present.     Mental Status: She is alert and oriented to person, place, and time.     Cranial Nerves: No cranial nerve deficit.  Psychiatric:        Mood and Affect: Mood normal.        Behavior: Behavior normal.        Thought Content: Thought content normal.        Judgment: Judgment normal.      LABS: Recent Results (from the past 2160 hour(s))  Glucose, capillary     Status: Abnormal   Collection Time: 08/16/19  5:12 AM  Result Value Ref Range   Glucose-Capillary 170 (H) 70 - 99 mg/dL  Comprehensive metabolic panel     Status: Abnormal   Collection Time: 08/16/19  5:18 AM  Result Value Ref Range   Sodium 139 135 - 145 mmol/L   Potassium 5.1 3.5 - 5.1 mmol/L   Chloride 104 98 - 111 mmol/L   CO2 20 (L) 22 - 32 mmol/L   Glucose, Bld 190 (H) 70 - 99 mg/dL   BUN 13 6 - 20 mg/dL   Creatinine, Ser 1.03 (H) 0.44 - 1.00 mg/dL   Calcium 9.1 8.9 - 10.3 mg/dL   Total Protein 8.7 (H) 6.5 - 8.1 g/dL   Albumin 4.0 3.5 - 5.0 g/dL   AST 173 (H) 15 - 41 U/L   ALT 93 (H) 0 - 44 U/L   Alkaline Phosphatase 115 38 - 126 U/L   Total Bilirubin 0.9 0.3 - 1.2 mg/dL   GFR calc non Af Amer >60 >60 mL/min   GFR calc Af Amer >60 >60 mL/min   Anion gap 15 5 - 15    Comment: Performed at [facility], [address], [place], [place] [address]  CBC     Status: Abnormal   Collection Time: 08/16/19  5:18 AM  Result Value Ref Range   WBC 3.9 (L) 4.0 - 10.5 K/uL   RBC 3.77 (L) 3.87 - 5.11 MIL/uL   Hemoglobin 12.2 12.0 - 15.0 g/dL   HCT 38.0 36.0 - 46.0 %   MCV 100.8 (H) 80.0 - 100.0 fL   MCH 32.4 26.0 - 34.0 pg   MCHC 32.1 30.0 - 36.0 g/dL   RDW 12.2 11.5 - 15.5 %   Platelets 65 (L) 150 - 400 K/uL    Comment: Immature Platelet Fraction may be clinically indicated, consider ordering this additional test [id number]    nRBC 0.0 0.0 - 0.2 %     Comment: Performed at [facility], [address] [place], [place] [address]  Protime-INR     Status: Abnormal   Collection Time: 08/16/19  5:18 AM  Result Value Ref Range   Prothrombin Time 16.0 (H) 11.4 - 15.2 seconds   INR 1.3 (H) 0.8 - 1.2    Comment: (NOTE) INR goal varies based on device and disease states. Performed at [facility], [address] [place], [place] [address]   Ethanol     Status: None   Collection Time: 08/16/19  5:18 AM  Result Value Ref Range   Alcohol, Ethyl (B) <10 <10 mg/dL    Comment: (NOTE) Lowest detectable limit for serum alcohol is 10 mg/dL. For medical purposes only. Performed at [facility], [address] [place], [place] [address]   Ammonia     Status: None   Collection Time: 08/16/19  6:00 AM  Result Value Ref Range   Ammonia 24 9 - 35 umol/L    Comment: Performed at [facility], [address] [place], [place] [address]  TSH     Status: None   Collection Time: 08/16/19  6:00 AM  Result Value Ref Range   TSH 0.775 0.350 - 4.500 uIU/mL    Comment: Performed by a 3rd Generation assay with a functional sensitivity of <=0.01 uIU/mL. Performed at [facility], [address] [place], [place] [address]   CK     Status: None   Collection Time: 08/16/19  6:00 AM  Result Value Ref Range   Total CK 196 38 - 234 U/L    Comment: Performed at [facility], [place], [place] [address]  SARS CORONAVIRUS 2 Nasal Swab Aptima Multi Swab     Status: None   Collection Time: 08/16/19  8:37 AM   Specimen: Aptima Multi Swab; Nasal Swab  Result Value Ref Range   SARS Coronavirus 2 NEGATIVE NEGATIVE    Comment: (NOTE) SARS-CoV-2 target nucleic acids are NOT DETECTED. The SARS-CoV-2 RNA is generally detectable in upper and lower respiratory specimens during the acute phase of infection. Negative results do not preclude SARS-CoV-2 infection, do not rule out co-infections with  other pathogens, and should not be used as the sole basis for treatment or other patient management decisions. Negative results must be combined with clinical observations, patient history, and epidemiological information. The expected result is Negative. Fact Sheet for Patients: SugarRoll.be Fact Sheet for Healthcare Providers: https://www.woods-mathews.com/ This test is not yet approved or cleared by the [place] FDA and  has been authorized for detection and/or diagnosis of SARS-CoV-2 by FDA under an Emergency Use Authorization (EUA). This EUA will remain  in effect (meaning this test can be used) for the duration of the COVID-19 declaration under Section 56 4(b)(1) of the Act, 21 U.S.C. section 360bbb-3(b)(1), unless the authorization is  terminated or revoked sooner. Performed at [facility], [place] [address] [place], [place] [address]   Urinalysis, Complete w Microscopic     Status: Abnormal   Collection Time: 08/16/19  1:17 PM  Result Value Ref Range   Color, Urine YELLOW (A) YELLOW   APPearance CLOUDY (A) CLEAR   Specific Gravity, Urine 1.013 1.005 - 1.030   pH 5.0 5.0 - 8.0   Glucose, UA NEGATIVE NEGATIVE mg/dL   Hgb urine dipstick MODERATE (A) NEGATIVE   Bilirubin Urine NEGATIVE NEGATIVE   Ketones, ur 5 (A) NEGATIVE mg/dL   Protein, ur 100 (A) NEGATIVE mg/dL   Nitrite POSITIVE (A) NEGATIVE   Leukocytes,Ua LARGE (A) NEGATIVE   RBC / HPF 21-50 0 - 5 RBC/hpf   WBC, UA >50 (H) 0 - 5 WBC/hpf   Bacteria, UA MANY (A) NONE SEEN   Squamous Epithelial / LPF 0-5 0 - 5   WBC Clumps PRESENT    Mucus PRESENT    Non Squamous Epithelial PRESENT (A) NONE SEEN    Comment: Performed at [facility], [address] [place], [place] [address]  Urine Drug Screen, Qualitative ([facility] only)     Status: Abnormal   Collection Time: 08/16/19  1:17 PM  Result Value Ref Range   Tricyclic, Ur Screen NONE DETECTED NONE DETECTED    Amphetamines, Ur Screen NONE DETECTED NONE DETECTED   MDMA (Ecstasy)Ur Screen NONE DETECTED NONE DETECTED   Cocaine Metabolite,Ur [place] NONE DETECTED NONE DETECTED   Opiate, Ur Screen NONE DETECTED NONE DETECTED   Phencyclidine (PCP) Ur S NONE DETECTED NONE DETECTED   Cannabinoid 50 Ng, Ur [place] NONE DETECTED NONE DETECTED   Barbiturates, Ur Screen NONE DETECTED NONE DETECTED   Benzodiazepine, Ur Scrn POSITIVE (A) NONE DETECTED   Methadone Scn, Ur NONE DETECTED NONE DETECTED    Comment: (NOTE) Tricyclics + metabolites, urine    Cutoff 1000 ng/mL Amphetamines + metabolites, urine  Cutoff 1000 ng/mL MDMA (Ecstasy), urine              Cutoff 500 ng/mL Cocaine Metabolite, urine          Cutoff 300 ng/mL Opiate + metabolites, urine        Cutoff 300 ng/mL Phencyclidine (PCP), urine         Cutoff 25 ng/mL Cannabinoid, urine                 Cutoff 50 ng/mL Barbiturates + metabolites, urine  Cutoff 200 ng/mL Benzodiazepine, urine              Cutoff 200 ng/mL Methadone, urine                   Cutoff 300 ng/mL The urine drug screen provides only a preliminary, unconfirmed analytical test result and should not be used for non-medical purposes. Clinical consideration and professional judgment should be applied to any positive drug screen result due to possible interfering substances. A more specific alternate chemical method must be used in order to obtain a confirmed analytical result. Gas chromatography / mass spectrometry (GC/MS) is the preferred confirmat ory method. Performed at [facility], [address] [place], [place] [address]   Urine Culture     Status: Abnormal   Collection Time: 08/16/19  1:17 PM   Specimen: Urine, Random  Result Value Ref Range   Specimen Description      URINE, RANDOM Performed at [facility], [address], [place], [place] [address]  Special Requests      NONE Performed at [facility], [place],  [place] [address]    Culture (A)     >=100,000 COLONIES/mL ESCHERICHIA COLI Confirmed Extended Spectrum Beta-Lactamase Producer (ESBL).  In bloodstream infections from ESBL organisms, carbapenems are preferred over piperacillin/tazobactam. They are shown to have a lower risk of mortality.    Report Status 08/19/2019 FINAL    Organism ID, Bacteria ESCHERICHIA COLI (A)       Susceptibility   Escherichia coli - MIC*    AMPICILLIN >=32 RESISTANT Resistant     CEFAZOLIN >=64 RESISTANT Resistant     CEFTRIAXONE >=64 RESISTANT Resistant     CIPROFLOXACIN >=4 RESISTANT Resistant     GENTAMICIN >=16 RESISTANT Resistant     IMIPENEM <=0.25 SENSITIVE Sensitive     NITROFURANTOIN <=16 SENSITIVE Sensitive     TRIMETH/SULFA <=20 SENSITIVE Sensitive     AMPICILLIN/SULBACTAM 16 INTERMEDIATE Intermediate     PIP/TAZO <=4 SENSITIVE Sensitive     Extended ESBL POSITIVE Resistant     * >=100,000 COLONIES/mL ESCHERICHIA COLI  Glucose, capillary     Status: Abnormal   Collection Time: 08/16/19  4:57 PM  Result Value Ref Range   Glucose-Capillary 189 (H) 70 - 99 mg/dL  Glucose, capillary     Status: Abnormal   Collection Time: 08/16/19  9:00 PM  Result Value Ref Range   Glucose-Capillary 184 (H) 70 - 99 mg/dL  HIV antibody (Routine Testing)     Status: None   Collection Time: 08/17/19  4:03 AM  Result Value Ref Range   HIV Screen 4th Generation wRfx Non Reactive Non Reactive    Comment: (NOTE) Performed At: [facility] [place], [place] [id number] Rush Farmer MD [id number]   Basic metabolic panel     Status: Abnormal   Collection Time: 08/17/19  4:03 AM  Result Value Ref Range   Sodium 135 135 - 145 mmol/L   Potassium 3.7 3.5 - 5.1 mmol/L   Chloride 106 98 - 111 mmol/L   CO2 20 (L) 22 - 32 mmol/L   Glucose, Bld 154 (H) 70 - 99 mg/dL   BUN 12 6 - 20 mg/dL   Creatinine, Ser 1.01 (H) 0.44 - 1.00 mg/dL   Calcium 8.5 (L) 8.9 - 10.3 mg/dL   GFR calc non Af Amer >60 >60 mL/min    GFR calc Af Amer >60 >60 mL/min   Anion gap 9 5 - 15    Comment: Performed at [facility], [address] [place], [place] [address]  CBC     Status: Abnormal   Collection Time: 08/17/19  4:03 AM  Result Value Ref Range   WBC 5.4 4.0 - 10.5 K/uL   RBC 3.39 (L) 3.87 - 5.11 MIL/uL   Hemoglobin 11.0 (L) 12.0 - 15.0 g/dL   HCT 33.5 (L) 36.0 - 46.0 %   MCV 98.8 80.0 - 100.0 fL   MCH 32.4 26.0 - 34.0 pg   MCHC 32.8 30.0 - 36.0 g/dL   RDW 12.1 11.5 - 15.5 %   Platelets 54 (L) 150 - 400 K/uL    Comment: Immature Platelet Fraction may be clinically indicated, consider ordering this additional test [id number] CONSISTENT WITH PREVIOUS RESULT    nRBC 0.0 0.0 - 0.2 %    Comment: Performed at [facility], [address] [place], [place] [address]  Glucose, capillary     Status: Abnormal   Collection Time: 08/17/19  7:43 AM  Result Value Ref Range   Glucose-Capillary 163 (H) 70 - 99 mg/dL  Glucose, capillary     Status: Abnormal   Collection Time: 08/17/19 11:37 AM  Result Value Ref Range   Glucose-Capillary 129 (H) 70 - 99 mg/dL  Glucose, capillary     Status: Abnormal   Collection Time: 08/17/19  4:47 PM  Result Value Ref Range   Glucose-Capillary 148 (H) 70 - 99 mg/dL  Glucose, capillary     Status: Abnormal   Collection Time: 08/17/19  8:57 PM  Result Value Ref Range   Glucose-Capillary 149 (H) 70 - 99 mg/dL  Glucose, capillary     Status: Abnormal   Collection Time: 08/18/19  7:33 AM  Result Value Ref Range   Glucose-Capillary 159 (H) 70 - 99 mg/dL  Glucose, capillary     Status: Abnormal   Collection Time: 08/18/19 11:42 AM  Result Value Ref Range   Glucose-Capillary 130 (H) 70 - 99 mg/dL  POCT HgB A1C     Status: None   Collection Time: 09/30/19 12:36 PM  Result Value Ref Range   Hemoglobin A1C 5.0 4.0 - 5.6 %   HbA1c POC (<> result, manual entry)     HbA1c, POC (prediabetic range)     HbA1c, POC (controlled diabetic range)    POCT Urinalysis  Dipstick     Status: Abnormal   Collection Time: 09/30/19 12:38 PM  Result Value Ref Range   Color, UA     Clarity, UA     Glucose, UA Negative Negative   Bilirubin, UA negative    Ketones, UA negative    Spec Grav, UA 1.010 1.010 - 1.025   Blood, UA large    pH, UA 6.0 5.0 - 8.0   Protein, UA Positive (A) Negative    Comment: 100+   Urobilinogen, UA 0.2 0.2 or 1.0 E.U./dL   Nitrite, UA negative    Leukocytes, UA Moderate (2+) (A) Negative   Appearance     Odor     Assessment/Plan: 1. Encounter for general adult medical examination with abnormal findings Annual helath maintenance exam today.   2. Type 2 diabetes mellitus with hyperglycemia, with long-term current use of insulin (HCC) - POCT HgB A1C 5.0. very good control. Continue all diabetic medication as prescribed.   3. Seizure (HCC) Renew Keppra 500mg  twice daily. Regular visits with neurology as scheduled.  - levETIRAcetam (KEPPRA) 500 MG tablet; Take 1 tablet (500 mg total) by mouth 2 (two) times daily.  Dispense: 180 tablet; Refill: 1  4. Acquired hypothyroidism Stable.  - levothyroxine (SYNTHROID) 50 MCG tablet; Take 1 tablet (50 mcg total) by mouth daily before breakfast.  Dispense: 90 tablet; Refill: 1  5. Urinary tract infection with hematuria, site unspecified macrobid 100mg  bid for 14 days. Will get ultrasound of kidneys and bladder for further evaluation.   6. Essential hypertension Stable - losartan (COZAAR) 50 MG tablet; Take 1 tablet (50 mg total) by mouth daily.  Dispense: 90 tablet; Refill: 3  7. Restless legs Stable. - rOPINIRole (REQUIP) 0.25 MG tablet; Take 1 to 2 tablets po QHS prn  Dispense: 120 tablet; Refill: 3  8. Dysuria - POCT Urinalysis Dipstick - CULTURE, URINE COMPREHENSIVE  9. Uncontrolled type 2 diabetes mellitus with hyperglycemia (HCC) - Insulin Lispro Prot & Lispro (HUMALOG MIX 75/25 KWIKPEN) (75-25) 100 UNIT/ML Kwikpen; Inject 24 Units into the skin 2 (two) times daily.   Dispense: 15 mL; Refill: 3  General Counseling: Rielyn verbalizes  understanding of the findings of todays visit and agrees with plan of treatment. I have discussed any further diagnostic evaluation that may be needed or ordered today. We also reviewed her medications today. she has been encouraged to call the office with any questions or concerns that should arise related to todays visit.    Counseling:  Diabetes Counseling:  1. Addition of ACE inh/ ARB'S for nephroprotection. Microalbumin is updated  2. Diabetic foot care, prevention of complications. Podiatry consult 3. Exercise and lose weight.  4. Diabetic eye examination, Diabetic eye exam is updated  5. Monitor blood sugar closlely. nutrition counseling.  6. Sign and symptoms of hypoglycemia including shaking sweating,confusion and headaches.  This patient was seen by Leretha Pol FNP Collaboration with Dr Lavera Guise as a part of collaborative care agreement  Orders Placed This Encounter  Procedures  . CULTURE, URINE COMPREHENSIVE  . POCT Urinalysis Dipstick  . POCT HgB A1C    Meds ordered this encounter  Medications  . nitrofurantoin, macrocrystal-monohydrate, (MACROBID) 100 MG capsule    Sig: Take 1 capsule (100 mg total) by mouth 2 (two) times daily.    Dispense:  28 capsule    Refill:  1    Order Specific Question:   Supervising Provider    Answer:   Lavera Guise [id number]  . Insulin Lispro Prot & Lispro (HUMALOG MIX 75/25 KWIKPEN) (75-25) 100 UNIT/ML Kwikpen    Sig: Inject 24 Units into the skin 2 (two) times daily.    Dispense:  15 mL    Refill:  3    Order Specific Question:   Supervising Provider    Answer:   Lavera Guise [id number]  . levETIRAcetam (KEPPRA) 500 MG tablet    Sig: Take 1 tablet (500 mg total) by mouth 2 (two) times daily.    Dispense:  180 tablet    Refill:  1    Order Specific Question:   Supervising Provider    Answer:   Lavera Guise [id number]  . levothyroxine (SYNTHROID) 50 MCG tablet    Sig:  Take 1 tablet (50 mcg total) by mouth daily before breakfast.    Dispense:  90 tablet    Refill:  1    Order Specific Question:   Supervising Provider    Answer:   Lavera Guise [place]  . losartan (COZAAR) 50 MG tablet    Sig: Take 1 tablet (50 mg total) by mouth daily.    Dispense:  90 tablet    Refill:  3    Order Specific Question:   Supervising Provider    Answer:   Lavera Guise [id number]  . rOPINIRole (REQUIP) 0.25 MG tablet    Sig: Take 1 to 2 tablets po QHS prn    Dispense:  120 tablet    Refill:  3    Please fill as 90 day prescription.    Order Specific Question:   Supervising Provider    Answer:   Lavera Guise [id number]    Time spent: [place], MD  Internal Medicine

## 2019-09-30 NOTE — Progress Notes (Signed)
She continues to have blood and WBC in urine. Restarted antibiotics. Ordered renal and bladder ultrasound. When I see her back will get echo and carotids ordered.

## 2019-10-04 LAB — CULTURE, URINE COMPREHENSIVE

## 2019-10-04 NOTE — Progress Notes (Signed)
Patient started on macrobid during visit

## 2019-10-08 ENCOUNTER — Ambulatory Visit: Payer: Managed Care, Other (non HMO)

## 2019-10-08 ENCOUNTER — Other Ambulatory Visit: Payer: Self-pay

## 2019-10-08 DIAGNOSIS — R319 Hematuria, unspecified: Secondary | ICD-10-CM

## 2019-10-08 DIAGNOSIS — N390 Urinary tract infection, site not specified: Secondary | ICD-10-CM | POA: Diagnosis not present

## 2019-10-11 NOTE — Progress Notes (Signed)
Uterine fibroid present. No abnormalities with the kidneys. Discuss at follow up.

## 2019-10-14 ENCOUNTER — Ambulatory Visit: Payer: Managed Care, Other (non HMO) | Admitting: Nurse Practitioner

## 2019-10-14 ENCOUNTER — Encounter: Payer: Self-pay | Admitting: Nurse Practitioner

## 2019-10-14 ENCOUNTER — Other Ambulatory Visit: Payer: Self-pay

## 2019-10-14 VITALS — BP 126/78 | HR 86 | Temp 97.5°F | Resp 16 | Ht 65.0 in | Wt 182.0 lb

## 2019-10-14 DIAGNOSIS — D259 Leiomyoma of uterus, unspecified: Secondary | ICD-10-CM | POA: Diagnosis not present

## 2019-10-14 DIAGNOSIS — R569 Unspecified convulsions: Secondary | ICD-10-CM | POA: Diagnosis not present

## 2019-10-14 DIAGNOSIS — Z794 Long term (current) use of insulin: Secondary | ICD-10-CM

## 2019-10-14 DIAGNOSIS — E1165 Type 2 diabetes mellitus with hyperglycemia: Secondary | ICD-10-CM

## 2019-10-14 DIAGNOSIS — I10 Essential (primary) hypertension: Secondary | ICD-10-CM | POA: Diagnosis not present

## 2019-10-14 MED ORDER — LEVETIRACETAM 500 MG PO TABS: 500.0000 mg | ORAL_TABLET | Freq: Two times a day (BID) | ORAL | 1 refills | Status: DC

## 2019-10-14 NOTE — Progress Notes (Signed)
[facility] [place],  [address]  Internal MEDICINE  Office Visit Note  Patient Name: Abigail Rice  [id number]  [id number]  Date of Service: 10/31/2019  Chief Complaint  Patient presents with  . Follow-up    ultrasound results    The patient is here for follow up of kidneys and bladder. Renal functions had been elevated and she had persistent UTI. On ultrasound, both kidneys and the bladder were unremarkable. There is note of a uterine fibroid present. She has had intermittent episodes of post-menopausal bleeding. Most recent episode was this past Tuesday.  She was hospitalized for seizure she suffered 09/21/2019. She was given a referral to a neurologist who is not in her insurance network. She needs to have referral for provider who is in her network. She will need to have refill of her seizure medication until she is able to get in with her new neurologist. Treated with macrobid for UTI, diagnosed at her most recent visit. Her symptoms have resolved. She has two days left to complete antibiotics.       Current Medication: Outpatient Encounter Medications as of 10/14/2019  Medication Sig  . amLODipine (NORVASC) 5 MG tablet Take 1 tablet (5 mg total) by mouth daily.  Marland Kitchen aspirin EC 81 MG tablet Take 81 mg by mouth daily.  . hyoscyamine (LEVSIN) 0.125 MG tablet Take 1 tablet (0.125 mg total) by mouth every 6 (six) hours as needed for cramping.  . insulin lispro (HUMALOG KWIKPEN) 100 UNIT/ML KiwkPen Inject into the skin. Use at lunch with sliding scale may use up to 10 units [place]  . Insulin Lispro Prot & Lispro (HUMALOG MIX 75/25 KWIKPEN) (75-25) 100 UNIT/ML Kwikpen Inject 24 Units into the skin 2 (two) times daily.  . Insulin Pen Needle (PEN NEEDLES) 32G X 4 MM MISC 1 each by Does not apply route daily.  Marland Kitchen levETIRAcetam (KEPPRA) 500 MG tablet Take 1 tablet (500 mg total) by mouth 2 (two) times daily.  Marland Kitchen levothyroxine (SYNTHROID) 50 MCG tablet Take 1 tablet  (50 mcg total) by mouth daily before breakfast.  . losartan (COZAAR) 50 MG tablet Take 1 tablet (50 mg total) by mouth daily.  . nitrofurantoin, macrocrystal-monohydrate, (MACROBID) 100 MG capsule Take 1 capsule (100 mg total) by mouth 2 (two) times daily.  Glory Rosebush ULTRA test strip STRIPS FOR BLOOD SUGAR TESTING 1 TIME DAILY. DX 250.00  . rOPINIRole (REQUIP) 0.25 MG tablet Take 1 to 2 tablets po QHS prn  . [DISCONTINUED] levETIRAcetam (KEPPRA) 500 MG tablet Take 1 tablet (500 mg total) by mouth 2 (two) times daily.   No facility-administered encounter medications on file as of 10/14/2019.     Surgical History: Past Surgical History:  Procedure Laterality Date  . CESAREAN SECTION    . COLONOSCOPY WITH PROPOFOL N/A 03/29/2016   Procedure: COLONOSCOPY WITH PROPOFOL;  Surgeon: Josefine Class, MD;  Location: [facility] ENDOSCOPY;  Service: Endoscopy;  Laterality: N/A;  . TUBAL LIGATION      Medical History: Past Medical History:  Diagnosis Date  . Bipolar disorder ([place])   . Depression   . Diabetes mellitus without complication ([place])   . Hyperlipidemia   . Hypertension     Family History: Family History  Problem Relation Age of Onset  . Diabetes Mother   . Hypertension Mother     Social History   Socioeconomic History  . Marital status: Married    Spouse name: Not on file  . Number of children:  Not on file  . Years of education: Not on file  . Highest education level: Not on file  Occupational History  . Not on file  Social Needs  . Financial resource strain: Not on file  . Food insecurity    Worry: Not on file    Inability: Not on file  . Transportation needs    Medical: Not on file    Non-medical: Not on file  Tobacco Use  . Smoking status: Current Every Day Smoker    Types: Cigarettes  . Smokeless tobacco: Never Used  Substance and Sexual Activity  . Alcohol use: Yes  . Drug use: No  . Sexual activity: Not on file  Lifestyle  . Physical activity    Days  per week: Not on file    Minutes per session: Not on file  . Stress: Not on file  Relationships  . Social Herbalist on phone: Not on file    Gets together: Not on file    Attends religious service: Not on file    Active member of club or organization: Not on file    Attends meetings of clubs or organizations: Not on file    Relationship status: Not on file  . Intimate partner violence    Fear of current or ex partner: Not on file    Emotionally abused: Not on file    Physically abused: Not on file    Forced sexual activity: Not on file  Other Topics Concern  . Not on file  Social History Narrative  . Not on file      Review of Systems  Constitutional: Negative for activity change, appetite change, fatigue and unexpected weight change.  HENT: Negative for congestion, postnasal drip, sinus pain, sneezing and sore throat.   Respiratory: Negative for chest tightness, shortness of breath and wheezing.   Cardiovascular: Negative for chest pain and palpitations.  Gastrointestinal: Negative for constipation, diarrhea, nausea and vomiting.  Endocrine: Negative for cold intolerance, heat intolerance, polydipsia and polyuria.       Currently taking 24units humalog 75/25. Blood sugars running in the low 100s. Few lower than 100 and few higher than 200.   Genitourinary: Positive for vaginal bleeding. Negative for dysuria, frequency and urgency.  Musculoskeletal: Negative for arthralgias, back pain and myalgias.  Skin: Negative for rash.  Allergic/Immunologic: Negative for environmental allergies, food allergies and immunocompromised state.  Neurological: Positive for seizures and headaches. Negative for dizziness and weakness.       Patient suffered a seizure on 09/21/2019. She needs to have a referral to neurologist within her network.   Hematological: Negative for adenopathy.  Psychiatric/Behavioral: Negative for dysphoric mood. The patient is not nervous/anxious.     Today's  Vitals   10/14/19 0928  BP: 126/78  Pulse: 86  Resp: 16  Temp: (!) 97.5 F (36.4 C)  SpO2: 99%  Weight: 182 lb (82.6 kg)  Height: 5\' 5"  (1.651 m)   Body mass index is 30.29 kg/m.  Physical Exam Vitals signs and nursing note reviewed.  Constitutional:      General: She is not in acute distress.    Appearance: Normal appearance. She is well-developed. She is not diaphoretic.  HENT:     Head: Normocephalic and atraumatic.     Nose: Nose normal.     Mouth/Throat:     Pharynx: No oropharyngeal exudate.  Eyes:     Extraocular Movements: Extraocular movements intact.     Pupils: Pupils are equal,  round, and reactive to light.  Neck:     Musculoskeletal: Normal range of motion and neck supple.     Thyroid: No thyromegaly.     Vascular: No JVD.     Trachea: No tracheal deviation.  Cardiovascular:     Rate and Rhythm: Normal rate and regular rhythm.     Heart sounds: Normal heart sounds. No murmur. No friction rub. No gallop.   Pulmonary:     Effort: Pulmonary effort is normal. No respiratory distress.     Breath sounds: Normal breath sounds. No wheezing or rales.  Chest:     Chest wall: No tenderness.  Abdominal:     General: Bowel sounds are normal.     Palpations: Abdomen is soft.     Tenderness: There is no abdominal tenderness.  Musculoskeletal: Normal range of motion.  Lymphadenopathy:     Cervical: No cervical adenopathy.  Skin:    General: Skin is warm and dry.  Neurological:     Mental Status: She is alert and oriented to person, place, and time.     Cranial Nerves: No cranial nerve deficit.  Psychiatric:        Behavior: Behavior normal.        Thought Content: Thought content normal.        Judgment: Judgment normal.   Assessment/Plan: 1. Uterine leiomyoma, unspecified location Recent ultrasound of kidneys and bladder were unremarkable, but did indicate presence of uterine fibroid. Has had few episodes of post menopausal bleeding. Will get transvaginal  ultrasound for further evaluation. Refer to GYN as indicated.  - US Pelvic Complete With Transvaginal; Future  2. Seizures ([place]) Refer to neurologist within her insurance network.  - Ambulatory referral to Neurology  3. Seizure ([place]) Continue keppra as prescribed in ER. Refer to neurology for further evaluation and treatment.  - levETIRAcetam (KEPPRA) 500 MG tablet; Take 1 tablet (500 mg total) by mouth 2 (two) times daily.  Dispense: 180 tablet; Refill: 1  4. Type 2 diabetes mellitus with hyperglycemia, with long-term current use of insulin (HCC) Continue diabetic medication as prescribed   5. Essential hypertension Continue bp medication as prescribed   General Counseling: Sheyna verbalizes understanding of the findings of todays visit and agrees with plan of treatment. I have discussed any further diagnostic evaluation that may be needed or ordered today. We also reviewed her medications today. she has been encouraged to call the office with any questions or concerns that should arise related to todays visit.  This patient was seen by Leretha Pol FNP Collaboration with Dr Lavera Guise as a part of collaborative care agreement  Orders Placed This Encounter  Procedures  . US Pelvic Complete With Transvaginal  . Ambulatory referral to Neurology    Meds ordered this encounter  Medications  . levETIRAcetam (KEPPRA) 500 MG tablet    Sig: Take 1 tablet (500 mg total) by mouth 2 (two) times daily.    Dispense:  180 tablet    Refill:  1    Order Specific Question:   Supervising Provider    Answer:   Lavera Guise [id number]    Time spent: 64 Minutes      Dr Lavera Guise Internal medicine

## 2019-10-18 ENCOUNTER — Telehealth: Payer: Self-pay

## 2019-10-18 NOTE — Telephone Encounter (Signed)
Tried calling pt and unable to leave a message regarding her ultrasound and follow up appointments. Beth

## 2019-10-27 ENCOUNTER — Ambulatory Visit
Admission: RE | Admit: 2019-10-27 | Discharge: 2019-10-27 | Disposition: A | Discharge status: Home / Self Care | Payer: Managed Care, Other (non HMO) | Source: Ambulatory Visit | Attending: Nurse Practitioner | Admitting: Nurse Practitioner

## 2019-10-27 ENCOUNTER — Other Ambulatory Visit: Payer: Self-pay

## 2019-10-27 DIAGNOSIS — D259 Leiomyoma of uterus, unspecified: Secondary | ICD-10-CM | POA: Insufficient documentation

## 2019-10-27 NOTE — Progress Notes (Signed)
Refer to GYN for further evaluation. Follow up 11/12/2019

## 2019-10-31 DIAGNOSIS — D259 Leiomyoma of uterus, unspecified: Secondary | ICD-10-CM | POA: Insufficient documentation

## 2019-11-10 ENCOUNTER — Telehealth: Payer: Self-pay

## 2019-11-10 NOTE — Telephone Encounter (Signed)
CONFIRMED AND SCREENED FOR COVID FOR 11-12-19 OV.

## 2019-11-12 ENCOUNTER — Encounter: Payer: Self-pay | Admitting: Nurse Practitioner

## 2019-11-12 ENCOUNTER — Other Ambulatory Visit: Payer: Self-pay

## 2019-11-12 ENCOUNTER — Ambulatory Visit: Payer: Managed Care, Other (non HMO) | Admitting: Nurse Practitioner

## 2019-11-12 VITALS — BP 138/81 | HR 99 | Temp 97.9°F | Resp 16 | Ht 65.0 in | Wt 185.0 lb

## 2019-11-12 DIAGNOSIS — D259 Leiomyoma of uterus, unspecified: Secondary | ICD-10-CM

## 2019-11-12 NOTE — Progress Notes (Signed)
[facility] [place], [place] [address]  Internal MEDICINE  Office Visit Note  Patient Name: Abigail Rice  [id number]  [id number]  Date of Service: 11/21/2019  Chief Complaint  Patient presents with  . Follow-up    ultrasound review     The patient is here for follow up of ultrasound. She had been havig episodes of post menopausal bleeding. 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 Also showed slightly thickened endometrium. She should have a referral to GYN for further evaluation and treatment.      Current Medication: Outpatient Encounter Medications as of 11/12/2019  Medication Sig  . amLODipine (NORVASC) 5 MG tablet Take 1 tablet (5 mg total) by mouth daily.  Marland Kitchen aspirin EC 81 MG tablet Take 81 mg by mouth daily.  . hyoscyamine (LEVSIN) 0.125 MG tablet Take 1 tablet (0.125 mg total) by mouth every 6 (six) hours as needed for cramping.  . insulin lispro (HUMALOG KWIKPEN) 100 UNIT/ML KiwkPen Inject into the skin. Use at lunch with sliding scale may use up to 10 units [place]  . Insulin Lispro Prot & Lispro (HUMALOG MIX 75/25 KWIKPEN) (75-25) 100 UNIT/ML Kwikpen Inject 24 Units into the skin 2 (two) times daily.  . Insulin Pen Needle (PEN NEEDLES) 32G X 4 MM MISC 1 each by Does not apply route daily.  Marland Kitchen levETIRAcetam (KEPPRA) 500 MG tablet Take 1 tablet (500 mg total) by mouth 2 (two) times daily.  Marland Kitchen levothyroxine (SYNTHROID) 50 MCG tablet Take 1 tablet (50 mcg total) by mouth daily before breakfast.  . losartan (COZAAR) 50 MG tablet Take 1 tablet (50 mg total) by mouth daily.  . nitrofurantoin, macrocrystal-monohydrate, (MACROBID) 100 MG capsule Take 1 capsule (100 mg total) by mouth 2 (two) times daily.  Glory Rosebush ULTRA test strip STRIPS FOR BLOOD SUGAR TESTING 1 TIME DAILY. DX 250.00  . rOPINIRole (REQUIP) 0.25 MG tablet Take 1 to 2 tablets po QHS prn   No facility-administered encounter medications on  file as of 11/12/2019.     Surgical History: Past Surgical History:  Procedure Laterality Date  . CESAREAN SECTION    . COLONOSCOPY WITH PROPOFOL N/A 03/29/2016   Procedure: COLONOSCOPY WITH PROPOFOL;  Surgeon: Josefine Class, MD;  Location: [facility] ENDOSCOPY;  Service: Endoscopy;  Laterality: N/A;  . TUBAL LIGATION      Medical History: Past Medical History:  Diagnosis Date  . Bipolar disorder ([place])   . Depression   . Diabetes mellitus without complication ([place])   . Hyperlipidemia   . Hypertension     Family History: Family History  Problem Relation Age of Onset  . Diabetes Mother   . Hypertension Mother     Social History   Socioeconomic History  . Marital status: Married    Spouse name: Not on file  . Number of children: Not on file  . Years of education: Not on file  . Highest education level: Not on file  Occupational History  . Not on file  Social Needs  . Financial resource strain: Not on file  . Food insecurity    Worry: Not on file    Inability: Not on file  . Transportation needs    Medical: Not on file    Non-medical: Not on file  Tobacco Use  . Smoking status: Current Every Day Smoker    Types: Cigarettes  . Smokeless tobacco: Never Used  Substance and Sexual Activity  . Alcohol use:  Yes  . Drug use: No  . Sexual activity: Not on file  Lifestyle  . Physical activity    Days per week: Not on file    Minutes per session: Not on file  . Stress: Not on file  Relationships  . Social Herbalist on phone: Not on file    Gets together: Not on file    Attends religious service: Not on file    Active member of club or organization: Not on file    Attends meetings of clubs or organizations: Not on file    Relationship status: Not on file  . Intimate partner violence    Fear of current or ex partner: Not on file    Emotionally abused: Not on file    Physically abused: Not on file    Forced sexual activity: Not on file  Other Topics  Concern  . Not on file  Social History Narrative  . Not on file      Review of Systems  Constitutional: Negative for activity change, chills, fatigue and unexpected weight change.  HENT: Negative for congestion, postnasal drip, rhinorrhea, sneezing and sore throat.   Respiratory: Negative for cough, chest tightness and shortness of breath.   Cardiovascular: Negative for chest pain and palpitations.  Gastrointestinal: Negative for abdominal pain, constipation, diarrhea, nausea and vomiting.  Endocrine: Negative for cold intolerance, heat intolerance, polydipsia and polyuria.       Blood sugars doing well   Genitourinary: Positive for vaginal bleeding. Negative for dysuria and frequency.  Musculoskeletal: Negative for arthralgias, back pain, joint swelling and neck pain.  Skin: Negative for rash.  Allergic/Immunologic: Negative for environmental allergies.  Neurological: Negative for dizziness, tremors, numbness and headaches.  Hematological: Negative for adenopathy. Does not bruise/bleed easily.  Psychiatric/Behavioral: Negative for behavioral problems (Depression), sleep disturbance and suicidal ideas. The patient is not nervous/anxious.    Today's Vitals   11/12/19 0937  BP: 138/81  Pulse: 99  Resp: 16  Temp: 97.9 F (36.6 C)  SpO2: 99%  Weight: 185 lb (83.9 kg)  Height: 5\' 5"  (1.651 m)   Body mass index is 30.79 kg/m.  Physical Exam Vitals signs and nursing note reviewed.  Constitutional:      General: She is not in acute distress.    Appearance: Normal appearance. She is well-developed. She is not diaphoretic.  HENT:     Head: Normocephalic and atraumatic.     Mouth/Throat:     Pharynx: No oropharyngeal exudate.  Eyes:     Pupils: Pupils are equal, round, and reactive to light.  Neck:     Musculoskeletal: Normal range of motion and neck supple.     Thyroid: No thyromegaly.     Vascular: No JVD.     Trachea: No tracheal deviation.  Cardiovascular:     Rate and  Rhythm: Normal rate and regular rhythm.     Heart sounds: Normal heart sounds. No murmur. No friction rub. No gallop.   Pulmonary:     Effort: Pulmonary effort is normal. No respiratory distress.     Breath sounds: Normal breath sounds. No wheezing or rales.  Chest:     Chest wall: No tenderness.  Abdominal:     Palpations: Abdomen is soft.  Musculoskeletal: Normal range of motion.  Lymphadenopathy:     Cervical: No cervical adenopathy.  Skin:    General: Skin is warm and dry.  Neurological:     Mental Status: She is alert and oriented to  person, place, and time.     Cranial Nerves: No cranial nerve deficit.  Psychiatric:        Mood and Affect: Mood normal.        Behavior: Behavior normal.        Thought Content: Thought content normal.        Judgment: Judgment normal.    Assessment/Plan: 1. 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 Also showed slightly thickened endometrium. Referral to GYN for further evaluation and treatment. - Ambulatory referral to Gynecology  General Counseling: Aleksia verbalizes understanding of the findings of todays visit and agrees with plan of treatment. I have discussed any further diagnostic evaluation that may be needed or ordered today. We also reviewed her medications today. she has been encouraged to call the office with any questions or concerns that should arise related to todays visit.    Orders Placed This Encounter  Procedures  . Ambulatory referral to Gynecology    This patient was seen by Leretha Pol FNP Collaboration with Dr Lavera Guise as a part of collaborative care agreement  Time spent: 25 Minutes      Dr Lavera Guise Internal medicine

## 2019-12-02 ENCOUNTER — Encounter: Payer: Self-pay | Admitting: Obstetrics and Gynecology

## 2019-12-02 ENCOUNTER — Other Ambulatory Visit: Payer: Self-pay

## 2019-12-02 ENCOUNTER — Ambulatory Visit (INDEPENDENT_AMBULATORY_CARE_PROVIDER_SITE_OTHER): Payer: Managed Care, Other (non HMO) | Admitting: Obstetrics and Gynecology

## 2019-12-02 VITALS — BP 115/73 | HR 105 | Ht 65.0 in | Wt 185.8 lb

## 2019-12-02 DIAGNOSIS — D259 Leiomyoma of uterus, unspecified: Secondary | ICD-10-CM

## 2019-12-02 NOTE — Progress Notes (Signed)
Patient comes in today for referral from PCP. US showed lesion on uterus.

## 2019-12-02 NOTE — Progress Notes (Signed)
HPI:      [name] is a 53 y.o. No obstetric history on file. who LMP was Patient's last menstrual period was 11/07/2015 (approximate).  Subjective:   She presents today for a follow-up of some abnormalities noted secondarily at ultrasound.  The patient has been having issues with her bladder and had a UTI which was difficult to treat and lasted for more than a month.  During her work-up for this she underwent 2 pelvic ultrasounds. Slight endometrial thickening and uterine lipoma leiomyoma noted. Patient has had no pelvic pain issues, cramping, or postmenopausal bleeding.    Hx: The following portions of the patient's history were reviewed and updated as appropriate:             She  has a past medical history of Bipolar disorder ([place]), Depression, Diabetes mellitus without complication ([place]), Hyperlipidemia, and Hypertension. She does not have any pertinent problems on file. She  has a past surgical history that includes Cesarean section; Tubal ligation; and Colonoscopy with propofol (N/A, 03/29/2016). Her family history includes Diabetes in her mother; Hypertension in her mother. She  reports that she has been smoking cigarettes. She has never used smokeless tobacco. She reports current alcohol use. She reports that she does not use drugs. She has a current medication list which includes the following prescription(s): amlodipine, aspirin ec, hyoscyamine, insulin lispro prot & lispro, pen needles, levetiracetam, levothyroxine, losartan, onetouch ultra, ropinirole, insulin lispro, and nitrofurantoin (macrocrystal-monohydrate). She is allergic to ace inhibitors; ciprofloxacin; lisinopril; and vytorin [ezetimibe-simvastatin].       Review of Systems:  Review of Systems  Constitutional: Denied constitutional symptoms, night sweats, recent illness, fatigue, fever, insomnia and weight loss.  Eyes: Denied eye symptoms, eye pain, photophobia, vision change and visual disturbance.   Ears/Nose/Throat/Neck: Denied ear, nose, throat or neck symptoms, hearing loss, nasal discharge, sinus congestion and sore throat.  Cardiovascular: Denied cardiovascular symptoms, arrhythmia, chest pain/pressure, edema, exercise intolerance, orthopnea and palpitations.  Respiratory: Denied pulmonary symptoms, asthma, pleuritic pain, productive sputum, cough, dyspnea and wheezing.  Gastrointestinal: Denied, gastro-esophageal reflux, melena, nausea and vomiting.  Genitourinary: See HPI for additional information.  Musculoskeletal: Denied musculoskeletal symptoms, stiffness, swelling, muscle weakness and myalgia.  Dermatologic: Denied dermatology symptoms, rash and scar.  Neurologic: Denied neurology symptoms, dizziness, headache, neck pain and syncope.  Psychiatric: Denied psychiatric symptoms, anxiety and depression.  Endocrine: Denied endocrine symptoms including hot flashes and night sweats.   Meds:   Current Outpatient Medications on File Prior to Visit  Medication Sig Dispense Refill  . amLODipine (NORVASC) 5 MG tablet Take 1 tablet (5 mg total) by mouth daily. 90 tablet 3  . aspirin EC 81 MG tablet Take 81 mg by mouth daily.    . hyoscyamine (LEVSIN) 0.125 MG tablet Take 1 tablet (0.125 mg total) by mouth every 6 (six) hours as needed for cramping. 120 tablet 2  . Insulin Lispro Prot & Lispro (HUMALOG MIX 75/25 KWIKPEN) (75-25) 100 UNIT/ML Kwikpen Inject 24 Units into the skin 2 (two) times daily. 15 mL 3  . Insulin Pen Needle (PEN NEEDLES) 32G X 4 MM MISC 1 each by Does not apply route daily. 100 each 1  . levETIRAcetam (KEPPRA) 500 MG tablet Take 1 tablet (500 mg total) by mouth 2 (two) times daily. 180 tablet 1  . levothyroxine (SYNTHROID) 50 MCG tablet Take 1 tablet (50 mcg total) by mouth daily before breakfast. 90 tablet 1  . losartan (COZAAR) 50 MG tablet Take 1 tablet (50 mg  total) by mouth daily. 90 tablet 3  . ONETOUCH ULTRA test strip STRIPS FOR BLOOD SUGAR TESTING 1 TIME  DAILY. DX 250.00 150 strip 1  . rOPINIRole (REQUIP) 0.25 MG tablet Take 1 to 2 tablets po QHS prn 120 tablet 3  . insulin lispro (HUMALOG KWIKPEN) 100 UNIT/ML KiwkPen Inject into the skin. Use at lunch with sliding scale may use up to 10 units [place]    . nitrofurantoin, macrocrystal-monohydrate, (MACROBID) 100 MG capsule Take 1 capsule (100 mg total) by mouth 2 (two) times daily. (Patient not taking: Reported on 12/02/2019) 28 capsule 1   No current facility-administered medications on file prior to visit.    Objective:     Vitals:   12/02/19 0850  BP: 115/73  Pulse: (!) 105              Ultrasound results reviewed directly with the patient  Assessment:    No obstetric history on file. Patient Active Problem List   Diagnosis Date Noted  . Uterine leiomyoma 10/31/2019  . Encounter for general adult medical examination with abnormal findings 09/30/2019  . Seizure ([place]) 08/16/2019  . Seizures ([place]) 08/16/2019  . Urinary tract infection without hematuria 05/28/2019  . Vaginal burning 05/28/2019  . Vitamin D deficiency 12/11/2018  . Bipolar disorder ([place]) 08/07/2018  . Essential hypertension 08/07/2018  . Hyperlipidemia 08/07/2018  . Type 2 diabetes mellitus with hyperglycemia, with long-term current use of insulin ([place]) 08/07/2018  . Depression 08/07/2018  . Screening for breast cancer 08/07/2018  . Uncontrolled type 2 diabetes mellitus with hyperglycemia ([place]) 08/07/2018  . Irritable bowel syndrome with diarrhea 08/07/2018  . Acquired hypothyroidism 08/07/2018  . Restless legs 08/07/2018  . Dysuria 08/07/2018  . Elevated liver enzymes 06/13/2016     1. Uterine leiomyoma, unspecified location     Based on ultrasound findings of lipoma leiomyoma no further work-up is necessary.  This is a benign condition and patient is having no symptoms from this.  2.  Patient has had no postmenopausal bleeding so although the endometrium is slightly thickened for menopause I do not believe  a further work-up is necessary.  She remains at very low risk for endometrial hyperplasia or malignancy.  4 mm thickness is simply used to rule out endometrial cancer in a postmenopausal woman who is bleeding.  In an asymptomatic woman 6 mm is very likely insignificant.   Plan:            1.  We have discussed the above issues found on ultrasound in detail.  Patient will contact us if she has any symptoms that we have discussed including bleeding cramping or other pelvic issues.  I have reassured her regarding the above secondary findings I believe no further work-up is necessary. Orders No orders of the defined types were placed in this encounter.   No orders of the defined types were placed in this encounter.     F/U  Return for Pt to contact us if symptoms worsen. I spent 32 minutes involved in the care of this patient of which greater than 50% was spent discussing signs and symptoms and findings at ultrasound as noted above.  All questions answered.  Finis Bud, M.D. 12/02/2019 9:42 AM

## 2019-12-14 ENCOUNTER — Other Ambulatory Visit: Payer: Self-pay

## 2019-12-14 DIAGNOSIS — K580 Irritable bowel syndrome with diarrhea: Secondary | ICD-10-CM

## 2019-12-14 MED ORDER — HYOSCYAMINE SULFATE 0.125 MG PO TABS: 0.1250 mg | ORAL_TABLET | Freq: Four times a day (QID) | ORAL | 2 refills | Status: DC | PRN

## 2019-12-29 ENCOUNTER — Other Ambulatory Visit: Payer: Self-pay | Admitting: Nurse Practitioner

## 2019-12-29 ENCOUNTER — Other Ambulatory Visit: Payer: Self-pay

## 2019-12-29 DIAGNOSIS — E1165 Type 2 diabetes mellitus with hyperglycemia: Secondary | ICD-10-CM

## 2019-12-29 MED ORDER — PEN NEEDLES 32G X 4 MM MISC: 1.0000 | Freq: Every day | 1 refills | Status: DC

## 2019-12-29 MED ORDER — HUMALOG MIX 75/25 (75-25) 100 UNIT/ML ~~LOC~~ SUSP: 24.0000 [IU] | Freq: Two times a day (BID) | SUBCUTANEOUS | 3 refills | Status: DC

## 2019-12-29 NOTE — Progress Notes (Signed)
Renewed humalog 75/82mix - 24 units twice daily and sent to her pharmacy

## 2020-01-06 ENCOUNTER — Telehealth: Payer: Self-pay

## 2020-01-06 ENCOUNTER — Other Ambulatory Visit: Payer: Self-pay

## 2020-01-06 DIAGNOSIS — E1165 Type 2 diabetes mellitus with hyperglycemia: Secondary | ICD-10-CM

## 2020-01-06 MED ORDER — INSULIN LISPRO PROT & LISPRO (75-25 MIX) 100 UNIT/ML KWIKPEN: 24.0000 [IU] | PEN_INJECTOR | Freq: Two times a day (BID) | SUBCUTANEOUS | 3 refills | Status: DC

## 2020-01-06 NOTE — Telephone Encounter (Signed)
TRIED TO CALL THE PATIENT NUMEROUS TIMES...PT NEVER PICKEDUP.TAT

## 2020-01-27 ENCOUNTER — Other Ambulatory Visit: Payer: Self-pay

## 2020-01-27 DIAGNOSIS — I10 Essential (primary) hypertension: Secondary | ICD-10-CM

## 2020-01-27 MED ORDER — AMLODIPINE BESYLATE 5 MG PO TABS: 5.0000 mg | ORAL_TABLET | Freq: Every day | ORAL | 1 refills | Status: DC

## 2020-02-23 ENCOUNTER — Telehealth: Payer: Self-pay

## 2020-02-23 NOTE — Telephone Encounter (Signed)
CONFIRMED AND SCREENED FOR 02-25-20 OV.

## 2020-02-25 ENCOUNTER — Other Ambulatory Visit: Payer: Self-pay

## 2020-02-25 ENCOUNTER — Ambulatory Visit (INDEPENDENT_AMBULATORY_CARE_PROVIDER_SITE_OTHER): Payer: Managed Care, Other (non HMO) | Admitting: Nurse Practitioner

## 2020-02-25 ENCOUNTER — Encounter: Payer: Self-pay | Admitting: Nurse Practitioner

## 2020-02-25 ENCOUNTER — Telehealth: Payer: Self-pay

## 2020-02-25 VITALS — BP 137/87 | HR 95 | Temp 97.5°F | Resp 16 | Ht 65.0 in | Wt 185.8 lb

## 2020-02-25 DIAGNOSIS — I10 Essential (primary) hypertension: Secondary | ICD-10-CM

## 2020-02-25 DIAGNOSIS — E039 Hypothyroidism, unspecified: Secondary | ICD-10-CM | POA: Diagnosis not present

## 2020-02-25 DIAGNOSIS — E1165 Type 2 diabetes mellitus with hyperglycemia: Secondary | ICD-10-CM | POA: Diagnosis not present

## 2020-02-25 LAB — POCT GLYCOSYLATED HEMOGLOBIN (HGB A1C): Hemoglobin A1C: 5 % (ref 4.0–5.6)

## 2020-02-25 NOTE — Telephone Encounter (Signed)
When checking out patient refused referral to ophthalmology will call when wants referral made. klh

## 2020-02-25 NOTE — Progress Notes (Signed)
[facility] [place], [place] [address]  Internal MEDICINE  Office Visit Note  Patient Name: Abigail Rice  [id number]  [id number]  Date of Service: 02/25/2020  Chief Complaint  Patient presents with  . Follow-up  . Depression  . Diabetes  . Hyperlipidemia  . Hypertension    The patient is here for routine visit. Blood sugars are well managed. HgbA1c 5.0 today. Blood pressure is well managed. She is due to have diabetic eye exam. Next visit will need to be scheduled as CPE with pap smear. She did get a flu shot at her pharmacy.       Current Medication: Outpatient Encounter Medications as of 02/25/2020  Medication Sig  . amLODipine (NORVASC) 5 MG tablet Take 1 tablet (5 mg total) by mouth daily.  Marland Kitchen aspirin EC 81 MG tablet Take 81 mg by mouth daily.  . hyoscyamine (LEVSIN) 0.125 MG tablet Take 1 tablet (0.125 mg total) by mouth every 6 (six) hours as needed for cramping.  . insulin lispro (HUMALOG KWIKPEN) 100 UNIT/ML KiwkPen Inject into the skin. Use at lunch with sliding scale may use up to 10 units [place]  . Insulin Lispro Prot & Lispro (HUMALOG 75/25 MIX) (75-25) 100 UNIT/ML Kwikpen Inject 24 Units into the skin 2 (two) times daily with a meal.  . insulin lispro protamine-lispro (HUMALOG MIX 75/25) (75-25) 100 UNIT/ML SUSP injection Inject 24 Units into the skin 2 (two) times daily with a meal.  . Insulin Pen Needle (PEN NEEDLES) 32G X 4 MM MISC 1 each by Does not apply route daily.  Marland Kitchen levETIRAcetam (KEPPRA) 500 MG tablet Take 1 tablet (500 mg total) by mouth 2 (two) times daily.  Marland Kitchen levothyroxine (SYNTHROID) 50 MCG tablet Take 1 tablet (50 mcg total) by mouth daily before breakfast.  . losartan (COZAAR) 50 MG tablet Take 1 tablet (50 mg total) by mouth daily.  . nitrofurantoin, macrocrystal-monohydrate, (MACROBID) 100 MG capsule Take 1 capsule (100 mg total) by mouth 2 (two) times daily.  Glory Rose[place]ush ULTRA test strip STRIPS FOR BLOOD SUGAR TESTING 1 TIME  DAILY. DX 250.00  . rOPINIRole (REQUIP) 0.25 MG tablet Take 1 to 2 tablets po QHS prn   No facility-administered encounter medications on file as of 02/25/2020.    Surgical History: Past Surgical History:  Procedure Laterality Date  . CESAREAN SECTION    . COLONOSCOPY WITH PROPOFOL N/A 03/29/2016   Procedure: COLONOSCOPY WITH PROPOFOL;  Surgeon: Josefine Class, MD;  Location: [facility] ENDOSCOPY;  Service: Endoscopy;  Laterality: N/A;  . TUBAL LIGATION      Medical History: Past Medical History:  Diagnosis Date  . Bipolar disorder ([place])   . Depression   . Diabetes mellitus without complication ([place])   . Hyperlipidemia   . Hypertension     Family History: Family History  Problem Relation Age of Onset  . Diabetes Mother   . Hypertension Mother     Social History   Socioeconomic History  . Marital status: Married    Spouse name: Not on file  . Number of children: Not on file  . Years of education: Not on file  . Highest education level: Not on file  Occupational History  . Not on file  Tobacco Use  . Smoking status: Current Every Day Smoker    Types: Cigarettes  . Smokeless tobacco: Never Used  Substance and Sexual Activity  . Alcohol use: Yes  . Drug use: No  . Sexual activity: Not on  file  Other Topics Concern  . Not on file  Social History Narrative  . Not on file   Social Determinants of Health   Financial Resource Strain:   . Difficulty of Paying Living Expenses: Not on file  Food Insecurity:   . Worried About Charity fundraiser in the Last Year: Not on file  . Ran Out of Food in the Last Year: Not on file  Transportation Needs:   . Lack of Transportation (Medical): Not on file  . Lack of Transportation (Non-Medical): Not on file  Physical Activity:   . Days of Exercise per Week: Not on file  . Minutes of Exercise per Session: Not on file  Stress:   . Feeling of Stress : Not on file  Social Connections:   . Frequency of Communication with Friends  and Family: Not on file  . Frequency of Social Gatherings with Friends and Family: Not on file  . Attends Religious Services: Not on file  . Active Member of Clubs or Organizations: Not on file  . Attends Archivist Meetings: Not on file  . Marital Status: Not on file  Intimate Partner Violence:   . Fear of Current or Ex-Partner: Not on file  . Emotionally Abused: Not on file  . Physically Abused: Not on file  . Sexually Abused: Not on file      Review of Systems  Constitutional: Negative for activity change, chills, fatigue and unexpected weight change.  HENT: Negative for congestion, postnasal drip, rhinorrhea, sneezing and sore throat.   Respiratory: Negative for cough, chest tightness, shortness of breath and wheezing.   Cardiovascular: Negative for chest pain and palpitations.  Gastrointestinal: Negative for abdominal pain, constipation, diarrhea, nausea and vomiting.  Endocrine: Negative for cold intolerance, heat intolerance, polydipsia and polyuria.       Blood sugars doing well   Musculoskeletal: Negative for arthralgias, back pain, joint swelling and neck pain.  Skin: Negative for rash.  Allergic/Immunologic: Negative for environmental allergies.  Neurological: Negative for dizziness, tremors, numbness and headaches.  Hematological: Negative for adenopathy. Does not bruise/bleed easily.  Psychiatric/Behavioral: Negative for behavioral problems (Depression), sleep disturbance and suicidal ideas. The patient is not nervous/anxious.    Today's Vitals   02/25/20 0838  BP: 137/87  Pulse: 95  Resp: 16  Temp: (!) 97.5 F (36.4 C)  SpO2: 100%  Weight: 185 lb 12.8 oz (84.3 kg)  Height: 5\' 5"  (1.651 m)   Body mass index is 30.92 kg/m.  Physical Exam Vitals and nursing note reviewed.  Constitutional:      General: She is not in acute distress.    Appearance: Normal appearance. She is well-developed. She is not diaphoretic.  HENT:     Head: Normocephalic and  atraumatic.     Mouth/Throat:     Pharynx: No oropharyngeal exudate.  Eyes:     Pupils: Pupils are equal, round, and reactive to light.  Neck:     Thyroid: No thyromegaly.     Vascular: No carotid bruit or JVD.     Trachea: No tracheal deviation.  Cardiovascular:     Rate and Rhythm: Normal rate and regular rhythm.     Heart sounds: Normal heart sounds. No murmur. No friction rub. No gallop.   Pulmonary:     Effort: Pulmonary effort is normal. No respiratory distress.     Breath sounds: Normal breath sounds. No wheezing or rales.  Chest:     Chest wall: No tenderness.  Abdominal:  Palpations: Abdomen is soft.  Musculoskeletal:        General: Normal range of motion.     Cervical back: Normal range of motion and neck supple.  Skin:    General: Skin is warm and dry.  Neurological:     Mental Status: She is alert and oriented to person, place, and time.     Cranial Nerves: No cranial nerve deficit.  Psychiatric:        Mood and Affect: Mood normal.        Behavior: Behavior normal.        Thought Content: Thought content normal.        Judgment: Judgment normal.    Assessment/Plan: 1. Uncontrolled type 2 diabetes mellitus with hyperglycemia (HCC) - POCT HgB A1C 5.0 today. Continue diabetic medication as prescribed. Refer for diabetic eye exam.  - Ambulatory referral to Ophthalmology  2. Essential hypertension Stable. Continue BP medication as prescribed   3. Acquired hypothyroidism Continue levothyroxine as prescribed   General Counseling: Alayla verbalizes understanding of the findings of todays visit and agrees with plan of treatment. I have discussed any further diagnostic evaluation that may be needed or ordered today. We also reviewed her medications today. she has been encouraged to call the office with any questions or concerns that should arise related to todays visit.  Diabetes Counseling:  1. Addition of ACE inh/ ARB'S for nephroprotection. Microalbumin is  updated  2. Diabetic foot care, prevention of complications. Podiatry consult 3. Exercise and lose weight.  4. Diabetic eye examination, Diabetic eye exam is updated  5. Monitor blood sugar closlely. nutrition counseling.  6. Sign and symptoms of hypoglycemia including shaking sweating,confusion and headaches.  This patient was seen by Leretha Pol FNP Collaboration with Dr Lavera Guise as a part of collaborative care agreement  Orders Placed This Encounter  Procedures  . Ambulatory referral to Ophthalmology  . POCT HgB A1C     Total time spent: 30 Minutes   Time spent includes review of chart, medications, test results, and follow up plan with the patient.      Dr Lavera Guise Internal medicine

## 2020-03-14 ENCOUNTER — Other Ambulatory Visit: Payer: Self-pay

## 2020-03-14 DIAGNOSIS — K580 Irritable bowel syndrome with diarrhea: Secondary | ICD-10-CM

## 2020-03-14 MED ORDER — HYOSCYAMINE SULFATE 0.125 MG PO TABS: 0.1250 mg | ORAL_TABLET | Freq: Four times a day (QID) | ORAL | 2 refills | Status: DC | PRN

## 2020-03-17 ENCOUNTER — Ambulatory Visit: Payer: Managed Care, Other (non HMO) | Attending: Internal Medicine

## 2020-03-17 DIAGNOSIS — Z23 Encounter for immunization: Secondary | ICD-10-CM

## 2020-03-17 NOTE — Progress Notes (Signed)
   Covid-19 Vaccination Clinic  Name:  Abigail Rice    MRN: [id number] DOB: 1966/07/20  03/17/2020  [name] was observed post Covid-19 immunization for 15 minutes without incident. She was provided with Vaccine Information Sheet and instruction to access the V-Safe system.   [name] was instructed to call 911 with any severe reactions post vaccine: Marland Kitchen Difficulty breathing  . Swelling of face and throat  . A fast heartbeat  . A bad rash all over body  . Dizziness and weakness   Immunizations Administered    Name Date Dose VIS Date Route   Pfizer COVID-19 Vaccine 03/17/2020 10:34 AM 0.3 mL 12/03/2019 Intramuscular   Manufacturer: [place]   Lot: [id number]   [place]: [id number]

## 2020-03-22 ENCOUNTER — Telehealth: Payer: Self-pay

## 2020-03-22 NOTE — Telephone Encounter (Signed)
Patient has been advised paperwork ready up front. Abigail Rice

## 2020-03-30 ENCOUNTER — Other Ambulatory Visit: Payer: Self-pay

## 2020-03-30 DIAGNOSIS — E039 Hypothyroidism, unspecified: Secondary | ICD-10-CM

## 2020-03-30 MED ORDER — LEVOTHYROXINE SODIUM 50 MCG PO TABS: 50.0000 ug | ORAL_TABLET | Freq: Every day | ORAL | 1 refills | Status: DC

## 2020-04-10 ENCOUNTER — Other Ambulatory Visit: Payer: Self-pay

## 2020-04-10 DIAGNOSIS — R569 Unspecified convulsions: Secondary | ICD-10-CM

## 2020-04-10 DIAGNOSIS — G2581 Restless legs syndrome: Secondary | ICD-10-CM

## 2020-04-10 MED ORDER — ROPINIROLE HCL 0.25 MG PO TABS: ORAL_TABLET | ORAL | 3 refills | Status: DC

## 2020-04-10 MED ORDER — LEVETIRACETAM 500 MG PO TABS: 500.0000 mg | ORAL_TABLET | Freq: Two times a day (BID) | ORAL | 1 refills | Status: DC

## 2020-04-11 ENCOUNTER — Ambulatory Visit: Payer: Managed Care, Other (non HMO) | Attending: Internal Medicine

## 2020-04-11 DIAGNOSIS — Z23 Encounter for immunization: Secondary | ICD-10-CM

## 2020-04-11 NOTE — Progress Notes (Signed)
   Covid-19 Vaccination Clinic  Name:  Abigail Rice    MRN: [id number] DOB: [birth year]  04/11/2020  Abigail Rice was observed post Covid-19 immunization for 15 minutes without incident. She was provided with Vaccine Information Sheet and instruction to access the V-Safe system.   Abigail Rice was instructed to call 911 with any severe reactions post vaccine: Marland Kitchen Difficulty breathing  . Swelling of face and throat  . A fast heartbeat  . A bad rash all over body  . Dizziness and weakness   Immunizations Administered    Name Date Dose VIS Date Route   Pfizer COVID-19 Vaccine 04/11/2020  8:16 AM 0.3 mL 02/16/2019 Intramuscular   Manufacturer: [employer]   Lot: [id number]   [place]: [id number]

## 2020-04-12 ENCOUNTER — Ambulatory Visit: Payer: Managed Care, Other (non HMO)

## 2020-05-11 ENCOUNTER — Other Ambulatory Visit: Payer: Self-pay

## 2020-05-11 MED ORDER — INSULIN LISPRO PROT & LISPRO (75-25 MIX) 100 UNIT/ML KWIKPEN: 24.0000 [IU] | PEN_INJECTOR | Freq: Two times a day (BID) | SUBCUTANEOUS | 3 refills | Status: DC

## 2020-05-16 ENCOUNTER — Other Ambulatory Visit: Payer: Self-pay

## 2020-05-16 DIAGNOSIS — I10 Essential (primary) hypertension: Secondary | ICD-10-CM

## 2020-05-16 MED ORDER — AMLODIPINE BESYLATE 5 MG PO TABS: 5.0000 mg | ORAL_TABLET | Freq: Every day | ORAL | 1 refills | Status: DC

## 2020-05-24 ENCOUNTER — Telehealth: Payer: Self-pay

## 2020-05-24 NOTE — Telephone Encounter (Signed)
Confirmed and screened for 05-26-20 ov. 

## 2020-05-26 ENCOUNTER — Encounter: Payer: Self-pay | Admitting: Nurse Practitioner

## 2020-05-26 ENCOUNTER — Other Ambulatory Visit: Payer: Self-pay

## 2020-05-26 ENCOUNTER — Ambulatory Visit (INDEPENDENT_AMBULATORY_CARE_PROVIDER_SITE_OTHER): Payer: Managed Care, Other (non HMO) | Admitting: Nurse Practitioner

## 2020-05-26 VITALS — BP 97/64 | HR 94 | Temp 97.5°F | Resp 16 | Ht 65.0 in | Wt 179.0 lb

## 2020-05-26 DIAGNOSIS — R569 Unspecified convulsions: Secondary | ICD-10-CM

## 2020-05-26 DIAGNOSIS — R252 Cramp and spasm: Secondary | ICD-10-CM | POA: Insufficient documentation

## 2020-05-26 DIAGNOSIS — Z0001 Encounter for general adult medical examination with abnormal findings: Secondary | ICD-10-CM

## 2020-05-26 DIAGNOSIS — I10 Essential (primary) hypertension: Secondary | ICD-10-CM | POA: Diagnosis not present

## 2020-05-26 DIAGNOSIS — E1165 Type 2 diabetes mellitus with hyperglycemia: Secondary | ICD-10-CM

## 2020-05-26 DIAGNOSIS — R300 Dysuria: Secondary | ICD-10-CM

## 2020-05-26 DIAGNOSIS — Z1231 Encounter for screening mammogram for malignant neoplasm of breast: Secondary | ICD-10-CM

## 2020-05-26 LAB — POCT GLYCOSYLATED HEMOGLOBIN (HGB A1C): Hemoglobin A1C: 4.9 % (ref 4.0–5.6)

## 2020-05-26 MED ORDER — HUMALOG MIX 75/25 (75-25) 100 UNIT/ML ~~LOC~~ SUSP: 20.0000 [IU] | Freq: Two times a day (BID) | SUBCUTANEOUS | 3 refills | Status: DC

## 2020-05-26 NOTE — Progress Notes (Signed)
[facility] [place], [place] [address]  Internal MEDICINE  Office Visit Note  Patient Name: Abigail Rice  [id number]  [id number]  Date of Service: 05/26/2020   Pt is here for routine health maintenance examination  Chief Complaint  Patient presents with   Annual Exam   Diabetes   Depression   Hypertension   Paperwork    needs a permanent handicap placard     The patient is here for health maintenance exam. She states that she continues to have muscle cramps in both legs  Will "lock up" on her when she is walking from her car into work. She does have a temporary handicap placard due to seizures and muscle cramps. He is afraid she will fall while walking into her work place. She has been seizure free for over six months. She continues to take Keppra without negative side effects. She is due to have check of routine, fasting labs. She is also due for mammogram.     Current Medication: Outpatient Encounter Medications as of 05/26/2020  Medication Sig   amLODipine (NORVASC) 5 MG tablet Take 1 tablet (5 mg total) by mouth daily.   aspirin EC 81 MG tablet Take 81 mg by mouth daily.   hyoscyamine (LEVSIN) 0.125 MG tablet Take 1 tablet (0.125 mg total) by mouth every 6 (six) hours as needed for cramping.   insulin lispro (HUMALOG KWIKPEN) 100 UNIT/ML KiwkPen Inject into the skin. Use at lunch with sliding scale may use up to 10 units    Insulin Pen Needle (PEN NEEDLES) 32G X 4 MM MISC 1 each by Does not apply route daily.   levETIRAcetam (KEPPRA) 500 MG tablet Take 1 tablet (500 mg total) by mouth 2 (two) times daily.   levothyroxine (SYNTHROID) 50 MCG tablet Take 1 tablet (50 mcg total) by mouth daily before breakfast.   losartan (COZAAR) 50 MG tablet Take 1 tablet (50 mg total) by mouth daily.   ONETOUCH ULTRA test strip STRIPS FOR BLOOD SUGAR TESTING 1 TIME DAILY. DX 250.00   POTASSIUM PO Take by mouth.   rOPINIRole (REQUIP) 0.25 MG tablet  Take 1 to 2 tablets po QHS prn   [DISCONTINUED] Insulin Lispro Prot & Lispro (HUMALOG 75/25 MIX) (75-25) 100 UNIT/ML Kwikpen Inject 24 Units into the skin 2 (two) times daily with a meal.   [DISCONTINUED] insulin lispro protamine-lispro (HUMALOG MIX 75/25) (75-25) 100 UNIT/ML SUSP injection Inject 24 Units into the skin 2 (two) times daily with a meal.   insulin lispro protamine-lispro (HUMALOG MIX 75/25) (75-25) 100 UNIT/ML SUSP injection Inject 20 Units into the skin 2 (two) times daily with a meal.   [DISCONTINUED] nitrofurantoin, macrocrystal-monohydrate, (MACROBID) 100 MG capsule Take 1 capsule (100 mg total) by mouth 2 (two) times daily. (Patient not taking: Reported on 05/26/2020)   No facility-administered encounter medications on file as of 05/26/2020.    Surgical History: Past Surgical History:  Procedure Laterality Date   CESAREAN SECTION     COLONOSCOPY WITH PROPOFOL N/A 03/29/2016   Procedure: COLONOSCOPY WITH PROPOFOL;  Surgeon: Josefine Class, MD;  Location: [facility] ENDOSCOPY;  Service: Endoscopy;  Laterality: N/A;   TUBAL LIGATION      Medical History: Past Medical History:  Diagnosis Date   Bipolar disorder ([place])    Depression    Diabetes mellitus without complication ([place])    Hyperlipidemia    Hypertension     Family History: Family History  Problem Relation Age of Onset  Diabetes Mother    Hypertension Mother       Review of Systems  Constitutional: Negative for activity change, chills, fatigue and unexpected weight change.  HENT: Negative for congestion, postnasal drip, rhinorrhea, sneezing and sore throat.   Respiratory: Negative for cough, chest tightness, shortness of breath and wheezing.   Cardiovascular: Negative for chest pain and palpitations.  Gastrointestinal: Negative for abdominal pain, constipation, diarrhea, nausea and vomiting.  Endocrine: Negative for cold intolerance, heat intolerance, polydipsia and polyuria.       Blood  sugars doing well   Genitourinary: Negative for dysuria, frequency and urgency.  Musculoskeletal: Positive for myalgias. Negative for arthralgias, back pain, joint swelling and neck pain.       Leg cramps  Skin: Negative for rash.  Allergic/Immunologic: Negative for environmental allergies.  Neurological: Positive for dizziness, seizures and headaches. Negative for tremors and numbness.       Has been seizure free for over six months.   Hematological: Negative for adenopathy. Does not bruise/bleed easily.  Psychiatric/Behavioral: Negative for behavioral problems (Depression), sleep disturbance and suicidal ideas. The patient is nervous/anxious.      Today's Vitals   05/26/20 0832  BP: 97/64  Pulse: 94  Resp: 16  Temp: (!) 97.5 F (36.4 C)  SpO2: 99%  Weight: 179 lb (81.2 kg)  Height: 5\' 5"  (1.651 m)   Body mass index is 29.79 kg/m.  Physical Exam Vitals and nursing note reviewed.  Constitutional:      General: She is not in acute distress.    Appearance: Normal appearance. She is well-developed. She is not diaphoretic.  HENT:     Head: Normocephalic and atraumatic.     Nose: Nose normal.     Mouth/Throat:     Pharynx: No oropharyngeal exudate.  Eyes:     Pupils: Pupils are equal, round, and reactive to light.  Neck:     Thyroid: No thyromegaly.     Vascular: No carotid bruit or JVD.     Trachea: No tracheal deviation.  Cardiovascular:     Rate and Rhythm: Normal rate and regular rhythm.     Pulses: Normal pulses.          Dorsalis pedis pulses are 2+ on the right side and 2+ on the left side.       Posterior tibial pulses are 2+ on the right side and 2+ on the left side.     Heart sounds: Normal heart sounds. No murmur. No friction rub. No gallop.   Pulmonary:     Effort: Pulmonary effort is normal. No respiratory distress.     Breath sounds: Normal breath sounds. No wheezing or rales.  Chest:     Chest wall: No tenderness.     Breasts:        Right: No  swelling, bleeding, inverted nipple, mass, nipple discharge, skin change or tenderness.        Left: Normal. No swelling, bleeding, inverted nipple, mass, nipple discharge, skin change or tenderness.  Abdominal:     General: Bowel sounds are normal.     Palpations: Abdomen is soft.     Tenderness: There is no abdominal tenderness.  Musculoskeletal:        General: Normal range of motion.     Cervical back: Normal range of motion and neck supple.     Right foot: Normal range of motion. No deformity or bunion.     Left foot: Normal range of motion. No deformity or bunion.  Feet:     Right foot:     Protective Sensation: 10 sites tested. 10 sites sensed.     Skin integrity: Skin integrity normal.     Toenail Condition: Right toenails are normal.     Left foot:     Protective Sensation: 10 sites tested. 10 sites sensed.     Skin integrity: Skin integrity normal.     Toenail Condition: Left toenails are normal.  Lymphadenopathy:     Upper Body:     Right upper body: No axillary adenopathy.     Left upper body: No axillary adenopathy.  Skin:    General: Skin is warm and dry.  Neurological:     Mental Status: She is alert and oriented to person, place, and time.     Cranial Nerves: No cranial nerve deficit.  Psychiatric:        Mood and Affect: Mood normal.        Behavior: Behavior normal.        Thought Content: Thought content normal.        Judgment: Judgment normal.      LABS: Recent Results (from the past 2160 hour(s))  POCT HgB A1C     Status: Normal   Collection Time: 05/26/20  9:04 AM  Result Value Ref Range   Hemoglobin A1C 4.9 4.0 - 5.6 %   HbA1c POC (<> result, manual entry)     HbA1c, POC (prediabetic range)     HbA1c, POC (controlled diabetic range)      Assessment/Plan: 1. Encounter for general adult medical examination with abnormal findings Annual health maintenance exam today. Lab slip given to check routine, fasting labs.   2. Uncontrolled type 2  diabetes mellitus with hyperglycemia (HCC) - POCT HgB A1C 4.9 today. Reduce dose of humalog 75/25 to 20 units twice daily. Continue to monitor blood sugars closely. Consider reducing further at next visit as indicated  - insulin lispro protamine-lispro (HUMALOG MIX 75/25) (75-25) 100 UNIT/ML SUSP injection; Inject 20 Units into the skin 2 (two) times daily with a meal.  Dispense: 10 mL; Refill: 3  3. Essential hypertension Stable, on low side of normal. Continue medications as prescribed. Consider reducing meds as indicated.   4. Muscle cramps Check electrolytes along with magnesium and phosphorus levels for further evaluation  5. Seizure [facility]) Patient has been seizure free for well over six months. Continue keppra as prescribed.   6. Encounter for screening mammogram for malignant neoplasm of breast - MM DIGITAL SCREENING BILATERAL; Future  7. Dysuria - Urinalysis, Routine w reflex microscopic  General Counseling: Ronan verbalizes understanding of the findings of todays visit and agrees with plan of treatment. I have discussed any further diagnostic evaluation that may be needed or ordered today. We also reviewed her medications today. she has been encouraged to call the office with any questions or concerns that should arise related to todays visit.    Counseling:  Diabetes Counseling:  1. Addition of ACE inh/ ARB'S for nephroprotection. Microalbumin is updated  2. Diabetic foot care, prevention of complications. Podiatry consult 3. Exercise and lose weight.  4. Diabetic eye examination, Diabetic eye exam is updated  5. Monitor blood sugar closlely. nutrition counseling.  6. Sign and symptoms of hypoglycemia including shaking sweating,confusion and headaches.  This patient was seen by Leretha Pol FNP Collaboration with Dr Lavera Guise as a part of collaborative care agreement  Orders Placed This Encounter  Procedures   MM DIGITAL SCREENING BILATERAL  Urinalysis, Routine w  reflex microscopic   POCT HgB A1C    Meds ordered this encounter  Medications   insulin lispro protamine-lispro (HUMALOG MIX 75/25) (75-25) 100 UNIT/ML SUSP injection    Sig: Inject 20 Units into the skin 2 (two) times daily with a meal.    Dispense:  10 mL    Refill:  3    Decreased dose to 20 units twice daily    Order Specific Question:   Supervising Provider    Answer:   Lavera Guise [[id number]]    Total time spent: 64 Minutes  Time spent includes review of chart, medications, test results, and follow up plan with the patient.     Lavera Guise, MD  Internal Medicine

## 2020-05-27 LAB — URINALYSIS, ROUTINE W REFLEX MICROSCOPIC
Bilirubin, UA: NEGATIVE
Glucose, UA: NEGATIVE
Ketones, UA: NEGATIVE
Nitrite, UA: NEGATIVE
Protein,UA: NEGATIVE
Specific Gravity, UA: 1.01 (ref 1.005–1.030)
Urobilinogen, Ur: 0.2 mg/dL (ref 0.2–1.0)
pH, UA: 5 (ref 5.0–7.5)

## 2020-05-27 LAB — MICROSCOPIC EXAMINATION
Casts: NONE SEEN /lpf
Epithelial Cells (non renal): NONE SEEN /hpf (ref 0–10)
RBC, Urine: NONE SEEN /hpf (ref 0–2)
WBC, UA: >30 /hpf — AB (ref 0–5)

## 2020-06-15 ENCOUNTER — Other Ambulatory Visit: Payer: Self-pay

## 2020-06-15 DIAGNOSIS — K580 Irritable bowel syndrome with diarrhea: Secondary | ICD-10-CM

## 2020-06-15 MED ORDER — HYOSCYAMINE SULFATE 0.125 MG PO TABS: 0.1250 mg | ORAL_TABLET | Freq: Four times a day (QID) | ORAL | 2 refills | Status: DC | PRN

## 2020-08-03 ENCOUNTER — Telehealth: Payer: Self-pay

## 2020-08-03 NOTE — Telephone Encounter (Signed)
Disability parking placard paper signed and placed at front desk. Tried to advise patient but voicemail was not activated.

## 2020-08-22 ENCOUNTER — Encounter: Payer: Self-pay | Admitting: Nurse Practitioner

## 2020-08-22 ENCOUNTER — Other Ambulatory Visit: Payer: Self-pay | Admitting: Nurse Practitioner

## 2020-08-22 ENCOUNTER — Ambulatory Visit: Payer: Managed Care, Other (non HMO) | Admitting: Nurse Practitioner

## 2020-08-22 ENCOUNTER — Other Ambulatory Visit: Payer: Self-pay

## 2020-08-22 VITALS — BP 96/76 | HR 73 | Temp 97.9°F | Resp 16 | Ht 65.0 in | Wt 179.0 lb

## 2020-08-22 DIAGNOSIS — L03119 Cellulitis of unspecified part of limb: Secondary | ICD-10-CM

## 2020-08-22 DIAGNOSIS — R569 Unspecified convulsions: Secondary | ICD-10-CM

## 2020-08-22 DIAGNOSIS — E119 Type 2 diabetes mellitus without complications: Secondary | ICD-10-CM

## 2020-08-22 DIAGNOSIS — I10 Essential (primary) hypertension: Secondary | ICD-10-CM

## 2020-08-22 DIAGNOSIS — Z794 Long term (current) use of insulin: Secondary | ICD-10-CM

## 2020-08-22 DIAGNOSIS — E1165 Type 2 diabetes mellitus with hyperglycemia: Secondary | ICD-10-CM | POA: Diagnosis not present

## 2020-08-22 DIAGNOSIS — L02619 Cutaneous abscess of unspecified foot: Secondary | ICD-10-CM

## 2020-08-22 DIAGNOSIS — E039 Hypothyroidism, unspecified: Secondary | ICD-10-CM

## 2020-08-22 LAB — POCT GLYCOSYLATED HEMOGLOBIN (HGB A1C): Hemoglobin A1C: 5.4 % (ref 4.0–5.6)

## 2020-08-22 MED ORDER — DOXYCYCLINE HYCLATE 100 MG PO TABS: 100.0000 mg | ORAL_TABLET | Freq: Two times a day (BID) | ORAL | 0 refills | Status: DC

## 2020-08-22 NOTE — Progress Notes (Signed)
[facility] [place], [place] [address]  Internal MEDICINE  Office Visit Note  Patient Name: Abigail Rice  [id number]  [id number]  Date of Service: 09/03/2020  Chief Complaint  Patient presents with  . Follow-up  . Foot Pain    left foot pain  . Depression  . Diabetes  . Hyperlipidemia  . Hypertension    The patient is here for routine follow up. She states that she is having swelling and tenderness of the left foot. Has been going on for about a week. There is noted area of swelling along the dorsal aspect of the left foot. She states this is tender. She does not remember specific injury. Feels like she may have been bit by something where she works.  Dosing on insulin was decreased at her last visit. We lowered dose from 22 units to 20 units twice daily. Blood pressure on low side of normal today, but generally well managed.  She is due to have routine, fasting labs done. She had screening mammogram done 08/17/2020 and results were negative.       Current Medication: Outpatient Encounter Medications as of 08/22/2020  Medication Sig  . amLODipine (NORVASC) 5 MG tablet Take 1 tablet (5 mg total) by mouth daily.  Marland Kitchen aspirin EC 81 MG tablet Take 81 mg by mouth daily.  . hyoscyamine (LEVSIN) 0.125 MG tablet Take 1 tablet (0.125 mg total) by mouth every 6 (six) hours as needed for cramping.  . insulin lispro (HUMALOG KWIKPEN) 100 UNIT/ML KiwkPen Inject into the skin. Use at lunch with sliding scale may use up to 10 units [place]  . insulin lispro protamine-lispro (HUMALOG MIX 75/25) (75-25) 100 UNIT/ML SUSP injection Inject 20 Units into the skin 2 (two) times daily with a meal.  . Insulin Pen Needle (PEN NEEDLES) 32G X 4 MM MISC 1 each by Does not apply route daily.  Marland Kitchen levETIRAcetam (KEPPRA) 500 MG tablet Take 1 tablet (500 mg total) by mouth 2 (two) times daily.  Marland Kitchen levothyroxine (SYNTHROID) 50 MCG tablet Take 1 tablet (50 mcg total) by mouth daily before  breakfast.  . losartan (COZAAR) 50 MG tablet Take 1 tablet (50 mg total) by mouth daily.  Glory Rosebush ULTRA test strip STRIPS FOR BLOOD SUGAR TESTING 1 TIME DAILY. DX 250.00  . POTASSIUM PO Take by mouth.  Marland Kitchen rOPINIRole (REQUIP) 0.25 MG tablet Take 1 to 2 tablets po QHS prn  . doxycycline (VIBRA-TABS) 100 MG tablet Take 1 tablet (100 mg total) by mouth 2 (two) times daily.   No facility-administered encounter medications on file as of 08/22/2020.    Surgical History: Past Surgical History:  Procedure Laterality Date  . CESAREAN SECTION    . COLONOSCOPY WITH PROPOFOL N/A 03/29/2016   Procedure: COLONOSCOPY WITH PROPOFOL;  Surgeon: Josefine Class, MD;  Location: [facility] ENDOSCOPY;  Service: Endoscopy;  Laterality: N/A;  . TUBAL LIGATION      Medical History: Past Medical History:  Diagnosis Date  . Bipolar disorder ([place])   . Depression   . Diabetes mellitus without complication ([place])   . Hyperlipidemia   . Hypertension     Family History: Family History  Problem Relation Age of Onset  . Diabetes Mother   . Hypertension Mother     Social History   Socioeconomic History  . Marital status: Married    Spouse name: Not on file  . Number of children: Not on file  . Years of education: Not on file  .  Highest education level: Not on file  Occupational History  . Not on file  Tobacco Use  . Smoking status: Current Every Day Smoker    Types: Cigarettes  . Smokeless tobacco: Never Used  Substance and Sexual Activity  . Alcohol use: Yes  . Drug use: No  . Sexual activity: Not on file  Other Topics Concern  . Not on file  Social History Narrative  . Not on file   Social Determinants of Health   Financial Resource Strain:   . Difficulty of Paying Living Expenses: Not on file  Food Insecurity:   . Worried About Charity fundraiser in the Last Year: Not on file  . Ran Out of Food in the Last Year: Not on file  Transportation Needs:   . Lack of Transportation (Medical):  Not on file  . Lack of Transportation (Non-Medical): Not on file  Physical Activity:   . Days of Exercise per Week: Not on file  . Minutes of Exercise per Session: Not on file  Stress:   . Feeling of Stress : Not on file  Social Connections:   . Frequency of Communication with Friends and Family: Not on file  . Frequency of Social Gatherings with Friends and Family: Not on file  . Attends Religious Services: Not on file  . Active Member of Clubs or Organizations: Not on file  . Attends Archivist Meetings: Not on file  . Marital Status: Not on file  Intimate Partner Violence:   . Fear of Current or Ex-Partner: Not on file  . Emotionally Abused: Not on file  . Physically Abused: Not on file  . Sexually Abused: Not on file      Review of Systems  Constitutional: Negative for activity change, chills, fatigue and unexpected weight change.  HENT: Negative for congestion, postnasal drip, rhinorrhea, sneezing and sore throat.   Respiratory: Negative for cough, chest tightness, shortness of breath and wheezing.   Cardiovascular: Positive for leg swelling. Negative for chest pain and palpitations.       Left foot swelling with warmth and tenderness present.   Gastrointestinal: Negative for abdominal pain, constipation, diarrhea, nausea and vomiting.  Endocrine: Negative for cold intolerance, heat intolerance, polydipsia and polyuria.       Blood sugars doing well   Musculoskeletal: Positive for myalgias. Negative for arthralgias, back pain, joint swelling and neck pain.       Leg cramps  Skin: Negative for rash.       Swelling, redness, and pain of the dorsal aspect of left foot.   Allergic/Immunologic: Negative for environmental allergies.  Neurological: Positive for dizziness and seizures. Negative for tremors, numbness and headaches.       Has been seizure free for over six months.   Hematological: Negative for adenopathy. Does not bruise/bleed easily.   Psychiatric/Behavioral: Negative for behavioral problems (Depression), sleep disturbance and suicidal ideas. The patient is nervous/anxious.     Today's Vitals   08/22/20 1553  BP: 96/76  Pulse: 73  Resp: 16  Temp: 97.9 F (36.6 C)  SpO2: 99%  Weight: 179 lb (81.2 kg)  Height: 5\' 5"  (1.651 m)   Body mass index is 29.79 kg/m.  Physical Exam Vitals and nursing note reviewed.  Constitutional:      General: She is not in acute distress.    Appearance: Normal appearance. She is well-developed. She is not diaphoretic.  HENT:     Head: Normocephalic and atraumatic.     Nose:  Nose normal.     Mouth/Throat:     Pharynx: No oropharyngeal exudate.  Eyes:     Pupils: Pupils are equal, round, and reactive to light.  Neck:     Thyroid: No thyromegaly.     Vascular: No carotid bruit or JVD.     Trachea: No tracheal deviation.  Cardiovascular:     Rate and Rhythm: Normal rate and regular rhythm.     Heart sounds: Normal heart sounds. No murmur heard.  No friction rub. No gallop.   Pulmonary:     Effort: Pulmonary effort is normal. No respiratory distress.     Breath sounds: Normal breath sounds. No wheezing or rales.  Chest:     Chest wall: No tenderness.  Abdominal:     Palpations: Abdomen is soft.  Musculoskeletal:        General: Normal range of motion.     Cervical back: Normal range of motion and neck supple.  Lymphadenopathy:     Cervical: No cervical adenopathy.  Skin:    General: Skin is warm and dry.     Comments: There is swelling, tenderness, and warmth along the dorsal aspect of the left foot, along the proximal joints of the third, fourth, and fifth toes. Appears to be a small lesion adjacent to the proximal joint of the fourth toe. ROM of the toes is intact. Pedal pulses are palpable.   Neurological:     General: No focal deficit present.     Mental Status: She is alert and oriented to person, place, and time.     Cranial Nerves: No cranial nerve deficit.   Psychiatric:        Mood and Affect: Mood normal.        Behavior: Behavior normal.        Thought Content: Thought content normal.        Judgment: Judgment normal.    Assessment/Plan: 1. Type 2 diabetes mellitus without complication, with long-term current use of insulin (HCC) - POCT HgB A1C 5.2 today. Will continue to lower basal insulin dosing in small increments until she is able to come off basal insulin altogether.  2. Cellulitis and abscess of foot Start doxycycline 100mg  twice daily for next 14 days. Advised her to elevate her foot when possible. May apply ice or cool compress to the foot to help reduce pain and inflammation.  - doxycycline (VIBRA-TABS) 100 MG tablet; Take 1 tablet (100 mg total) by mouth 2 (two) times daily.  Dispense: 28 tablet; Refill: 0  3. Essential hypertension Slightly low today, but generally well controlled. Continue bp medication as prescribed.   4. Acquired hypothyroidism Check thyroid panel along with other routine, fasting labs. Adjust levothyroxine dose as indicated.   5. Seizures ([place]) Continue use of keppra as prescribed. Regular visits with neurology as scheduled.   General Counseling: Haedyn verbalizes understanding of the findings of todays visit and agrees with plan of treatment. I have discussed any further diagnostic evaluation that may be needed or ordered today. We also reviewed her medications today. she has been encouraged to call the office with any questions or concerns that should arise related to todays visit.  Diabetes Counseling:  1. Addition of ACE inh/ ARB'S for nephroprotection. Microalbumin is updated  2. Diabetic foot care, prevention of complications. Podiatry consult 3. Exercise and lose weight.  4. Diabetic eye examination, Diabetic eye exam is updated  5. Monitor blood sugar closlely. nutrition counseling.  6. Sign and symptoms of hypoglycemia including  shaking sweating,confusion and headaches.  This patient was seen  by Leretha Pol FNP Collaboration with Dr Lavera Guise as a part of collaborative care agreement  Orders Placed This Encounter  Procedures  . POCT HgB A1C    Meds ordered this encounter  Medications  . doxycycline (VIBRA-TABS) 100 MG tablet    Sig: Take 1 tablet (100 mg total) by mouth 2 (two) times daily.    Dispense:  28 tablet    Refill:  0    Order Specific Question:   Supervising Provider    Answer:   Lavera Guise [[id number]]    Total time spent: 30 Minutes   Time spent includes review of chart, medications, test results, and follow up plan with the patient.      Dr Lavera Guise Internal medicine

## 2020-08-23 LAB — CBC
Hematocrit: 30.5 % — ABNORMAL LOW (ref 34.0–46.6)
Hemoglobin: 10.8 g/dL — ABNORMAL LOW (ref 11.1–15.9)
MCH: 34 pg — ABNORMAL HIGH (ref 26.6–33.0)
MCHC: 35.4 g/dL (ref 31.5–35.7)
MCV: 96 fL (ref 79–97)
Platelets: 59 10*3/uL — CL (ref 150–450)
RBC: 3.18 x10E6/uL — ABNORMAL LOW (ref 3.77–5.28)
RDW: 12 % (ref 11.7–15.4)
WBC: 5 10*3/uL (ref 3.4–10.8)

## 2020-08-23 LAB — COMPREHENSIVE METABOLIC PANEL
ALT: 57 IU/L — ABNORMAL HIGH (ref 0–32)
AST: 69 IU/L — ABNORMAL HIGH (ref 0–40)
Albumin/Globulin Ratio: 0.9 — ABNORMAL LOW (ref 1.2–2.2)
Albumin: 3.4 g/dL — ABNORMAL LOW (ref 3.8–4.9)
Alkaline Phosphatase: 205 IU/L — ABNORMAL HIGH (ref 48–121)
BUN/Creatinine Ratio: 14 (ref 9–23)
BUN: 14 mg/dL (ref 6–24)
Bilirubin Total: 0.6 mg/dL (ref 0.0–1.2)
CO2: 22 mmol/L (ref 20–29)
Calcium: 8.4 mg/dL — ABNORMAL LOW (ref 8.7–10.2)
Chloride: 102 mmol/L (ref 96–106)
Creatinine, Ser: 1.03 mg/dL — ABNORMAL HIGH (ref 0.57–1.00)
GFR calc Af Amer: 71 mL/min/{1.73_m2} (ref >59)
GFR calc non Af Amer: 62 mL/min/{1.73_m2} (ref >59)
Globulin, Total: 3.7 g/dL (ref 1.5–4.5)
Glucose: 150 mg/dL — ABNORMAL HIGH (ref 65–99)
Potassium: 4.2 mmol/L (ref 3.5–5.2)
Sodium: 135 mmol/L (ref 134–144)
Total Protein: 7.1 g/dL (ref 6.0–8.5)

## 2020-08-23 LAB — LIPID PANEL WITH LDL/HDL RATIO
Cholesterol, Total: 114 mg/dL (ref 100–199)
HDL: 23 mg/dL — ABNORMAL LOW (ref >39)
LDL Chol Calc (NIH): 43 mg/dL (ref 0–99)
LDL/HDL Ratio: 1.9 ratio (ref 0.0–3.2)
Triglycerides: 313 mg/dL — ABNORMAL HIGH (ref 0–149)
VLDL Cholesterol Cal: 48 mg/dL — ABNORMAL HIGH (ref 5–40)

## 2020-08-23 LAB — MAGNESIUM: Magnesium: 1.2 mg/dL — ABNORMAL LOW (ref 1.6–2.3)

## 2020-08-23 LAB — PHOSPHORUS: Phosphorus: 3.1 mg/dL (ref 3.0–4.3)

## 2020-08-23 LAB — TSH: TSH: 2.42 u[IU]/mL (ref 0.450–4.500)

## 2020-08-23 LAB — VITAMIN D 25 HYDROXY (VIT D DEFICIENCY, FRACTURES): Vit D, 25-Hydroxy: 26.3 ng/mL — ABNORMAL LOW (ref 30.0–100.0)

## 2020-08-23 LAB — T4, FREE: Free T4: 1.05 ng/dL (ref 0.82–1.77)

## 2020-08-25 ENCOUNTER — Ambulatory Visit: Payer: Managed Care, Other (non HMO) | Admitting: Nurse Practitioner

## 2020-08-31 ENCOUNTER — Other Ambulatory Visit: Payer: Self-pay | Admitting: Internal Medicine

## 2020-08-31 DIAGNOSIS — D6959 Other secondary thrombocytopenia: Secondary | ICD-10-CM

## 2020-08-31 DIAGNOSIS — T50905A Adverse effect of unspecified drugs, medicaments and biological substances, initial encounter: Secondary | ICD-10-CM

## 2020-09-01 ENCOUNTER — Telehealth: Payer: Self-pay

## 2020-09-01 NOTE — Telephone Encounter (Signed)
Spoke with pt. She is feeling better and did her lab work this morning.

## 2020-09-02 LAB — CBC WITH DIFFERENTIAL
Basophils Absolute: 0 10*3/uL (ref 0.0–0.2)
Basos: 1 %
EOS (ABSOLUTE): 0 10*3/uL (ref 0.0–0.4)
Eos: 0 %
Hematocrit: 31.6 % — ABNORMAL LOW (ref 34.0–46.6)
Hemoglobin: 11.2 g/dL (ref 11.1–15.9)
Immature Grans (Abs): 0 10*3/uL (ref 0.0–0.1)
Immature Granulocytes: 0 %
Lymphocytes Absolute: 1.5 10*3/uL (ref 0.7–3.1)
Lymphs: 30 %
MCH: 33.5 pg — ABNORMAL HIGH (ref 26.6–33.0)
MCHC: 35.4 g/dL (ref 31.5–35.7)
MCV: 95 fL (ref 79–97)
Monocytes Absolute: 0.4 10*3/uL (ref 0.1–0.9)
Monocytes: 9 %
Neutrophils Absolute: 2.9 10*3/uL (ref 1.4–7.0)
Neutrophils: 60 %
RBC: 3.34 x10E6/uL — ABNORMAL LOW (ref 3.77–5.28)
RDW: 12 % (ref 11.7–15.4)
WBC: 4.8 10*3/uL (ref 3.4–10.8)

## 2020-09-03 DIAGNOSIS — L02619 Cutaneous abscess of unspecified foot: Secondary | ICD-10-CM | POA: Insufficient documentation

## 2020-09-03 NOTE — Progress Notes (Signed)
Awaiting platelets

## 2020-09-05 ENCOUNTER — Telehealth: Payer: Self-pay

## 2020-09-05 LAB — SPECIMEN STATUS REPORT

## 2020-09-05 LAB — PLATELET COUNT: Platelets: 52 10*3/uL — CL (ref 150–450)

## 2020-09-05 NOTE — Progress Notes (Signed)
Please advise pt her platelets are still on the low side. We still need to watch it. I will speak with Dr Manuella Ghazi ( neurology) to change her sz medicine

## 2020-09-05 NOTE — Telephone Encounter (Signed)
PT notified

## 2020-09-05 NOTE — Telephone Encounter (Signed)
-----   Message from Lavera Guise, MD sent at 09/05/2020  3:16 PM EDT ----- Please advise pt her platelets are still on the low side. We still need to watch it. I will speak with Dr Manuella Ghazi ( neurology) to change her sz medicine

## 2020-09-14 ENCOUNTER — Other Ambulatory Visit: Payer: Self-pay

## 2020-09-14 ENCOUNTER — Ambulatory Visit (INDEPENDENT_AMBULATORY_CARE_PROVIDER_SITE_OTHER): Payer: Managed Care, Other (non HMO) | Admitting: Internal Medicine

## 2020-09-14 ENCOUNTER — Encounter: Payer: Self-pay | Admitting: Internal Medicine

## 2020-09-14 DIAGNOSIS — R600 Localized edema: Secondary | ICD-10-CM | POA: Diagnosis not present

## 2020-09-14 DIAGNOSIS — M7989 Other specified soft tissue disorders: Secondary | ICD-10-CM

## 2020-09-14 DIAGNOSIS — R0600 Dyspnea, unspecified: Secondary | ICD-10-CM

## 2020-09-14 DIAGNOSIS — R0609 Other forms of dyspnea: Secondary | ICD-10-CM

## 2020-09-14 DIAGNOSIS — D696 Thrombocytopenia, unspecified: Secondary | ICD-10-CM

## 2020-09-14 MED ORDER — FUROSEMIDE 20 MG PO TABS: ORAL_TABLET | ORAL | 0 refills | Status: DC

## 2020-09-14 MED ORDER — CEPHALEXIN 500 MG PO CAPS: ORAL_CAPSULE | ORAL | 0 refills | Status: DC

## 2020-09-14 NOTE — Progress Notes (Signed)
[facility] [place], [place] [address]  Internal MEDICINE  Office Visit Note  Patient Name: Abigail Rice  [id number]  [id number]  Date of Service: 09/23/2020  Chief Complaint  Patient presents with  . Foot Swelling    left foot, got stung 3 weeks ago and went down then swelling came back    HPI Pt is here with C/O bilateral lower ext edema. She thinks its is coming from her heart since her mother and mother in law had the same condition. However there is some erythema of left leg/ankle   Recently I noticed her steady decline of platelets. Pt also had a sz last year and was seen by neurology, had CT head and EEG ( both negative for acute spike but microvascular disease). However her platelet have been low before start of keppra therapy. She admits o drink beer every day. She also has elevated transaminases, liver U/S done few years ago did show fatty liver. No well documented h/o cirrhosis of liver. Her diabetes is well controlled  No fever or chills and mild sob with chest pressure   Current Medication: Outpatient Encounter Medications as of 09/14/2020  Medication Sig  . amLODipine (NORVASC) 5 MG tablet Take 1 tablet (5 mg total) by mouth daily.  Marland Kitchen aspirin EC 81 MG tablet Take 81 mg by mouth daily.  . insulin lispro (HUMALOG KWIKPEN) 100 UNIT/ML KiwkPen Inject into the skin. Use at lunch with sliding scale may use up to 10 units   . levETIRAcetam (KEPPRA) 500 MG tablet Take 1 tablet (500 mg total) by mouth 2 (two) times daily.  Marland Kitchen levothyroxine (SYNTHROID) 50 MCG tablet Take 1 tablet (50 mcg total) by mouth daily before breakfast.  . losartan (COZAAR) 50 MG tablet Take 1 tablet (50 mg total) by mouth daily.  Glory Rosebush ULTRA test strip STRIPS FOR BLOOD SUGAR TESTING 1 TIME DAILY. DX 250.00  . POTASSIUM PO Take by mouth.  Marland Kitchen rOPINIRole (REQUIP) 0.25 MG tablet Take 1 to 2 tablets po QHS prn  . [DISCONTINUED] hyoscyamine (LEVSIN) 0.125 MG tablet Take 1 tablet  (0.125 mg total) by mouth every 6 (six) hours as needed for cramping.  . [DISCONTINUED] insulin lispro protamine-lispro (HUMALOG MIX 75/25) (75-25) 100 UNIT/ML SUSP injection Inject 20 Units into the skin 2 (two) times daily with a meal.  . [DISCONTINUED] Insulin Pen Needle (PEN NEEDLES) 32G X 4 MM MISC 1 each by Does not apply route daily.  . cephALEXin (KEFLEX) 500 MG capsule Take one tab po bid x 5 days  . [DISCONTINUED] doxycycline (VIBRA-TABS) 100 MG tablet Take 1 tablet (100 mg total) by mouth 2 (two) times daily. (Patient not taking: Reported on 09/14/2020)  . [DISCONTINUED] furosemide (LASIX) 20 MG tablet Take one tab for 3 days for swelling and then as needed   No facility-administered encounter medications on file as of 09/14/2020.    Surgical History: Past Surgical History:  Procedure Laterality Date  . CESAREAN SECTION    . COLONOSCOPY WITH PROPOFOL N/A 03/29/2016   Procedure: COLONOSCOPY WITH PROPOFOL;  Surgeon: Josefine Class, MD;  Location: [facility] ENDOSCOPY;  Service: Endoscopy;  Laterality: N/A;  . TUBAL LIGATION      Medical History: Past Medical History:  Diagnosis Date  . Bipolar disorder ([place])   . Depression   . Diabetes mellitus without complication ([place])   . Hyperlipidemia   . Hypertension     Family History: Family History  Problem Relation Age of Onset  .  Diabetes Mother   . Hypertension Mother     Social History   Socioeconomic History  . Marital status: Married    Spouse name: Not on file  . Number of children: Not on file  . Years of education: Not on file  . Highest education level: Not on file  Occupational History  . Not on file  Tobacco Use  . Smoking status: Current Every Day Smoker    Types: Cigarettes  . Smokeless tobacco: Never Used  Substance and Sexual Activity  . Alcohol use: Yes    Alcohol/week: 3.0 standard drinks    Types: 3 Cans of beer per week    Comment: daily   . Drug use: No  . Sexual activity: Not on file  Other  Topics Concern  . Not on file  Social History Narrative  . Not on file   Social Determinants of Health   Financial Resource Strain:   . Difficulty of Paying Living Expenses: Not on file  Food Insecurity:   . Worried About Charity fundraiser in the Last Year: Not on file  . Ran Out of Food in the Last Year: Not on file  Transportation Needs:   . Lack of Transportation (Medical): Not on file  . Lack of Transportation (Non-Medical): Not on file  Physical Activity:   . Days of Exercise per Week: Not on file  . Minutes of Exercise per Session: Not on file  Stress:   . Feeling of Stress : Not on file  Social Connections:   . Frequency of Communication with Friends and Family: Not on file  . Frequency of Social Gatherings with Friends and Family: Not on file  . Attends Religious Services: Not on file  . Active Member of Clubs or Organizations: Not on file  . Attends Archivist Meetings: Not on file  . Marital Status: Not on file  Intimate Partner Violence:   . Fear of Current or Ex-Partner: Not on file  . Emotionally Abused: Not on file  . Physically Abused: Not on file  . Sexually Abused: Not on file      Review of Systems  Constitutional: Negative for chills, diaphoresis and fatigue.  HENT: Negative for ear pain, postnasal drip and sinus pressure.   Eyes: Negative for photophobia, discharge, redness, itching and visual disturbance.  Respiratory: Positive for shortness of breath. Negative for cough and wheezing.   Cardiovascular: Positive for leg swelling. Negative for chest pain and palpitations.  Gastrointestinal: Negative for abdominal pain, constipation, diarrhea, nausea and vomiting.  Genitourinary: Negative for dysuria and flank pain.  Musculoskeletal: Negative for arthralgias, back pain, gait problem and neck pain.  Skin: Negative for color change.  Allergic/Immunologic: Negative for environmental allergies and food allergies.  Neurological: Negative for  dizziness and headaches.  Hematological: Does not bruise/bleed easily.  Psychiatric/Behavioral: Negative for agitation, behavioral problems (depression) and hallucinations.    Vital Signs: BP 140/84   Pulse 96   Temp (!) 97.1 F (36.2 C)   Resp 16   Ht 5\' 5"  (1.651 m)   Wt 179 lb 12.8 oz (81.6 kg)   LMP 11/07/2015 (Approximate)   SpO2 99%   BMI 29.92 kg/m    Physical Exam Constitutional:      General: She is not in acute distress.    Appearance: She is well-developed. She is not diaphoretic.  HENT:     Head: Normocephalic and atraumatic.     Mouth/Throat:     Pharynx: No oropharyngeal exudate.  Eyes:     Pupils: Pupils are equal, round, and reactive to light.  Neck:     Thyroid: No thyromegaly.     Vascular: No JVD.     Trachea: No tracheal deviation.  Cardiovascular:     Rate and Rhythm: Normal rate and regular rhythm.     Heart sounds: Normal heart sounds. No murmur heard.  No friction rub. No gallop.   Pulmonary:     Effort: Pulmonary effort is normal. No respiratory distress.     Breath sounds: No wheezing or rales.  Chest:     Chest wall: No tenderness.  Abdominal:     General: Bowel sounds are normal.     Palpations: Abdomen is soft.  Musculoskeletal:     Cervical back: Normal range of motion and neck supple.     Right lower leg: Edema present.     Left lower leg: Edema present.  Lymphadenopathy:     Cervical: No cervical adenopathy.  Skin:    General: Skin is warm and dry.  Neurological:     Mental Status: She is alert and oriented to person, place, and time.     Cranial Nerves: No cranial nerve deficit.  Psychiatric:        Behavior: Behavior normal.        Thought Content: Thought content normal.        Judgment: Judgment normal.    Assessment/Plan: 1. Dyspnea on exertion Pt is a long term diabetic, can have microvessel disease, will get echo to assess her LV function - EKG 12-Lead - ECHOCARDIOGRAM COMPLETE; Future  2. Localized edema Will  start low dose Lasix for now until other diagnostis - EKG 12-Lead - POCT ABI Screening Pilot No Charge - cephALEXin (KEFLEX) 500 MG capsule; Take one tab po bid x 5 days  Dispense: 10 capsule; Refill: 0  3. Thrombocytopenia ([place]) She has declining levels for last few years. With her low platelets, elevated transaminases, one episode of sz I suspect all of this related to her ongoing alcohol abuse. She might be developing Cirrhosis of he liver as well. Will need to further look into this   General Counseling: Ajaya verbalizes understanding of the findings of todays visit and agrees with plan of treatment. I have discussed any further diagnostic evaluation that may be needed or ordered today. We also reviewed her medications today. she has been encouraged to call the office with any questions or concerns that should arise related to todays visit.  Orders Placed This Encounter  Procedures  . EKG 12-Lead  . ECHOCARDIOGRAM COMPLETE  . POCT ABI Screening Pilot No Charge    Meds ordered this encounter  Medications  . cephALEXin (KEFLEX) 500 MG capsule    Sig: Take one tab po bid x 5 days    Dispense:  10 capsule    Refill:  0  . DISCONTD: furosemide (LASIX) 20 MG tablet    Sig: Take one tab for 3 days for swelling and then as needed    Dispense:  15 tablet    Refill:  0    Total time spent:45 Minutes Time spent includes review of chart, medications, test results, and follow up plan with the patient.      Dr Lavera Guise Internal medicine

## 2020-09-15 ENCOUNTER — Other Ambulatory Visit: Payer: Self-pay

## 2020-09-15 DIAGNOSIS — E1165 Type 2 diabetes mellitus with hyperglycemia: Secondary | ICD-10-CM

## 2020-09-15 DIAGNOSIS — K580 Irritable bowel syndrome with diarrhea: Secondary | ICD-10-CM

## 2020-09-15 MED ORDER — HUMALOG MIX 75/25 (75-25) 100 UNIT/ML ~~LOC~~ SUSP: 20.0000 [IU] | Freq: Two times a day (BID) | SUBCUTANEOUS | 3 refills | Status: DC

## 2020-09-15 MED ORDER — HYOSCYAMINE SULFATE 0.125 MG PO TABS: 0.1250 mg | ORAL_TABLET | Freq: Four times a day (QID) | ORAL | 2 refills | Status: DC | PRN

## 2020-09-20 ENCOUNTER — Other Ambulatory Visit: Payer: Self-pay

## 2020-09-22 ENCOUNTER — Other Ambulatory Visit: Payer: Self-pay

## 2020-09-22 ENCOUNTER — Other Ambulatory Visit: Payer: Self-pay | Admitting: Internal Medicine

## 2020-09-22 ENCOUNTER — Telehealth: Payer: Self-pay

## 2020-09-22 MED ORDER — PEN NEEDLES 32G X 4 MM MISC: 1.0000 | Freq: Every day | 3 refills | Status: DC

## 2020-09-23 ENCOUNTER — Encounter: Payer: Self-pay | Admitting: Internal Medicine

## 2020-09-27 ENCOUNTER — Ambulatory Visit: Payer: Managed Care, Other (non HMO)

## 2020-09-27 DIAGNOSIS — R0609 Other forms of dyspnea: Secondary | ICD-10-CM

## 2020-09-27 DIAGNOSIS — R0602 Shortness of breath: Secondary | ICD-10-CM | POA: Diagnosis not present

## 2020-09-27 DIAGNOSIS — R0600 Dyspnea, unspecified: Secondary | ICD-10-CM

## 2020-09-27 NOTE — Telephone Encounter (Signed)
She does not need one

## 2020-09-28 ENCOUNTER — Other Ambulatory Visit: Payer: Self-pay

## 2020-09-28 ENCOUNTER — Telehealth: Payer: Self-pay

## 2020-09-28 DIAGNOSIS — E039 Hypothyroidism, unspecified: Secondary | ICD-10-CM

## 2020-09-28 MED ORDER — LEVOTHYROXINE SODIUM 50 MCG PO TABS: 50.0000 ug | ORAL_TABLET | Freq: Every day | ORAL | 1 refills | Status: DC

## 2020-09-28 NOTE — Telephone Encounter (Signed)
Called and left VM informing pt that she did not need any labs done before her next office visit per DFK.  dbs

## 2020-09-30 ENCOUNTER — Other Ambulatory Visit: Payer: Self-pay | Admitting: Internal Medicine

## 2020-10-09 ENCOUNTER — Other Ambulatory Visit: Payer: Self-pay

## 2020-10-09 DIAGNOSIS — R569 Unspecified convulsions: Secondary | ICD-10-CM

## 2020-10-09 MED ORDER — LEVETIRACETAM 500 MG PO TABS: 500.0000 mg | ORAL_TABLET | Freq: Two times a day (BID) | ORAL | 1 refills | Status: DC

## 2020-10-11 ENCOUNTER — Ambulatory Visit (INDEPENDENT_AMBULATORY_CARE_PROVIDER_SITE_OTHER): Payer: Managed Care, Other (non HMO) | Admitting: Hospice and Palliative Medicine

## 2020-10-11 ENCOUNTER — Encounter: Payer: Self-pay | Admitting: Hospice and Palliative Medicine

## 2020-10-11 ENCOUNTER — Encounter (INDEPENDENT_AMBULATORY_CARE_PROVIDER_SITE_OTHER): Payer: Self-pay

## 2020-10-11 ENCOUNTER — Other Ambulatory Visit: Payer: Self-pay

## 2020-10-11 DIAGNOSIS — D691 Qualitative platelet defects: Secondary | ICD-10-CM

## 2020-10-11 DIAGNOSIS — Z1231 Encounter for screening mammogram for malignant neoplasm of breast: Secondary | ICD-10-CM

## 2020-10-11 DIAGNOSIS — F1099 Alcohol use, unspecified with unspecified alcohol-induced disorder: Secondary | ICD-10-CM

## 2020-10-11 DIAGNOSIS — D696 Thrombocytopenia, unspecified: Secondary | ICD-10-CM

## 2020-10-11 DIAGNOSIS — R748 Abnormal levels of other serum enzymes: Secondary | ICD-10-CM

## 2020-10-11 NOTE — Progress Notes (Addendum)
[facility] [place], [place] [address]  Internal MEDICINE  Office Visit Note  Patient Name: Abigail Rice  [id number]  [id number]  Date of Service: 10/16/2020  Chief Complaint  Patient presents with  . Follow-up  . Depression  . Diabetes  . Hyperlipidemia  . Hypertension  . Quality Metric Gaps    [place]  . controlled substance form    reviewed with PT    HPI Patient is here for routine follow-up We have been completing a work-up for bilateral lower extremity edema Reviewed her echo-essentially normal echo, trace tricuspid regurgitation She was placed on furosemide for ankle swelling which has significantly improved Reviewed her labs-RBC as well as platelets are low--platelets continue to trend down Also discussed that her liver enzymes are elevated, low albumin Discussed alcohol intake, she reports that most nights she drinks about 3-4 beers, on some nights she will drink more about 6-7 and some nights she doesn't drink at all--she has been doing this for many years Discussed that her lab values possibly indicate complications related to her alcohol intake   Current Medication: Outpatient Encounter Medications as of 10/11/2020  Medication Sig  . amLODipine (NORVASC) 5 MG tablet Take 1 tablet (5 mg total) by mouth daily.  Marland Kitchen aspirin EC 81 MG tablet Take 81 mg by mouth daily.  . cephALEXin (KEFLEX) 500 MG capsule Take one tab po bid x 5 days  . hyoscyamine (LEVSIN) 0.125 MG tablet Take 1 tablet (0.125 mg total) by mouth every 6 (six) hours as needed for cramping.  . insulin lispro (HUMALOG KWIKPEN) 100 UNIT/ML KiwkPen Inject into the skin. Use at lunch with sliding scale may use up to 10 units [place]  . insulin lispro protamine-lispro (HUMALOG MIX 75/25) (75-25) 100 UNIT/ML SUSP injection Inject 20 Units into the skin 2 (two) times daily with a meal.  . Insulin Pen Needle (PEN NEEDLES) 32G X 4 MM MISC 1 each by Does not apply route daily.  Marland Kitchen  levETIRAcetam (KEPPRA) 500 MG tablet Take 1 tablet (500 mg total) by mouth 2 (two) times daily.  Marland Kitchen levothyroxine (SYNTHROID) 50 MCG tablet Take 1 tablet (50 mcg total) by mouth daily before breakfast.  . losartan (COZAAR) 50 MG tablet Take 1 tablet (50 mg total) by mouth daily.  Glory Rosebush ULTRA test strip STRIPS FOR BLOOD SUGAR TESTING 1 TIME DAILY. DX 250.00  . POTASSIUM PO Take by mouth.  Marland Kitchen rOPINIRole (REQUIP) 0.25 MG tablet Take 1 to 2 tablets po QHS prn  . [DISCONTINUED] furosemide (LASIX) 20 MG tablet TAKE 1 TABLET BY MOUTH EVERY DAY FOR 3 DAYS FOR SWELLING AND THEN AS NEEDED   No facility-administered encounter medications on file as of 10/11/2020.    Surgical History: Past Surgical History:  Procedure Laterality Date  . CESAREAN SECTION    . COLONOSCOPY WITH PROPOFOL N/A 03/29/2016   Procedure: COLONOSCOPY WITH PROPOFOL;  Surgeon: Josefine Class, MD;  Location: [facility] ENDOSCOPY;  Service: Endoscopy;  Laterality: N/A;  . TUBAL LIGATION      Medical History: Past Medical History:  Diagnosis Date  . Bipolar disorder ([place])   . Depression   . Diabetes mellitus without complication ([place])   . Hyperlipidemia   . Hypertension     Family History: Family History  Problem Relation Age of Onset  . Diabetes Mother   . Hypertension Mother     Social History   Socioeconomic History  . Marital status: Married    Spouse name: Not  on file  . Number of children: Not on file  . Years of education: Not on file  . Highest education level: Not on file  Occupational History  . Not on file  Tobacco Use  . Smoking status: Current Every Day Smoker    Types: Cigarettes  . Smokeless tobacco: Never Used  Substance and Sexual Activity  . Alcohol use: Yes    Alcohol/week: 3.0 standard drinks    Types: 3 Cans of beer per week    Comment: daily   . Drug use: No  . Sexual activity: Not on file  Other Topics Concern  . Not on file  Social History Narrative  . Not on file   Social  Determinants of Health   Financial Resource Strain:   . Difficulty of Paying Living Expenses: Not on file  Food Insecurity:   . Worried About Charity fundraiser in the Last Year: Not on file  . Ran Out of Food in the Last Year: Not on file  Transportation Needs:   . Lack of Transportation (Medical): Not on file  . Lack of Transportation (Non-Medical): Not on file  Physical Activity:   . Days of Exercise per Week: Not on file  . Minutes of Exercise per Session: Not on file  Stress:   . Feeling of Stress : Not on file  Social Connections:   . Frequency of Communication with Friends and Family: Not on file  . Frequency of Social Gatherings with Friends and Family: Not on file  . Attends Religious Services: Not on file  . Active Member of Clubs or Organizations: Not on file  . Attends Archivist Meetings: Not on file  . Marital Status: Not on file  Intimate Partner Violence:   . Fear of Current or Ex-Partner: Not on file  . Emotionally Abused: Not on file  . Physically Abused: Not on file  . Sexually Abused: Not on file      Review of Systems  Constitutional: Negative for chills, diaphoresis and fatigue.  HENT: Negative for ear pain, postnasal drip and sinus pressure.   Eyes: Negative for photophobia, discharge, redness, itching and visual disturbance.  Respiratory: Negative for cough, shortness of breath and wheezing.   Cardiovascular: Negative for chest pain, palpitations and leg swelling.  Gastrointestinal: Negative for abdominal pain, constipation, diarrhea, nausea and vomiting.  Genitourinary: Negative for dysuria and flank pain.  Musculoskeletal: Negative for arthralgias, back pain, gait problem and neck pain.  Skin: Negative for color change.  Allergic/Immunologic: Negative for environmental allergies and food allergies.  Neurological: Negative for dizziness and headaches.  Hematological: Does not bruise/bleed easily.  Psychiatric/Behavioral: Negative for  agitation, behavioral problems (depression) and hallucinations.    Vital Signs: BP 122/75   Pulse 91   Temp (!) 97.5 F (36.4 C)   Resp 16   Ht 5\' 5"  (1.651 m)   Wt 176 lb 12.8 oz (80.2 kg)   LMP 11/07/2015 (Approximate)   SpO2 100%   BMI 29.42 kg/m    Physical Exam Vitals reviewed.  Cardiovascular:     Rate and Rhythm: Normal rate and regular rhythm.     Pulses: Normal pulses.     Heart sounds: Normal heart sounds.  Pulmonary:     Effort: Pulmonary effort is normal.     Breath sounds: Normal breath sounds.  Skin:    General: Skin is warm.  Neurological:     General: No focal deficit present.     Mental Status: She  is alert and oriented to person, place, and time. Mental status is at baseline.  Psychiatric:        Mood and Affect: Mood normal.        Behavior: Behavior normal.        Thought Content: Thought content normal.    Assessment/Plan: 1. Elevated liver enzymes Will repeat labs, discussed if enzymes remain elevated our next step will be abdominal US/liver - Comprehensive Metabolic Panel (CMET)  2. Platelet dysfunction ([facility]) Monitoring platelet levels--will also need abdominal US to assess liver for underlying disease causing thrombocytopenia - CBC w/Diff/Platelet  3. Alcohol intake above recommended sensible limits with complication ([place]) In depth conversation the need to cut back on hr alcohol intake with goal of complete cessation--discussed her results potentially indicate complications related to excessive alcohol intake and it is of importance that she works on alcohol intake to avoid further complications  General Counseling: Aayana verbalizes understanding of the findings of todays visit and agrees with plan of treatment. I have discussed any further diagnostic evaluation that may be needed or ordered today. We also reviewed her medications today. she has been encouraged to call the office with any questions or concerns that should arise related to todays  visit.    Orders Placed This Encounter  Procedures  . Comprehensive Metabolic Panel (CMET)  . CBC w/Diff/Platelet      Time spent: 30 Minutes Time spent includes review of chart, medications, test results and follow-up plan with the patient.  This patient was seen by Theodoro Grist AGNP-C in Collaboration with Dr Lavera Guise as a part of collaborative care agreement     Tanna Furry. Adaley Kiene AGNP-C Internal medicine

## 2020-10-13 ENCOUNTER — Other Ambulatory Visit: Payer: Self-pay | Admitting: Internal Medicine

## 2020-10-14 ENCOUNTER — Encounter: Payer: Self-pay | Admitting: Hospice and Palliative Medicine

## 2020-10-16 NOTE — Addendum Note (Signed)
Addended by: Lavera Guise on: 10/16/2020 03:54 PM   Modules accepted: Orders

## 2020-10-17 ENCOUNTER — Other Ambulatory Visit: Payer: Self-pay

## 2020-10-17 DIAGNOSIS — E1165 Type 2 diabetes mellitus with hyperglycemia: Secondary | ICD-10-CM

## 2020-10-17 MED ORDER — HUMALOG MIX 75/25 (75-25) 100 UNIT/ML ~~LOC~~ SUSP: 20.0000 [IU] | Freq: Two times a day (BID) | SUBCUTANEOUS | 3 refills | Status: DC

## 2020-10-19 ENCOUNTER — Other Ambulatory Visit: Payer: Self-pay

## 2020-10-19 DIAGNOSIS — E1165 Type 2 diabetes mellitus with hyperglycemia: Secondary | ICD-10-CM

## 2020-10-19 MED ORDER — HUMALOG MIX 75/25 (75-25) 100 UNIT/ML ~~LOC~~ SUSP: 20.0000 [IU] | Freq: Two times a day (BID) | SUBCUTANEOUS | 3 refills | Status: DC

## 2020-10-20 ENCOUNTER — Telehealth: Payer: Self-pay

## 2020-10-20 ENCOUNTER — Other Ambulatory Visit: Payer: Self-pay

## 2020-10-20 DIAGNOSIS — I10 Essential (primary) hypertension: Secondary | ICD-10-CM

## 2020-10-20 MED ORDER — LOSARTAN POTASSIUM 50 MG PO TABS: 50.0000 mg | ORAL_TABLET | Freq: Every day | ORAL | 3 refills | Status: DC

## 2020-10-20 NOTE — Telephone Encounter (Signed)
Try to call pt  No voicemail setup that lab already in computer she can go any [facility] to been done

## 2020-10-25 ENCOUNTER — Ambulatory Visit: Payer: Managed Care, Other (non HMO) | Admitting: Internal Medicine

## 2020-10-25 ENCOUNTER — Other Ambulatory Visit: Payer: Self-pay

## 2020-10-25 MED ORDER — INSULIN LISPRO PROT & LISPRO (75-25 MIX) 100 UNIT/ML KWIKPEN: PEN_INJECTOR | SUBCUTANEOUS | 11 refills | Status: DC

## 2020-10-29 ENCOUNTER — Other Ambulatory Visit: Payer: Self-pay | Admitting: Internal Medicine

## 2020-11-02 LAB — CBC WITH DIFFERENTIAL/PLATELET
Basophils Absolute: 0 10*3/uL (ref 0.0–0.2)
Basos: 0 %
EOS (ABSOLUTE): 0.1 10*3/uL (ref 0.0–0.4)
Eos: 1 %
Hematocrit: 33.5 % — ABNORMAL LOW (ref 34.0–46.6)
Hemoglobin: 11.5 g/dL (ref 11.1–15.9)
Immature Grans (Abs): 0 10*3/uL (ref 0.0–0.1)
Immature Granulocytes: 0 %
Lymphocytes Absolute: 2.2 10*3/uL (ref 0.7–3.1)
Lymphs: 44 %
MCH: 32.4 pg (ref 26.6–33.0)
MCHC: 34.3 g/dL (ref 31.5–35.7)
MCV: 94 fL (ref 79–97)
Monocytes Absolute: 0.5 10*3/uL (ref 0.1–0.9)
Monocytes: 9 %
Neutrophils Absolute: 2.2 10*3/uL (ref 1.4–7.0)
Neutrophils: 46 %
Platelets: 61 10*3/uL — CL (ref 150–450)
RBC: 3.55 x10E6/uL — ABNORMAL LOW (ref 3.77–5.28)
RDW: 11.7 % (ref 11.7–15.4)
WBC: 4.9 10*3/uL (ref 3.4–10.8)

## 2020-11-02 LAB — COMPREHENSIVE METABOLIC PANEL
ALT: 57 IU/L — ABNORMAL HIGH (ref 0–32)
AST: 61 IU/L — ABNORMAL HIGH (ref 0–40)
Albumin/Globulin Ratio: 0.9 — ABNORMAL LOW (ref 1.2–2.2)
Albumin: 3.5 g/dL — ABNORMAL LOW (ref 3.8–4.9)
Alkaline Phosphatase: 287 IU/L — ABNORMAL HIGH (ref 44–121)
BUN/Creatinine Ratio: 16 (ref 9–23)
BUN: 18 mg/dL (ref 6–24)
Bilirubin Total: 0.5 mg/dL (ref 0.0–1.2)
CO2: 22 mmol/L (ref 20–29)
Calcium: 8.8 mg/dL (ref 8.7–10.2)
Chloride: 102 mmol/L (ref 96–106)
Creatinine, Ser: 1.12 mg/dL — ABNORMAL HIGH (ref 0.57–1.00)
GFR calc Af Amer: 64 mL/min/{1.73_m2} (ref >59)
GFR calc non Af Amer: 56 mL/min/{1.73_m2} — ABNORMAL LOW (ref >59)
Globulin, Total: 3.9 g/dL (ref 1.5–4.5)
Glucose: 174 mg/dL — ABNORMAL HIGH (ref 65–99)
Potassium: 4.3 mmol/L (ref 3.5–5.2)
Sodium: 136 mmol/L (ref 134–144)
Total Protein: 7.4 g/dL (ref 6.0–8.5)

## 2020-11-02 NOTE — Progress Notes (Signed)
Labs reviewed, will need further work-up, will discuss at next appt.

## 2020-11-08 ENCOUNTER — Ambulatory Visit (INDEPENDENT_AMBULATORY_CARE_PROVIDER_SITE_OTHER): Payer: Managed Care, Other (non HMO) | Admitting: Hospice and Palliative Medicine

## 2020-11-08 ENCOUNTER — Other Ambulatory Visit: Payer: Self-pay

## 2020-11-08 ENCOUNTER — Encounter: Payer: Self-pay | Admitting: Hospice and Palliative Medicine

## 2020-11-08 DIAGNOSIS — R748 Abnormal levels of other serum enzymes: Secondary | ICD-10-CM | POA: Diagnosis not present

## 2020-11-08 DIAGNOSIS — D696 Thrombocytopenia, unspecified: Secondary | ICD-10-CM | POA: Diagnosis not present

## 2020-11-08 DIAGNOSIS — I10 Essential (primary) hypertension: Secondary | ICD-10-CM | POA: Diagnosis not present

## 2020-11-08 NOTE — Progress Notes (Signed)
[facility] [place], [place] [address]  Internal MEDICINE  Office Visit Note  Patient Name: Abigail Rice  [id number]  [id number]  Date of Service: 11/12/2020  Chief Complaint  Patient presents with  . Follow-up  . Depression  . Diabetes  . Hyperlipidemia  . Hypertension  . policy update form    received  . Quality Metric Gaps    eye exam    HPI Patient is here for routine follow-up Discussed her recent labs that were repeated-she does report that she has cut back on her drinking since initial labs, liver enzymes remain elevated, platelets remain low Concerned today about finances, recommend liver US based on results, she is not sure she will be able to afford this as she is still paying on her balance from echocardiogram She is overwhelmed as the holidays are also upcoming and that will cause further financial strain Discussed the importance of further work-up due to abnormal labs-she was hoping her labs would be normal since she has cut back on her drinking She does continue to drink most days of the week but has cut back the amount she is drinking  Discussed her echocardiogram results-essentially normal, trace tricuspid regurgitation  Current Medication: Outpatient Encounter Medications as of 11/08/2020  Medication Sig  . amLODipine (NORVASC) 5 MG tablet Take 1 tablet (5 mg total) by mouth daily.  Marland Kitchen aspirin EC 81 MG tablet Take 81 mg by mouth daily.  . furosemide (LASIX) 20 MG tablet TAKE 1 TABLET BY MOUTH EVERY DAY FOR 3 DAYS FOR SWELLING AND THEN AS NEEDED  . hyoscyamine (LEVSIN) 0.125 MG tablet Take 1 tablet (0.125 mg total) by mouth every 6 (six) hours as needed for cramping.  . insulin lispro (HUMALOG KWIKPEN) 100 UNIT/ML KiwkPen Inject into the skin. Use at lunch with sliding scale may use up to 10 units [place]  . Insulin Lispro Prot & Lispro (HUMALOG 75/25 MIX) (75-25) 100 UNIT/ML Kwikpen Inject 20 Units into the skin 2 (two) times daily with a  meal.  . Insulin Pen Needle (PEN NEEDLES) 32G X 4 MM MISC 1 each by Does not apply route daily.  Marland Kitchen levETIRAcetam (KEPPRA) 500 MG tablet Take 1 tablet (500 mg total) by mouth 2 (two) times daily. (Patient taking differently: Take 500 mg by mouth daily. )  . levothyroxine (SYNTHROID) 50 MCG tablet Take 1 tablet (50 mcg total) by mouth daily before breakfast.  . losartan (COZAAR) 50 MG tablet Take 1 tablet (50 mg total) by mouth daily.  Glory Rosebush ULTRA test strip STRIPS FOR BLOOD SUGAR TESTING 1 TIME DAILY. DX 250.00  . POTASSIUM PO Take by mouth.  Marland Kitchen rOPINIRole (REQUIP) 0.25 MG tablet Take 1 to 2 tablets po QHS prn  . [DISCONTINUED] cephALEXin (KEFLEX) 500 MG capsule Take one tab po bid x 5 days (Patient not taking: Reported on 11/08/2020)   No facility-administered encounter medications on file as of 11/08/2020.    Surgical History: Past Surgical History:  Procedure Laterality Date  . CESAREAN SECTION    . COLONOSCOPY WITH PROPOFOL N/A 03/29/2016   Procedure: COLONOSCOPY WITH PROPOFOL;  Surgeon: Josefine Class, MD;  Location: [facility] ENDOSCOPY;  Service: Endoscopy;  Laterality: N/A;  . TUBAL LIGATION      Medical History: Past Medical History:  Diagnosis Date  . Bipolar disorder ([place])   . Depression   . Diabetes mellitus without complication ([place])   . Hyperlipidemia   . Hypertension     Family History:  Family History  Problem Relation Age of Onset  . Diabetes Mother   . Hypertension Mother     Social History   Socioeconomic History  . Marital status: Married    Spouse name: Not on file  . Number of children: Not on file  . Years of education: Not on file  . Highest education level: Not on file  Occupational History  . Not on file  Tobacco Use  . Smoking status: Current Every Day Smoker    Types: Cigarettes  . Smokeless tobacco: Never Used  Substance and Sexual Activity  . Alcohol use: Yes    Alcohol/week: 3.0 standard drinks    Types: 3 Cans of beer per week     Comment: daily   . Drug use: No  . Sexual activity: Not on file  Other Topics Concern  . Not on file  Social History Narrative  . Not on file   Social Determinants of Health   Financial Resource Strain:   . Difficulty of Paying Living Expenses: Not on file  Food Insecurity:   . Worried About Charity fundraiser in the Last Year: Not on file  . Ran Out of Food in the Last Year: Not on file  Transportation Needs:   . Lack of Transportation (Medical): Not on file  . Lack of Transportation (Non-Medical): Not on file  Physical Activity:   . Days of Exercise per Week: Not on file  . Minutes of Exercise per Session: Not on file  Stress:   . Feeling of Stress : Not on file  Social Connections:   . Frequency of Communication with Friends and Family: Not on file  . Frequency of Social Gatherings with Friends and Family: Not on file  . Attends Religious Services: Not on file  . Active Member of Clubs or Organizations: Not on file  . Attends Archivist Meetings: Not on file  . Marital Status: Not on file  Intimate Partner Violence:   . Fear of Current or Ex-Partner: Not on file  . Emotionally Abused: Not on file  . Physically Abused: Not on file  . Sexually Abused: Not on file      Review of Systems  Constitutional: Negative for chills, diaphoresis and fatigue.  HENT: Negative for ear pain, postnasal drip and sinus pressure.   Eyes: Negative for photophobia, discharge, redness, itching and visual disturbance.  Respiratory: Negative for cough, shortness of breath and wheezing.   Cardiovascular: Negative for chest pain, palpitations and leg swelling.  Gastrointestinal: Negative for abdominal pain, constipation, diarrhea, nausea and vomiting.  Genitourinary: Negative for dysuria and flank pain.  Musculoskeletal: Negative for arthralgias, back pain, gait problem and neck pain.  Skin: Negative for color change.  Allergic/Immunologic: Negative for environmental allergies and  food allergies.  Neurological: Negative for dizziness and headaches.  Hematological: Does not bruise/bleed easily.  Psychiatric/Behavioral: Negative for agitation, behavioral problems (depression) and hallucinations.    Vital Signs: BP 122/72   Pulse 88   Temp (!) 97.5 F (36.4 C)   Resp 16   Ht 5\' 5"  (1.651 m)   Wt 176 lb 12.8 oz (80.2 kg)   LMP 11/07/2015 (Approximate)   SpO2 99%   BMI 29.42 kg/m    Physical Exam Vitals reviewed.  Constitutional:      Appearance: Normal appearance.  Cardiovascular:     Rate and Rhythm: Normal rate and regular rhythm.     Pulses: Normal pulses.     Heart sounds: Normal heart  sounds.  Pulmonary:     Effort: Pulmonary effort is normal.     Breath sounds: Normal breath sounds.  Abdominal:     General: Abdomen is flat.     Palpations: Abdomen is soft.  Musculoskeletal:        General: Normal range of motion.     Cervical back: Normal range of motion.  Skin:    General: Skin is warm.  Neurological:     General: No focal deficit present.     Mental Status: She is alert and oriented to person, place, and time. Mental status is at baseline.  Psychiatric:        Mood and Affect: Mood normal.        Behavior: Behavior normal.        Thought Content: Thought content normal.        Judgment: Judgment normal.    Assessment/Plan: 1. Elevated liver enzymes Remains elevated and area of concern due to her alcohol intake, will review [place] results for possible liver disease Discussed talking with her insurance company about financial concerns Advised to continue working on cutting back her alcohol intake - US Abdomen Limited RUQ (LIVER/GB); Future  2. Thrombocytopenia ([place]) Likely secondary to heavy alcohol intake--will review [place] results and adjust plan of care as needed - US Abdomen Limited RUQ (LIVER/GB); Future  3. Essential hypertension BP and HR remain well controlled today--will continue with routine monitoring  General Counseling:  Zohar verbalizes understanding of the findings of todays visit and agrees with plan of treatment. I have discussed any further diagnostic evaluation that may be needed or ordered today. We also reviewed her medications today. she has been encouraged to call the office with any questions or concerns that should arise related to todays visit.    Orders Placed This Encounter  Procedures  . US Abdomen Limited RUQ (LIVER/GB)      Time spent:30 Minutes Time spent includes review of chart, medications, test results and follow-up plan with the patient.  This patient was seen by Theodoro Grist AGNP-C in Collaboration with Dr Lavera Guise as a part of collaborative care agreement     Tanna Furry. Dinara Lupu AGNP-C Internal medicine

## 2020-11-12 ENCOUNTER — Encounter: Payer: Self-pay | Admitting: Hospice and Palliative Medicine

## 2020-11-15 ENCOUNTER — Other Ambulatory Visit: Payer: Self-pay | Admitting: Internal Medicine

## 2020-11-15 ENCOUNTER — Telehealth: Payer: Self-pay

## 2020-11-15 NOTE — Telephone Encounter (Signed)
Tried calling pt and unable to leave a message to explain to her ultrasound benefits and to reschd time of ultrasound. Abigail Rice

## 2020-11-23 ENCOUNTER — Encounter: Payer: Self-pay | Admitting: Nurse Practitioner

## 2020-11-23 ENCOUNTER — Other Ambulatory Visit: Payer: Self-pay

## 2020-11-23 ENCOUNTER — Ambulatory Visit: Payer: Managed Care, Other (non HMO) | Admitting: Nurse Practitioner

## 2020-11-23 VITALS — BP 138/76 | HR 69 | Temp 97.7°F | Resp 16 | Ht 65.0 in | Wt 179.0 lb

## 2020-11-23 DIAGNOSIS — Z794 Long term (current) use of insulin: Secondary | ICD-10-CM | POA: Diagnosis not present

## 2020-11-23 DIAGNOSIS — R569 Unspecified convulsions: Secondary | ICD-10-CM

## 2020-11-23 DIAGNOSIS — E119 Type 2 diabetes mellitus without complications: Secondary | ICD-10-CM | POA: Diagnosis not present

## 2020-11-23 DIAGNOSIS — D696 Thrombocytopenia, unspecified: Secondary | ICD-10-CM

## 2020-11-23 DIAGNOSIS — I10 Essential (primary) hypertension: Secondary | ICD-10-CM | POA: Diagnosis not present

## 2020-11-23 LAB — POCT GLYCOSYLATED HEMOGLOBIN (HGB A1C): Hemoglobin A1C: 5.4 % (ref 4.0–5.6)

## 2020-11-23 NOTE — Progress Notes (Signed)
[facility] [place], [place] [address]  Internal MEDICINE  Office Visit Note  Patient Name: Abigail Rice  [id number]  [id number]  Date of Service: 12/24/2020  Chief Complaint  Patient presents with  . Follow-up  . Depression  . Diabetes  . Hypertension  . Hyperlipidemia  . controlled substance form    reviewed with PT    The patient is here for follow up visit. She states that her blood sugars are doing well. Her HgbA1c is 5.4 today. Blood pressure is well managed. She did have repeat of her cmp and cbc. Liver functions are still elevated, but stable. Platelet count has improved some, though still very low. Her renal functions are also elevated. She is scheduled to have RUQ ultrasound later this month. She is slowly weaning off keppra which may be causing lab abnormalities.  She continues to be seizure free. She denies abdominal pain or tenderness. She denies nausea, vomiting, or other abdominal symptoms. She states that she is feeling well. She denies concerns or complaints.       Current Medication: Outpatient Encounter Medications as of 11/23/2020  Medication Sig  . amLODipine (NORVASC) 5 MG tablet Take 1 tablet (5 mg total) by mouth daily.  Marland Kitchen aspirin EC 81 MG tablet Take 81 mg by mouth daily.  . hyoscyamine (LEVSIN) 0.125 MG tablet Take 1 tablet (0.125 mg total) by mouth every 6 (six) hours as needed for cramping.  . insulin lispro (HUMALOG KWIKPEN) 100 UNIT/ML KiwkPen Inject into the skin. Use at lunch with sliding scale may use up to 10 units [place]  . Insulin Lispro Prot & Lispro (HUMALOG 75/25 MIX) (75-25) 100 UNIT/ML Kwikpen Inject 20 Units into the skin 2 (two) times daily with a meal.  . Insulin Pen Needle (PEN NEEDLES) 32G X 4 MM MISC 1 each by Does not apply route daily.  Marland Kitchen levETIRAcetam (KEPPRA) 500 MG tablet Take 1 tablet (500 mg total) by mouth 2 (two) times daily. (Patient taking differently: Take 500 mg by mouth daily. )  . levothyroxine  (SYNTHROID) 50 MCG tablet Take 1 tablet (50 mcg total) by mouth daily before breakfast.  . losartan (COZAAR) 50 MG tablet Take 1 tablet (50 mg total) by mouth daily.  Glory Rosebush ULTRA test strip STRIPS FOR BLOOD SUGAR TESTING 1 TIME DAILY. DX 250.00  . POTASSIUM PO Take by mouth.  Marland Kitchen rOPINIRole (REQUIP) 0.25 MG tablet Take 1 to 2 tablets po QHS prn  . [DISCONTINUED] furosemide (LASIX) 20 MG tablet TAKE 1 TABLET BY MOUTH EVERY DAY FOR 3 DAYS FOR SWELLING AND THEN AS NEEDED   No facility-administered encounter medications on file as of 11/23/2020.    Surgical History: Past Surgical History:  Procedure Laterality Date  . CESAREAN SECTION    . COLONOSCOPY WITH PROPOFOL N/A 03/29/2016   Procedure: COLONOSCOPY WITH PROPOFOL;  Surgeon: Josefine Class, MD;  Location: [facility] ENDOSCOPY;  Service: Endoscopy;  Laterality: N/A;  . TUBAL LIGATION      Medical History: Past Medical History:  Diagnosis Date  . Bipolar disorder ([place])   . Depression   . Diabetes mellitus without complication ([place])   . Hyperlipidemia   . Hypertension     Family History: Family History  Problem Relation Age of Onset  . Diabetes Mother   . Hypertension Mother     Social History   Socioeconomic History  . Marital status: Married    Spouse name: Not on file  . Number of children:  Not on file  . Years of education: Not on file  . Highest education level: Not on file  Occupational History  . Not on file  Tobacco Use  . Smoking status: Current Every Day Smoker    Types: Cigarettes  . Smokeless tobacco: Never Used  Substance and Sexual Activity  . Alcohol use: Yes    Alcohol/week: 3.0 standard drinks    Types: 3 Cans of beer per week    Comment: daily   . Drug use: No  . Sexual activity: Not on file  Other Topics Concern  . Not on file  Social History Narrative  . Not on file   Social Determinants of Health   Financial Resource Strain: Not on file  Food Insecurity: Not on file  Transportation  Needs: Not on file  Physical Activity: Not on file  Stress: Not on file  Social Connections: Not on file  Intimate Partner Violence: Not on file      Review of Systems  Constitutional: Negative for activity change, chills, fatigue and unexpected weight change.  HENT: Negative for congestion, postnasal drip, rhinorrhea, sneezing and sore throat.   Respiratory: Negative for cough, chest tightness, shortness of breath and wheezing.   Cardiovascular: Negative for chest pain and palpitations.  Gastrointestinal: Negative for abdominal distention, abdominal pain, constipation, diarrhea, nausea and vomiting.  Endocrine: Negative for cold intolerance, heat intolerance, polydipsia and polyuria.       Blood sugars doing well   Musculoskeletal: Negative for arthralgias, back pain, joint swelling and neck pain.  Skin: Negative for rash.  Allergic/Immunologic: Negative for environmental allergies.  Neurological: Negative for dizziness, tremors, numbness and headaches.  Hematological: Negative for adenopathy. Does not bruise/bleed easily.  Psychiatric/Behavioral: Negative for behavioral problems (Depression), sleep disturbance and suicidal ideas. The patient is not nervous/anxious.     Today's Vitals   11/23/20 0917  BP: 138/76  Pulse: 69  Resp: 16  Temp: 97.7 F (36.5 C)  SpO2: 99%  Weight: 179 lb (81.2 kg)  Height: 5\' 5"  (1.651 m)   Body mass index is 29.79 kg/m.  Physical Exam Vitals and nursing note reviewed.  Constitutional:      General: She is not in acute distress.    Appearance: Normal appearance. She is well-developed and well-nourished. She is not diaphoretic.  HENT:     Head: Normocephalic and atraumatic.     Mouth/Throat:     Mouth: Oropharynx is clear and moist.     Pharynx: No oropharyngeal exudate.  Eyes:     Extraocular Movements: EOM normal.     Pupils: Pupils are equal, round, and reactive to light.  Neck:     Thyroid: No thyromegaly.     Vascular: No JVD.      Trachea: No tracheal deviation.  Cardiovascular:     Rate and Rhythm: Normal rate and regular rhythm.     Heart sounds: Normal heart sounds. No murmur heard. No friction rub. No gallop.   Pulmonary:     Effort: Pulmonary effort is normal. No respiratory distress.     Breath sounds: Normal breath sounds. No wheezing or rales.  Chest:     Chest wall: No tenderness.  Abdominal:     General: Bowel sounds are normal.     Palpations: Abdomen is soft.     Tenderness: There is no abdominal tenderness.  Musculoskeletal:        General: Normal range of motion.     Cervical back: Normal range of motion and neck  supple.  Lymphadenopathy:     Cervical: No cervical adenopathy.  Skin:    General: Skin is warm and dry.     Capillary Refill: Capillary refill takes less than 2 seconds.  Neurological:     General: No focal deficit present.     Mental Status: She is alert and oriented to person, place, and time.     Cranial Nerves: No cranial nerve deficit.  Psychiatric:        Mood and Affect: Mood and affect and mood normal.        Behavior: Behavior normal.        Thought Content: Thought content normal.        Judgment: Judgment normal.    Assessment/Plan: 1. Type 2 diabetes mellitus without complication, with long-term current use of insulin (HCC) - POCT HgB A1C 5.4 today. Continue diabetic medication as prescribed. Monitor closely.   2. Thrombocytopenia (HCC) Platelet count improved though still elevated. Patient scheduled for RUQ ultrasound later this month. Referral as indicated.   3. Essential hypertension Stable. Continue bp medication as prescribed   4. Seizures ([place]) Stable with weaning of keppra. Will monitor closely.   General Counseling: Monae verbalizes understanding of the findings of todays visit and agrees with plan of treatment. I have discussed any further diagnostic evaluation that may be needed or ordered today. We also reviewed her medications today. she has been  encouraged to call the office with any questions or concerns that should arise related to todays visit.  This patient was seen by Leretha Pol FNP Collaboration with Dr Lavera Guise as a part of collaborative care agreement  Orders Placed This Encounter  Procedures  . POCT HgB A1C      Total time spent: 30 Minutes   Time spent includes review of chart, medications, test results, and follow up plan with the patient.      Dr Lavera Guise Internal medicine

## 2020-11-29 ENCOUNTER — Telehealth: Payer: Self-pay

## 2020-11-29 NOTE — Telephone Encounter (Signed)
Tried to contact pt on 11/29/20 at 4:22, unable [facility]

## 2020-12-03 ENCOUNTER — Other Ambulatory Visit: Payer: Self-pay | Admitting: Internal Medicine

## 2020-12-08 ENCOUNTER — Other Ambulatory Visit: Payer: Managed Care, Other (non HMO)

## 2020-12-11 ENCOUNTER — Telehealth: Payer: Self-pay

## 2020-12-11 NOTE — Telephone Encounter (Signed)
Patients vm is not set up unable to confirm 12-13-20 ultrasound.

## 2020-12-13 ENCOUNTER — Ambulatory Visit: Payer: Managed Care, Other (non HMO)

## 2020-12-13 ENCOUNTER — Other Ambulatory Visit: Payer: Self-pay

## 2020-12-13 DIAGNOSIS — D696 Thrombocytopenia, unspecified: Secondary | ICD-10-CM

## 2020-12-13 DIAGNOSIS — R945 Abnormal results of liver function studies: Secondary | ICD-10-CM | POA: Diagnosis not present

## 2020-12-13 DIAGNOSIS — R748 Abnormal levels of other serum enzymes: Secondary | ICD-10-CM

## 2020-12-19 ENCOUNTER — Other Ambulatory Visit: Payer: Self-pay | Admitting: Internal Medicine

## 2020-12-19 MED ORDER — FUROSEMIDE 20 MG PO TABS
ORAL_TABLET | ORAL | 1 refills | Status: DC
Start: 2020-12-19 — End: 2020-12-27

## 2020-12-19 NOTE — Addendum Note (Signed)
Addended by: Lyndon Code on: 12/19/2020 08:38 AM   Modules accepted: Orders

## 2020-12-24 DIAGNOSIS — D696 Thrombocytopenia, unspecified: Secondary | ICD-10-CM | POA: Insufficient documentation

## 2020-12-27 ENCOUNTER — Other Ambulatory Visit: Payer: Self-pay | Admitting: Internal Medicine

## 2020-12-27 ENCOUNTER — Other Ambulatory Visit: Payer: Self-pay

## 2020-12-27 MED ORDER — ONETOUCH ULTRA VI STRP
ORAL_STRIP | 1 refills | Status: DC
Start: 2020-12-27 — End: 2021-10-24

## 2020-12-28 ENCOUNTER — Ambulatory Visit: Payer: Managed Care, Other (non HMO) | Admitting: Nurse Practitioner

## 2020-12-29 ENCOUNTER — Ambulatory Visit: Payer: Managed Care, Other (non HMO) | Admitting: Nurse Practitioner

## 2021-01-03 ENCOUNTER — Other Ambulatory Visit: Payer: Self-pay | Admitting: Nurse Practitioner

## 2021-01-04 ENCOUNTER — Other Ambulatory Visit: Payer: Self-pay

## 2021-01-04 ENCOUNTER — Ambulatory Visit: Payer: Managed Care, Other (non HMO) | Admitting: Nurse Practitioner

## 2021-01-04 ENCOUNTER — Encounter: Payer: Self-pay | Admitting: Nurse Practitioner

## 2021-01-04 VITALS — BP 138/79 | HR 88 | Temp 97.5°F | Resp 16 | Ht 65.0 in | Wt 175.6 lb

## 2021-01-04 DIAGNOSIS — I10 Essential (primary) hypertension: Secondary | ICD-10-CM | POA: Diagnosis not present

## 2021-01-04 DIAGNOSIS — E119 Type 2 diabetes mellitus without complications: Secondary | ICD-10-CM

## 2021-01-04 DIAGNOSIS — Z794 Long term (current) use of insulin: Secondary | ICD-10-CM

## 2021-01-04 DIAGNOSIS — R569 Unspecified convulsions: Secondary | ICD-10-CM

## 2021-01-04 DIAGNOSIS — D696 Thrombocytopenia, unspecified: Secondary | ICD-10-CM

## 2021-01-04 LAB — CBC
Hematocrit: 30.9 % — ABNORMAL LOW (ref 34.0–46.6)
Hemoglobin: 10.7 g/dL — ABNORMAL LOW (ref 11.1–15.9)
MCH: 33 pg (ref 26.6–33.0)
MCHC: 34.6 g/dL (ref 31.5–35.7)
MCV: 95 fL (ref 79–97)
Platelets: 43 10*3/uL — CL (ref 150–450)
RBC: 3.24 x10E6/uL — ABNORMAL LOW (ref 3.77–5.28)
RDW: 12.3 % (ref 11.7–15.4)
WBC: 4.1 10*3/uL (ref 3.4–10.8)

## 2021-01-04 NOTE — Progress Notes (Signed)
Reviewed results with patient during visit. She has been referred to hematology for further evaluation.

## 2021-01-04 NOTE — Progress Notes (Signed)
[facility] [place], [place] [address]  Internal MEDICINE  Office Visit Note  Patient Name: Abigail Rice  [id number]  [id number]  Date of Service: 01/13/2021  Chief Complaint  Patient presents with  . Follow-up  . Depression  . Diabetes  . Hypertension  . Hyperlipidemia    The patient is here for follow up visit.  Low platelet count despite decreasing medication.  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 No other abnormalities on abdominal ultrasound.  Blood sugars doing well Blood pressure well managed.       Current Medication: Outpatient Encounter Medications as of 01/04/2021  Medication Sig  . aspirin EC 81 MG tablet Take 81 mg by mouth daily.  Marland Kitchen glucose blood (ONETOUCH ULTRA) test strip One touch ultra blue  SUGAR TESTING 1 TIME DAILY. DX e11.65  . hyoscyamine (LEVSIN) 0.125 MG tablet Take 1 tablet (0.125 mg total) by mouth every 6 (six) hours as needed for cramping.  . insulin lispro (HUMALOG) 100 UNIT/ML KiwkPen Inject into the skin. Use at lunch with sliding scale may use up to 10 units [place]  . Insulin Lispro Prot & Lispro (HUMALOG 75/25 MIX) (75-25) 100 UNIT/ML Kwikpen Inject 20 Units into the skin 2 (two) times daily with a meal.  . Insulin Pen Needle (PEN NEEDLES) 32G X 4 MM MISC 1 each by Does not apply route daily.  Marland Kitchen levETIRAcetam (KEPPRA) 500 MG tablet Take 1 tablet (500 mg total) by mouth 2 (two) times daily. (Patient taking differently: Take 500 mg by mouth daily.)  . levothyroxine (SYNTHROID) 50 MCG tablet Take 1 tablet (50 mcg total) by mouth daily before breakfast.  . losartan (COZAAR) 50 MG tablet Take 1 tablet (50 mg total) by mouth daily.  Marland Kitchen POTASSIUM PO Take by mouth.  Marland Kitchen rOPINIRole (REQUIP) 0.25 MG tablet Take 1 to 2 tablets po QHS prn  . [DISCONTINUED] amLODipine (NORVASC) 5 MG tablet Take 1 tablet (5 mg total) by mouth daily.  . [DISCONTINUED] furosemide (LASIX) 20 MG tablet TAKE 1 TABLET BY MOUTH EVERY DAY FOR 3 DAYS FOR SWELLING AND THEN  AS NEEDED   No facility-administered encounter medications on file as of 01/04/2021.    Surgical History: Past Surgical History:  Procedure Laterality Date  . CESAREAN SECTION    . COLONOSCOPY WITH PROPOFOL N/A 03/29/2016   Procedure: COLONOSCOPY WITH PROPOFOL;  Surgeon: Josefine Class, MD;  Location: [facility] ENDOSCOPY;  Service: Endoscopy;  Laterality: N/A;  . TUBAL LIGATION      Medical History: Past Medical History:  Diagnosis Date  . Bipolar disorder ([place])   . Depression   . Diabetes mellitus without complication ([place])   . Hyperlipidemia   . Hypertension     Family History: Family History  Problem Relation Age of Onset  . Diabetes Mother   . Hypertension Mother     Social History   Socioeconomic History  . Marital status: Married    Spouse name: Not on file  . Number of children: Not on file  . Years of education: Not on file  . Highest education level: Not on file  Occupational History  . Not on file  Tobacco Use  . Smoking status: Current Every Day Smoker    Types: Cigarettes  . Smokeless tobacco: Never Used  Substance and Sexual Activity  . Alcohol use: Yes    Alcohol/week: 3.0 standard drinks    Types: 3 Cans of beer per week    Comment: daily   . Drug use:  No  . Sexual activity: Not on file  Other Topics Concern  . Not on file  Social History Narrative  . Not on file   Social Determinants of Health   Financial Resource Strain: Not on file  Food Insecurity: Not on file  Transportation Needs: Not on file  Physical Activity: Not on file  Stress: Not on file  Social Connections: Not on file  Intimate Partner Violence: Not on file      Review of Systems  Constitutional: Negative for activity change, chills, fatigue and unexpected weight change.  HENT: Negative for congestion, postnasal drip, rhinorrhea, sneezing and sore throat.   Respiratory: Negative for cough, chest tightness, shortness of breath and wheezing.   Cardiovascular: Negative  for chest pain and palpitations.  Gastrointestinal: Negative for abdominal distention, abdominal pain, constipation, diarrhea, nausea and vomiting.  Endocrine: Negative for cold intolerance, heat intolerance, polydipsia and polyuria.       Blood sugars doing well   Musculoskeletal: Negative for arthralgias, back pain, joint swelling and neck pain.  Skin: Negative for rash.  Allergic/Immunologic: Negative for environmental allergies.  Neurological: Negative for dizziness, tremors, numbness and headaches.  Hematological: Negative for adenopathy. Does not bruise/bleed easily.  Psychiatric/Behavioral: Negative for behavioral problems (Depression), sleep disturbance and suicidal ideas. The patient is not nervous/anxious.     Today's Vitals   01/04/21 1441  BP: 138/79  Pulse: 88  Resp: 16  Temp: (!) 97.5 F (36.4 C)  SpO2: 99%  Weight: 175 lb 9.6 oz (79.7 kg)  Height: 5\' 5"  (1.651 m)   Body mass index is 29.22 kg/m.  Physical Exam Vitals and nursing note reviewed.  Constitutional:      General: She is not in acute distress.    Appearance: Normal appearance. She is well-developed and well-nourished. She is not diaphoretic.  HENT:     Head: Normocephalic and atraumatic.     Nose: Nose normal.     Mouth/Throat:     Mouth: Oropharynx is clear and moist.     Pharynx: No oropharyngeal exudate.  Eyes:     Extraocular Movements: EOM normal.     Pupils: Pupils are equal, round, and reactive to light.  Neck:     Thyroid: No thyromegaly.     Vascular: No carotid bruit or JVD.     Trachea: No tracheal deviation.  Cardiovascular:     Rate and Rhythm: Normal rate and regular rhythm.     Heart sounds: Normal heart sounds. No murmur heard. No friction rub. No gallop.   Pulmonary:     Effort: Pulmonary effort is normal. No respiratory distress.     Breath sounds: Normal breath sounds. No wheezing or rales.  Chest:     Chest wall: No tenderness.  Abdominal:     Palpations: Abdomen is  soft.  Musculoskeletal:        General: Normal range of motion.     Cervical back: Normal range of motion and neck supple.  Lymphadenopathy:     Cervical: No cervical adenopathy.  Skin:    General: Skin is warm and dry.  Neurological:     General: No focal deficit present.     Mental Status: She is alert and oriented to person, place, and time.     Cranial Nerves: No cranial nerve deficit.  Psychiatric:        Mood and Affect: Mood and affect and mood normal.        Behavior: Behavior normal.  Thought Content: Thought content normal.        Judgment: Judgment normal.    Assessment/Plan: 1. Thrombocytopenia (HCC) Platelet count continues to be low. 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 Refer to hematology for further evaluation.  - Ambulatory referral to Hematology  2. Type 2 diabetes mellitus without complication, with long-term current use of insulin ([place]) Continue diabetic medication as prescribed   3. Essential hypertension Continue bp medication as prescribed   4. Seizures ([place]) Continue keppra as prescribed.  General Counseling: Marlane verbalizes understanding of the findings of todays visit and agrees with plan of treatment. I have discussed any further diagnostic evaluation that may be needed or ordered today. We also reviewed her medications today. she has been encouraged to call the office with any questions or concerns that should arise related to todays visit.  This patient was seen by Leretha Pol FNP Collaboration with Dr Lavera Guise as a part of collaborative care agreement  Orders Placed This Encounter  Procedures  . Ambulatory referral to Hematology     Total time spent: 30 Minutes   Time spent includes review of chart, medications, test results, and follow up plan with the patient.      Dr Lavera Guise Internal medicine

## 2021-01-09 ENCOUNTER — Other Ambulatory Visit: Payer: Self-pay

## 2021-01-09 DIAGNOSIS — I10 Essential (primary) hypertension: Secondary | ICD-10-CM

## 2021-01-09 MED ORDER — AMLODIPINE BESYLATE 5 MG PO TABS: 5.0000 mg | ORAL_TABLET | Freq: Every day | ORAL | 1 refills | Status: DC

## 2021-01-11 ENCOUNTER — Other Ambulatory Visit: Payer: Self-pay | Admitting: Internal Medicine

## 2021-01-17 ENCOUNTER — Inpatient Hospital Stay: Payer: Managed Care, Other (non HMO) | Attending: Oncology | Admitting: Oncology

## 2021-01-17 ENCOUNTER — Other Ambulatory Visit: Payer: Self-pay

## 2021-01-17 ENCOUNTER — Inpatient Hospital Stay: Payer: Managed Care, Other (non HMO)

## 2021-01-17 ENCOUNTER — Encounter: Payer: Self-pay | Admitting: Oncology

## 2021-01-17 VITALS — BP 130/86 | HR 80 | Temp 97.3°F | Resp 16 | Wt 178.6 lb

## 2021-01-17 DIAGNOSIS — Z7982 Long term (current) use of aspirin: Secondary | ICD-10-CM | POA: Insufficient documentation

## 2021-01-17 DIAGNOSIS — Z789 Other specified health status: Secondary | ICD-10-CM

## 2021-01-17 DIAGNOSIS — Z794 Long term (current) use of insulin: Secondary | ICD-10-CM | POA: Diagnosis not present

## 2021-01-17 DIAGNOSIS — Z833 Family history of diabetes mellitus: Secondary | ICD-10-CM | POA: Insufficient documentation

## 2021-01-17 DIAGNOSIS — R7401 Elevation of levels of liver transaminase levels: Secondary | ICD-10-CM | POA: Diagnosis not present

## 2021-01-17 DIAGNOSIS — Z79899 Other long term (current) drug therapy: Secondary | ICD-10-CM | POA: Insufficient documentation

## 2021-01-17 DIAGNOSIS — Z803 Family history of malignant neoplasm of breast: Secondary | ICD-10-CM | POA: Diagnosis not present

## 2021-01-17 DIAGNOSIS — D696 Thrombocytopenia, unspecified: Secondary | ICD-10-CM

## 2021-01-17 DIAGNOSIS — F17210 Nicotine dependence, cigarettes, uncomplicated: Secondary | ICD-10-CM | POA: Insufficient documentation

## 2021-01-17 DIAGNOSIS — Z8249 Family history of ischemic heart disease and other diseases of the circulatory system: Secondary | ICD-10-CM | POA: Diagnosis not present

## 2021-01-17 DIAGNOSIS — E119 Type 2 diabetes mellitus without complications: Secondary | ICD-10-CM | POA: Diagnosis not present

## 2021-01-17 DIAGNOSIS — F319 Bipolar disorder, unspecified: Secondary | ICD-10-CM | POA: Diagnosis not present

## 2021-01-17 DIAGNOSIS — I10 Essential (primary) hypertension: Secondary | ICD-10-CM | POA: Diagnosis not present

## 2021-01-17 DIAGNOSIS — D649 Anemia, unspecified: Secondary | ICD-10-CM | POA: Insufficient documentation

## 2021-01-17 DIAGNOSIS — Z7289 Other problems related to lifestyle: Secondary | ICD-10-CM

## 2021-01-17 DIAGNOSIS — E785 Hyperlipidemia, unspecified: Secondary | ICD-10-CM | POA: Insufficient documentation

## 2021-01-17 LAB — CBC WITH DIFFERENTIAL/PLATELET
Abs Immature Granulocytes: 0.02 10*3/uL (ref 0.00–0.07)
Basophils Absolute: 0 10*3/uL (ref 0.0–0.1)
Basophils Relative: 0 %
Eosinophils Absolute: 0 10*3/uL (ref 0.0–0.5)
Eosinophils Relative: 1 %
HCT: 30.3 % — ABNORMAL LOW (ref 36.0–46.0)
Hemoglobin: 10.2 g/dL — ABNORMAL LOW (ref 12.0–15.0)
Immature Granulocytes: 0 %
Lymphocytes Relative: 39 %
Lymphs Abs: 2 10*3/uL (ref 0.7–4.0)
MCH: 33.4 pg (ref 26.0–34.0)
MCHC: 33.7 g/dL (ref 30.0–36.0)
MCV: 99.3 fL (ref 80.0–100.0)
Monocytes Absolute: 0.4 10*3/uL (ref 0.1–1.0)
Monocytes Relative: 9 %
Neutro Abs: 2.6 10*3/uL (ref 1.7–7.7)
Neutrophils Relative %: 51 %
Platelets: 27 10*3/uL — CL (ref 150–400)
RBC: 3.05 MIL/uL — ABNORMAL LOW (ref 3.87–5.11)
RDW: 14.5 % (ref 11.5–15.5)
Smear Review: NORMAL
WBC: 5.1 10*3/uL (ref 4.0–10.5)
nRBC: 0 % (ref 0.0–0.2)

## 2021-01-17 LAB — IRON AND TIBC
Iron: 156 ug/dL (ref 28–170)
Saturation Ratios: 76 % — ABNORMAL HIGH (ref 10.4–31.8)
TIBC: 206 ug/dL — ABNORMAL LOW (ref 250–450)
UIBC: 50 ug/dL

## 2021-01-17 LAB — COMPREHENSIVE METABOLIC PANEL
ALT: 90 U/L — ABNORMAL HIGH (ref 0–44)
AST: 106 U/L — ABNORMAL HIGH (ref 15–41)
Albumin: 3.2 g/dL — ABNORMAL LOW (ref 3.5–5.0)
Alkaline Phosphatase: 207 U/L — ABNORMAL HIGH (ref 38–126)
Anion gap: 8 (ref 5–15)
BUN: 16 mg/dL (ref 6–20)
CO2: 24 mmol/L (ref 22–32)
Calcium: 8.5 mg/dL — ABNORMAL LOW (ref 8.9–10.3)
Chloride: 102 mmol/L (ref 98–111)
Creatinine, Ser: 1.02 mg/dL — ABNORMAL HIGH (ref 0.44–1.00)
GFR, Estimated: >60 mL/min (ref >60)
Glucose, Bld: 110 mg/dL — ABNORMAL HIGH (ref 70–99)
Potassium: 3.5 mmol/L (ref 3.5–5.1)
Sodium: 134 mmol/L — ABNORMAL LOW (ref 135–145)
Total Bilirubin: 1.8 mg/dL — ABNORMAL HIGH (ref 0.3–1.2)
Total Protein: 8 g/dL (ref 6.5–8.1)

## 2021-01-17 LAB — PATHOLOGIST SMEAR REVIEW

## 2021-01-17 LAB — LACTATE DEHYDROGENASE: LDH: 170 U/L (ref 98–192)

## 2021-01-17 LAB — HEPATITIS PANEL, ACUTE
HCV Ab: NONREACTIVE
Hep A IgM: NONREACTIVE
Hep B C IgM: NONREACTIVE
Hepatitis B Surface Ag: NONREACTIVE

## 2021-01-17 LAB — FOLATE: Folate: 17.2 ng/mL (ref >5.9)

## 2021-01-17 LAB — IMMATURE PLATELET FRACTION: Immature Platelet Fraction: 14.7 % — ABNORMAL HIGH (ref 1.2–8.6)

## 2021-01-17 LAB — HIV ANTIBODY (ROUTINE TESTING W REFLEX): HIV Screen 4th Generation wRfx: NONREACTIVE

## 2021-01-17 LAB — FERRITIN: Ferritin: 595 ng/mL — ABNORMAL HIGH (ref 11–307)

## 2021-01-17 LAB — VITAMIN B12: Vitamin B-12: 1119 pg/mL — ABNORMAL HIGH (ref 180–914)

## 2021-01-17 NOTE — Progress Notes (Signed)
Hematology/Oncology Consult note [facility] [phone] [phone]   Patient Care Team: Khan, Fozia M, MD as PCP - General (Internal Medicine)  REFERRING PROVIDER: Boscia, Heather E, NP  CHIEF COMPLAINTS/REASON FOR VISIT:  Evaluation of thrombocytopenia  HISTORY OF PRESENTING ILLNESS:  Abigail Rice is a 54 y.o. female who was seen in consultation at the request of Boscia, Heather E, NP for evaluation of thrombocytopenia   Reviewed patient's labs done previously.  Labs showed decreased platelet counts at 43,000, wbc 4.1 hemoglobin 10.7 Reviewed patient's previous labs. Thrombocytopenia is chronic chronic onset , since at least 2019.  Platelet count in 2019 was 1 43,000.  Starting August 2020, platelet count has been in the range of 50-60,000 No aggravating or elevated factors.  Associated symptoms or signs:  Denies weight loss, fever, chills, fatigue, night sweats.  Denies hematochezia, hematuria, hematemesis, epistaxis, black tarry stool.  easy bruising.   Context:  History of hepatitis or HIV infection, denies  Patient has a history of heavy alcohol use.  She reports that she has cut down her alcohol intake since last year and currently she drinks 3-4 beer per day.  With a history of seizure and currently on Keppra.    Review of Systems  Constitutional: Negative for appetite change, chills, fatigue and fever.  HENT:   Negative for hearing loss and voice change.   Eyes: Negative for eye problems.  Respiratory: Negative for chest tightness and cough.   Cardiovascular: Negative for chest pain.  Gastrointestinal: Negative for abdominal distention, abdominal pain and blood in stool.  Endocrine: Negative for hot flashes.  Genitourinary: Negative for difficulty urinating and frequency.   Musculoskeletal: Negative for arthralgias.  Skin: Negative for itching and rash.  Neurological: Negative for extremity weakness.  Hematological:  Negative for adenopathy.  Psychiatric/Behavioral: Negative for confusion.    MEDICAL HISTORY:  Past Medical History:  Diagnosis Date  . Bipolar disorder (HCC)   . Depression   . Diabetes mellitus without complication (HCC)   . History of seizure 2020   history of 1 seizure  . Hyperlipidemia   . Hypertension     SURGICAL HISTORY: Past Surgical History:  Procedure Laterality Date  . CESAREAN SECTION    . COLONOSCOPY WITH PROPOFOL N/A 03/29/2016   Procedure: COLONOSCOPY WITH PROPOFOL;  Surgeon: Matthew Gordon Rein, MD;  Location: [facility] ENDOSCOPY;  Service: Endoscopy;  Laterality: N/A;  . TUBAL LIGATION      SOCIAL HISTORY: Social History   Socioeconomic History  . Marital status: Married    Spouse name: Not on file  . Number of children: Not on file  . Years of education: Not on file  . Highest education level: Not on file  Occupational History  . Not on file  Tobacco Use  . Smoking status: Current Every Day Smoker    Packs/day: 0.50    Years: 25.00    Pack years: 12.50    Types: Cigarettes  . Smokeless tobacco: Never Used  Vaping Use  . Vaping Use: Never used  Substance and Sexual Activity  . Alcohol use: Yes    Alcohol/week: 2.0 standard drinks    Types: 2 Cans of beer per week    Comment: daily   . Drug use: No  . Sexual activity: Not on file  Other Topics Concern  . Not on file  Social History Narrative  . Not on file   Social Determinants of Health   Financial Resource Strain: Not on file  Food   Insecurity: Not on file  Transportation Needs: Not on file  Physical Activity: Not on file  Stress: Not on file  Social Connections: Not on file  Intimate Partner Violence: Not on file    FAMILY HISTORY: Family History  Problem Relation Age of Onset  . Diabetes Mother   . Hypertension Mother   . Breast cancer Sister     ALLERGIES:  is allergic to ace inhibitors, ciprofloxacin, lisinopril, risperdal [risperidone], and vytorin  [ezetimibe-simvastatin].  MEDICATIONS:  Current Outpatient Medications  Medication Sig Dispense Refill  . amLODipine (NORVASC) 5 MG tablet Take 1 tablet (5 mg total) by mouth daily. 90 tablet 1  . aspirin EC 81 MG tablet Take 81 mg by mouth daily.    . furosemide (LASIX) 20 MG tablet TAKE 1 TABLET BY MOUTH EVERY DAY FOR 3 DAYS FOR SWELLING AND THEN AS NEEDED 15 tablet 3  . glucose blood (ONETOUCH ULTRA) test strip One touch ultra blue  SUGAR TESTING 1 TIME DAILY. DX e11.65 150 strip 1  . hyoscyamine (LEVSIN) 0.125 MG tablet Take 1 tablet (0.125 mg total) by mouth every 6 (six) hours as needed for cramping. 120 tablet 2  . Insulin Lispro Prot & Lispro (HUMALOG 75/25 MIX) (75-25) 100 UNIT/ML Kwikpen Inject 20 Units into the skin 2 (two) times daily with a meal. 15 mL 11  . Insulin Pen Needle (PEN NEEDLES) 32G X 4 MM MISC 1 each by Does not apply route daily. 100 each 3  . levETIRAcetam (KEPPRA) 500 MG tablet Take 1 tablet (500 mg total) by mouth 2 (two) times daily. (Patient taking differently: Take 500 mg by mouth daily. Taking 1 tab QD) 180 tablet 1  . levothyroxine (SYNTHROID) 50 MCG tablet Take 1 tablet (50 mcg total) by mouth daily before breakfast. 90 tablet 1  . losartan (COZAAR) 50 MG tablet Take 1 tablet (50 mg total) by mouth daily. 90 tablet 3  . POTASSIUM PO Take by mouth.    Marland Kitchen rOPINIRole (REQUIP) 0.25 MG tablet Take 1 to 2 tablets po QHS prn 120 tablet 3  . insulin lispro (HUMALOG) 100 UNIT/ML KiwkPen Inject into the skin. Use at lunch with sliding scale may use up to 10 units [place] (Patient not taking: Reported on 01/17/2021)     No current facility-administered medications for this visit.     PHYSICAL EXAMINATION: ECOG PERFORMANCE STATUS: 0 - Asymptomatic Vitals:   01/17/21 0933  BP: 130/86  Pulse: 80  Resp: 16  Temp: (!) 97.3 F (36.3 C)   Filed Weights   01/17/21 0933  Weight: 178 lb 9.6 oz (81 kg)    Physical Exam Constitutional:      General: She is not in acute  distress. HENT:     Head: Normocephalic and atraumatic.  Eyes:     General: No scleral icterus. Cardiovascular:     Rate and Rhythm: Normal rate and regular rhythm.     Heart sounds: Normal heart sounds.  Pulmonary:     Effort: Pulmonary effort is normal. No respiratory distress.     Breath sounds: No wheezing.  Abdominal:     General: Bowel sounds are normal. There is no distension.     Palpations: Abdomen is soft.  Musculoskeletal:        General: No deformity. Normal range of motion.     Cervical back: Normal range of motion and neck supple.  Skin:    General: Skin is warm and dry.     Findings: No erythema or  rash.  Neurological:     Mental Status: She is alert and oriented to person, place, and time. Mental status is at baseline.     Cranial Nerves: No cranial nerve deficit.     Coordination: Coordination normal.  Psychiatric:        Mood and Affect: Mood normal.      LABORATORY DATA:  I have reviewed the data as listed Lab Results  Component Value Date   WBC 4.1 01/03/2021   HGB 10.7 (L) 01/03/2021   HCT 30.9 (L) 01/03/2021   MCV 95 01/03/2021   PLT 43 (LL) 01/03/2021   Recent Labs    08/22/20 1349 11/01/20 0809  NA 135 136  K 4.2 4.3  CL 102 102  CO2 22 22  GLUCOSE 150* 174*  BUN 14 18  CREATININE 1.03* 1.12*  CALCIUM 8.4* 8.8  GFRNONAA 62 56*  GFRAA 71 64  PROT 7.1 7.4  ALBUMIN 3.4* 3.5*  AST 69* 61*  ALT 57* 57*  ALKPHOS 205* 287*  BILITOT 0.6 0.5   Iron/TIBC/Ferritin/ %Sat No results found for: IRON, TIBC, FERRITIN, IRONPCTSAT    RADIOGRAPHIC STUDIES: I have personally reviewed the radiological images as listed and agreed with the findings in the report.  No results found.  12/13/2020, ultrasound abdomen limited-images are not available to me Impression, mildly increased echogenicity of the liver.  Likely fatty infiltration.  Liver unremarkable contour, echotexture.  Normal size of liver and spleen.  There are innumerable tiny scattered  calcifications throughout the spleen.  ASSESSMENT & PLAN:  1. Thrombocytopenia (HCC)   2. Normocytic anemia   3. Transaminitis   4. Alcohol use     For the work up of patient's thrombocytopenia, I recommend checking CBC;CMP, LDH; smear review, folate, Vitamin B12, hepatitis, HIV,  flowcytometry and monoclonal gammopathy workup.  Also, discussed with the patient that if no clear etiology found- bone marrow biopsy would be suggested. Currently await for the above workup.   Anemia, normocytic.  Check iron TIBC ferritin.  Pending above work-up. Transaminitis, recent ultrasound abdomen results were reviewed.  ?  Alcohol hepatitis.  Check hepatitis panel.   Chronic alcohol use, may contribute to her cytopenia.  Alcohol cessation was discussed with patient.  # Patient follow-up with me in approximately 2 weeks to review the above results.   Orders Placed This Encounter  Procedures  . CBC with Differential/Platelet    Standing Status:   Future    Number of Occurrences:   1    Standing Expiration Date:   01/17/2022  . Comprehensive metabolic panel    Standing Status:   Future    Number of Occurrences:   1    Standing Expiration Date:   01/17/2022  . HIV Antibody (routine testing w rflx)    Standing Status:   Future    Number of Occurrences:   1    Standing Expiration Date:   01/17/2022  . Hepatitis panel, acute    Standing Status:   Future    Number of Occurrences:   1    Standing Expiration Date:   01/17/2022  . Immature Platelet Fraction    Standing Status:   Future    Number of Occurrences:   1    Standing Expiration Date:   01/17/2022  . Vitamin B12    Standing Status:   Future    Number of Occurrences:   1    Standing Expiration Date:   01/17/2022  . Folate    Standing   Status:   Future    Number of Occurrences:   1    Standing Expiration Date:   01/17/2022  . Protein electrophoresis, serum    Standing Status:   Future    Number of Occurrences:   1    Standing Expiration Date:    01/17/2022  . Protein Electro, Random Urine    Standing Status:   Future    Number of Occurrences:   1    Standing Expiration Date:   01/17/2022  . Lactate dehydrogenase    Standing Status:   Future    Number of Occurrences:   1    Standing Expiration Date:   01/17/2022  . Iron and TIBC    Standing Status:   Future    Number of Occurrences:   1    Standing Expiration Date:   01/17/2022  . Ferritin    Standing Status:   Future    Number of Occurrences:   1    Standing Expiration Date:   07/17/2021  . Technologist smear review    Standing Status:   Future    Number of Occurrences:   1    Standing Expiration Date:   01/17/2022    All questions were answered. The patient knows to call the clinic with any problems questions or concerns.  Cc Boscia, Heather E, NP  Return of visit: 2 weeks.    Zhou Yu, MD, PhD 01/17/2021 

## 2021-01-17 NOTE — Progress Notes (Signed)
New patient evaluation.   

## 2021-01-18 LAB — PROTEIN ELECTROPHORESIS, SERUM
A/G Ratio: 0.7 (ref 0.7–1.7)
Albumin ELP: 3.1 g/dL (ref 2.9–4.4)
Alpha-1-Globulin: 0.3 g/dL (ref 0.0–0.4)
Alpha-2-Globulin: 0.6 g/dL (ref 0.4–1.0)
Beta Globulin: 1 g/dL (ref 0.7–1.3)
Gamma Globulin: 2.7 g/dL — ABNORMAL HIGH (ref 0.4–1.8)
Globulin, Total: 4.5 g/dL — ABNORMAL HIGH (ref 2.2–3.9)
Total Protein ELP: 7.6 g/dL (ref 6.0–8.5)

## 2021-01-19 LAB — PROTEIN ELECTRO, RANDOM URINE: Total Protein, Urine: 16 mg/dL

## 2021-01-24 ENCOUNTER — Telehealth: Payer: Self-pay

## 2021-01-24 DIAGNOSIS — D696 Thrombocytopenia, unspecified: Secondary | ICD-10-CM

## 2021-01-24 NOTE — Telephone Encounter (Signed)
-----   Message from Earlie Server, MD sent at 01/23/2021 10:52 PM EST ----- Please arrange her to do hemochromatosis mutation testing asap thanks.

## 2021-01-24 NOTE — Telephone Encounter (Signed)
Patient notified.  The only available time she can come is tomorrow morning at 8:30.  Will get scheduling to check if there is a problem with that time and will call her if can't do tomorrow morning.   Please schedule lab appt tomorrow morning at 8:30.

## 2021-01-24 NOTE — Telephone Encounter (Signed)
Called patient to inform her that she could come at 845 due to the lab schedule. I scheduled her at 51 but directed her to call me if that did not work.

## 2021-01-25 ENCOUNTER — Inpatient Hospital Stay: Payer: Managed Care, Other (non HMO) | Attending: Oncology

## 2021-01-25 DIAGNOSIS — F17210 Nicotine dependence, cigarettes, uncomplicated: Secondary | ICD-10-CM | POA: Insufficient documentation

## 2021-01-25 DIAGNOSIS — E785 Hyperlipidemia, unspecified: Secondary | ICD-10-CM | POA: Insufficient documentation

## 2021-01-25 DIAGNOSIS — Z7982 Long term (current) use of aspirin: Secondary | ICD-10-CM | POA: Insufficient documentation

## 2021-01-25 DIAGNOSIS — I10 Essential (primary) hypertension: Secondary | ICD-10-CM | POA: Insufficient documentation

## 2021-01-25 DIAGNOSIS — D649 Anemia, unspecified: Secondary | ICD-10-CM | POA: Insufficient documentation

## 2021-01-25 DIAGNOSIS — Z803 Family history of malignant neoplasm of breast: Secondary | ICD-10-CM | POA: Diagnosis not present

## 2021-01-25 DIAGNOSIS — D696 Thrombocytopenia, unspecified: Secondary | ICD-10-CM

## 2021-01-25 DIAGNOSIS — Z794 Long term (current) use of insulin: Secondary | ICD-10-CM | POA: Diagnosis not present

## 2021-01-25 DIAGNOSIS — E119 Type 2 diabetes mellitus without complications: Secondary | ICD-10-CM | POA: Insufficient documentation

## 2021-01-25 DIAGNOSIS — Z8249 Family history of ischemic heart disease and other diseases of the circulatory system: Secondary | ICD-10-CM | POA: Insufficient documentation

## 2021-01-25 DIAGNOSIS — Z7952 Long term (current) use of systemic steroids: Secondary | ICD-10-CM | POA: Insufficient documentation

## 2021-01-25 DIAGNOSIS — Z79899 Other long term (current) drug therapy: Secondary | ICD-10-CM | POA: Diagnosis not present

## 2021-01-29 LAB — HEMOCHROMATOSIS DNA-PCR(C282Y,H63D)

## 2021-01-31 ENCOUNTER — Inpatient Hospital Stay (HOSPITAL_BASED_OUTPATIENT_CLINIC_OR_DEPARTMENT_OTHER): Payer: Managed Care, Other (non HMO) | Admitting: Oncology

## 2021-01-31 ENCOUNTER — Encounter: Payer: Self-pay | Admitting: Oncology

## 2021-01-31 VITALS — BP 119/78 | HR 79 | Temp 98.5°F | Resp 18 | Wt 181.2 lb

## 2021-01-31 DIAGNOSIS — D696 Thrombocytopenia, unspecified: Secondary | ICD-10-CM

## 2021-01-31 DIAGNOSIS — R7401 Elevation of levels of liver transaminase levels: Secondary | ICD-10-CM | POA: Diagnosis not present

## 2021-01-31 DIAGNOSIS — Z789 Other specified health status: Secondary | ICD-10-CM

## 2021-01-31 DIAGNOSIS — R790 Abnormal level of blood mineral: Secondary | ICD-10-CM

## 2021-01-31 DIAGNOSIS — D649 Anemia, unspecified: Secondary | ICD-10-CM | POA: Diagnosis not present

## 2021-01-31 DIAGNOSIS — Z7289 Other problems related to lifestyle: Secondary | ICD-10-CM

## 2021-01-31 MED ORDER — DEXAMETHASONE 4 MG PO TABS: 40.0000 mg | ORAL_TABLET | Freq: Every day | ORAL | 0 refills | Status: DC

## 2021-01-31 NOTE — Progress Notes (Signed)
Hematology/Oncology followup note [facility] [phone] [phone]   Patient Care Team: Lavera Guise, MD as PCP - General (Internal Medicine)  REFERRING PROVIDER: Lavera Guise, MD  CHIEF COMPLAINTS/REASON FOR VISIT:   thrombocytopenia  HISTORY OF PRESENTING ILLNESS:  Abigail Rice is a 55 y.o. female who was seen in consultation at the request of Lavera Guise, MD for evaluation of thrombocytopenia   Reviewed patient's labs done previously.  Labs showed decreased platelet counts at 43,000, wbc 4.1 hemoglobin 10.7 Reviewed patient's previous labs. Thrombocytopenia is chronic chronic onset , since at least 2019.  Platelet count in 2019 was 1 43,000.  Starting August 2020, platelet count has been in the range of 50-60,000 No aggravating or elevated factors.  Associated symptoms or signs:  Denies weight loss, fever, chills, fatigue, night sweats.  Denies hematochezia, hematuria, hematemesis, epistaxis, black tarry stool.  easy bruising.   Context:  History of hepatitis or HIV infection, denies  Patient has a history of heavy alcohol use.  She reports that she has cut down her alcohol intake since last year and currently she drinks 3-4 beer per day.  With a history of seizure and currently on Keppra.  INTERVAL HISTORY Abigail Rice is a 55 y.o. female who has above history reviewed by me today presents for follow up visit for management of thrombocytopenia. Problems and complaints are listed below: Patient has had blood work done at the last visit.  She presents to discuss results and management plan.  Denies any active bleeding events.  She continues to drink alcohol.   Review of Systems  Constitutional: Negative for appetite change, chills, fatigue and fever.  HENT:   Negative for hearing loss and voice change.   Eyes: Negative for eye problems.  Respiratory: Negative for chest tightness and cough.   Cardiovascular: Negative for  chest pain.  Gastrointestinal: Negative for abdominal distention, abdominal pain and blood in stool.  Endocrine: Negative for hot flashes.  Genitourinary: Negative for difficulty urinating and frequency.   Musculoskeletal: Negative for arthralgias.  Skin: Negative for itching and rash.  Neurological: Negative for extremity weakness.  Hematological: Negative for adenopathy.  Psychiatric/Behavioral: Negative for confusion.    MEDICAL HISTORY:  Past Medical History:  Diagnosis Date  . Bipolar disorder ([place])   . Depression   . Diabetes mellitus without complication ([place])   . History of seizure 2020   history of 1 seizure  . Hyperlipidemia   . Hypertension     SURGICAL HISTORY: Past Surgical History:  Procedure Laterality Date  . CESAREAN SECTION    . COLONOSCOPY WITH PROPOFOL N/A 03/29/2016   Procedure: COLONOSCOPY WITH PROPOFOL;  Surgeon: Josefine Class, MD;  Location: [facility]COPY;  Service: Endoscopy;  Laterality: N/A;  . TUBAL LIGATION      SOCIAL HISTORY: Social History   Socioeconomic History  . Marital status: Married    Spouse name: Not on file  . Number of children: Not on file  . Years of education: Not on file  . Highest education level: Not on file  Occupational History  . Not on file  Tobacco Use  . Smoking status: Current Every Day Smoker    Packs/day: 0.50    Years: 25.00    Pack years: 12.50    Types: Cigarettes  . Smokeless tobacco: Never Used  Vaping Use  . Vaping Use: Never used  Substance and Sexual Activity  . Alcohol use: Yes    Alcohol/week: 2.0 standard  drinks    Types: 2 Cans of beer per week    Comment: daily   . Drug use: No  . Sexual activity: Not on file  Other Topics Concern  . Not on file  Social History Narrative  . Not on file   Social Determinants of Health   Financial Resource Strain: Not on file  Food Insecurity: Not on file  Transportation Needs: Not on file  Physical Activity: Not on file  Stress: Not on file   Social Connections: Not on file  Intimate Partner Violence: Not on file    FAMILY HISTORY: Family History  Problem Relation Age of Onset  . Diabetes Mother   . Hypertension Mother   . Breast cancer Sister     ALLERGIES:  is allergic to ace inhibitors, ciprofloxacin, lisinopril, risperdal [risperidone], and vytorin [ezetimibe-simvastatin].  MEDICATIONS:  Current Outpatient Medications  Medication Sig Dispense Refill  . amLODipine (NORVASC) 5 MG tablet Take 1 tablet (5 mg total) by mouth daily. 90 tablet 1  . aspirin EC 81 MG tablet Take 81 mg by mouth daily.    Marland Kitchen dexamethasone (DECADRON) 4 MG tablet Take 10 tablets (40 mg total) by mouth daily. 40 tablet 0  . furosemide (LASIX) 20 MG tablet TAKE 1 TABLET BY MOUTH EVERY DAY FOR 3 DAYS FOR SWELLING AND THEN AS NEEDED 15 tablet 3  . glucose blood (ONETOUCH ULTRA) test strip One touch ultra blue  SUGAR TESTING 1 TIME DAILY. DX e11.65 150 strip 1  . hyoscyamine (LEVSIN) 0.125 MG tablet Take 1 tablet (0.125 mg total) by mouth every 6 (six) hours as needed for cramping. 120 tablet 2  . insulin lispro (HUMALOG) 100 UNIT/ML KiwkPen Inject into the skin. Use at lunch with sliding scale may use up to 10 units [place]    . Insulin Lispro Prot & Lispro (HUMALOG 75/25 MIX) (75-25) 100 UNIT/ML Kwikpen Inject 20 Units into the skin 2 (two) times daily with a meal. 15 mL 11  . Insulin Pen Needle (PEN NEEDLES) 32G X 4 MM MISC 1 each by Does not apply route daily. 100 each 3  . levETIRAcetam (KEPPRA) 500 MG tablet Take 1 tablet (500 mg total) by mouth 2 (two) times daily. (Patient taking differently: Take 500 mg by mouth daily. Taking 1 tab QD) 180 tablet 1  . levothyroxine (SYNTHROID) 50 MCG tablet Take 1 tablet (50 mcg total) by mouth daily before breakfast. 90 tablet 1  . losartan (COZAAR) 50 MG tablet Take 1 tablet (50 mg total) by mouth daily. 90 tablet 3  . POTASSIUM PO Take by mouth.    Marland Kitchen rOPINIRole (REQUIP) 0.25 MG tablet Take 1 to 2 tablets po QHS  prn 120 tablet 3   No current facility-administered medications for this visit.     PHYSICAL EXAMINATION: ECOG PERFORMANCE STATUS: 0 - Asymptomatic Vitals:   01/31/21 1049  BP: 119/78  Pulse: 79  Resp: 18  Temp: 98.5 F (36.9 C)   Filed Weights   01/31/21 1049  Weight: 181 lb 3.2 oz (82.2 kg)    Physical Exam Constitutional:      General: She is not in acute distress. HENT:     Head: Normocephalic and atraumatic.  Eyes:     General: No scleral icterus. Cardiovascular:     Rate and Rhythm: Normal rate and regular rhythm.     Heart sounds: Normal heart sounds.  Pulmonary:     Effort: Pulmonary effort is normal. No respiratory distress.  Breath sounds: No wheezing.  Abdominal:     General: Bowel sounds are normal. There is no distension.     Palpations: Abdomen is soft.  Musculoskeletal:        General: No deformity. Normal range of motion.     Cervical back: Normal range of motion and neck supple.  Skin:    General: Skin is warm and dry.     Findings: No erythema or rash.  Neurological:     Mental Status: She is alert and oriented to person, place, and time. Mental status is at baseline.     Cranial Nerves: No cranial nerve deficit.     Coordination: Coordination normal.  Psychiatric:        Mood and Affect: Mood normal.      LABORATORY DATA:  I have reviewed the data as listed Lab Results  Component Value Date   WBC 5.1 01/17/2021   HGB 10.2 (L) 01/17/2021   HCT 30.3 (L) 01/17/2021   MCV 99.3 01/17/2021   PLT 27 (LL) 01/17/2021   Recent Labs    08/22/20 1349 11/01/20 0809 01/17/21 1120  NA 135 136 134*  K 4.2 4.3 3.5  CL 102 102 102  CO2 22 22 24   GLUCOSE 150* 174* 110*  BUN 14 18 16   CREATININE 1.03* 1.12* 1.02*  CALCIUM 8.4* 8.8 8.5*  GFRNONAA 62 56* >60  GFRAA 71 64  --   PROT 7.1 7.4 8.0  ALBUMIN 3.4* 3.5* 3.2*  AST 69* 61* 106*  ALT 57* 57* 90*  ALKPHOS 205* 287* 207*  BILITOT 0.6 0.5 1.8*   Iron/TIBC/Ferritin/ %Sat     Component Value Date/Time   IRON 156 01/17/2021 1120   TIBC 206 (L) 01/17/2021 1120   FERRITIN 595 (H) 01/17/2021 1120   IRONPCTSAT 76 (H) 01/17/2021 1120      RADIOGRAPHIC STUDIES: I have personally reviewed the radiological images as listed and agreed with the findings in the report.  No results found.  12/13/2020, ultrasound abdomen limited-images are not available to me Impression, mildly increased echogenicity of the liver.  Likely fatty infiltration.  Liver unremarkable contour, echotexture.  Normal size of liver and spleen.  There are innumerable tiny scattered calcifications throughout the spleen.  ASSESSMENT & PLAN:  1. Thrombocytopenia ([place])   2. Normocytic anemia   3. Transaminitis   4. Alcohol use   5. Abnormal result of iron profile testing    #Thrombocytopenia Labs were reviewed and discussed with the patient. Patient has adequate B12 and folate level, negative hepatitis and HIV panel.  Smear showed no schistocytes. Increased immature platelet fraction indicating possible peripheral destruction. ITP versus other etiologies. I recommend patient to start a trial of dexamethasone 40 mg daily for 4 days. Repeat CBC next week. If she responds to treatment, will follow up her CBC closely to see if any consolidation treatment is needed. If not responding to treatment.  I recommend bone marrow biopsy for further evaluation  #Anemia, SPEP showed no monoclonal protein.  Urine electrophoresis was not done due to insufficient specimen.  We will repeat in the future. Iron panel was reviewed.  She has increased ferritin level of 595, increased saturation of 76.  Hemochromatosis screening testing was negative.  Abnormal iron testing may be related to chronic alcohol use and liver injury.  Discussed with patient. Strongly recommend her to stop completely stop consuming alcohol.  #Follow-up to be determined-depending on her response to dexamethasone treatment.  No orders of the  defined types were  placed in this encounter.   All questions were answered. The patient knows to call the clinic with any problems questions or concerns.  Cc Lavera Guise, MD   Earlie Server, MD, PhD 01/31/2021

## 2021-01-31 NOTE — Progress Notes (Signed)
Patient denies new problems/concerns today.   °

## 2021-02-06 ENCOUNTER — Other Ambulatory Visit: Payer: Self-pay

## 2021-02-06 DIAGNOSIS — D696 Thrombocytopenia, unspecified: Secondary | ICD-10-CM

## 2021-02-07 ENCOUNTER — Telehealth: Payer: Self-pay

## 2021-02-07 ENCOUNTER — Inpatient Hospital Stay: Payer: Managed Care, Other (non HMO)

## 2021-02-07 ENCOUNTER — Other Ambulatory Visit: Payer: Self-pay

## 2021-02-07 DIAGNOSIS — D649 Anemia, unspecified: Secondary | ICD-10-CM | POA: Diagnosis not present

## 2021-02-07 DIAGNOSIS — D696 Thrombocytopenia, unspecified: Secondary | ICD-10-CM

## 2021-02-07 LAB — CBC WITH DIFFERENTIAL/PLATELET
Abs Immature Granulocytes: 0.05 10*3/uL (ref 0.00–0.07)
Basophils Absolute: 0 10*3/uL (ref 0.0–0.1)
Basophils Relative: 0 %
Eosinophils Absolute: 0.1 10*3/uL (ref 0.0–0.5)
Eosinophils Relative: 1 %
HCT: 32.9 % — ABNORMAL LOW (ref 36.0–46.0)
Hemoglobin: 11.2 g/dL — ABNORMAL LOW (ref 12.0–15.0)
Immature Granulocytes: 1 %
Lymphocytes Relative: 30 %
Lymphs Abs: 2.3 10*3/uL (ref 0.7–4.0)
MCH: 34.3 pg — ABNORMAL HIGH (ref 26.0–34.0)
MCHC: 34 g/dL (ref 30.0–36.0)
MCV: 100.6 fL — ABNORMAL HIGH (ref 80.0–100.0)
Monocytes Absolute: 0.7 10*3/uL (ref 0.1–1.0)
Monocytes Relative: 9 %
Neutro Abs: 4.6 10*3/uL (ref 1.7–7.7)
Neutrophils Relative %: 59 %
Platelets: 80 10*3/uL — ABNORMAL LOW (ref 150–400)
RBC: 3.27 MIL/uL — ABNORMAL LOW (ref 3.87–5.11)
RDW: 14 % (ref 11.5–15.5)
WBC: 7.7 10*3/uL (ref 4.0–10.5)
nRBC: 0 % (ref 0.0–0.2)

## 2021-02-07 NOTE — Telephone Encounter (Signed)
Done.. Pt will RTC on 02/14/21 for lab/MD Pt is aware

## 2021-02-07 NOTE — Telephone Encounter (Signed)
Patient aware of results.  Please schedule as MD recommends and notify patient of appointment details.

## 2021-02-07 NOTE — Telephone Encounter (Signed)
-----   Message from Earlie Server, MD sent at 02/07/2021  1:06 PM EST ----- Please let her know that her platelet count has improved to 80.  Arrange her to do a follow up with me lab cbc only MD next week.

## 2021-02-14 ENCOUNTER — Telehealth: Payer: Self-pay

## 2021-02-14 ENCOUNTER — Inpatient Hospital Stay (HOSPITAL_BASED_OUTPATIENT_CLINIC_OR_DEPARTMENT_OTHER): Payer: Managed Care, Other (non HMO) | Admitting: Oncology

## 2021-02-14 ENCOUNTER — Other Ambulatory Visit: Payer: Self-pay

## 2021-02-14 ENCOUNTER — Inpatient Hospital Stay: Payer: Managed Care, Other (non HMO)

## 2021-02-14 ENCOUNTER — Encounter: Payer: Self-pay | Admitting: Oncology

## 2021-02-14 VITALS — BP 122/80 | HR 87 | Temp 97.6°F | Resp 18 | Wt 182.8 lb

## 2021-02-14 DIAGNOSIS — D696 Thrombocytopenia, unspecified: Secondary | ICD-10-CM

## 2021-02-14 DIAGNOSIS — Z7289 Other problems related to lifestyle: Secondary | ICD-10-CM

## 2021-02-14 DIAGNOSIS — R7401 Elevation of levels of liver transaminase levels: Secondary | ICD-10-CM | POA: Diagnosis not present

## 2021-02-14 DIAGNOSIS — D649 Anemia, unspecified: Secondary | ICD-10-CM

## 2021-02-14 DIAGNOSIS — Z789 Other specified health status: Secondary | ICD-10-CM

## 2021-02-14 LAB — CBC WITH DIFFERENTIAL/PLATELET
Abs Immature Granulocytes: 0.01 10*3/uL (ref 0.00–0.07)
Basophils Absolute: 0 10*3/uL (ref 0.0–0.1)
Basophils Relative: 0 %
Eosinophils Absolute: 0.1 10*3/uL (ref 0.0–0.5)
Eosinophils Relative: 1 %
HCT: 33.3 % — ABNORMAL LOW (ref 36.0–46.0)
Hemoglobin: 11.4 g/dL — ABNORMAL LOW (ref 12.0–15.0)
Immature Granulocytes: 0 %
Lymphocytes Relative: 40 %
Lymphs Abs: 2.5 10*3/uL (ref 0.7–4.0)
MCH: 33.6 pg (ref 26.0–34.0)
MCHC: 34.2 g/dL (ref 30.0–36.0)
MCV: 98.2 fL (ref 80.0–100.0)
Monocytes Absolute: 0.7 10*3/uL (ref 0.1–1.0)
Monocytes Relative: 11 %
Neutro Abs: 3 10*3/uL (ref 1.7–7.7)
Neutrophils Relative %: 48 %
Platelets: 53 10*3/uL — ABNORMAL LOW (ref 150–400)
RBC: 3.39 MIL/uL — ABNORMAL LOW (ref 3.87–5.11)
RDW: 12.8 % (ref 11.5–15.5)
WBC: 6.3 10*3/uL (ref 4.0–10.5)
nRBC: 0 % (ref 0.0–0.2)

## 2021-02-14 NOTE — Telephone Encounter (Signed)
Order for bone marrow bx faxed to specialty scheduling.  If bx not scheduled within 2 weeks Dr. Tasia Catchings wants her to come for CBC next week.

## 2021-02-14 NOTE — Progress Notes (Signed)
Hematology/Oncology followup note [facility] [phone] [phone]   Patient Care Team: Lavera Guise, MD as PCP - General (Internal Medicine)  REFERRING PROVIDER: Lavera Guise, MD  CHIEF COMPLAINTS/REASON FOR VISIT:   thrombocytopenia  HISTORY OF PRESENTING ILLNESS:  Abigail Rice is a 55 y.o. female who was seen in consultation at the request of Lavera Guise, MD for evaluation of thrombocytopenia   Reviewed patient's labs done previously.  Labs showed decreased platelet counts at 43,000, wbc 4.1 hemoglobin 10.7 Reviewed patient's previous labs. Thrombocytopenia is chronic chronic onset , since at least 2019.  Platelet count in 2019 was 1 43,000.  Starting August 2020, platelet count has been in the range of 50-60,000 No aggravating or elevated factors.  Associated symptoms or signs:  Denies weight loss, fever, chills, fatigue, night sweats.  Denies hematochezia, hematuria, hematemesis, epistaxis, black tarry stool.  easy bruising.   Context:  History of hepatitis or HIV infection, denies  Patient has a history of heavy alcohol use.  She reports that she has cut down her alcohol intake since last year and currently she drinks 3-4 beer per day.  With a history of seizure and currently on Keppra.  INTERVAL HISTORY Abigail Rice is a 55 y.o. female who has above history reviewed by me today presents for follow up visit for management of thrombocytopenia. Problems and complaints are listed below: Patient is status post a course of dexamethasone 40 mg for 4 days.  Platelet increased to 80,000.  Today she presents to recheck blood work and discuss future management plan.     Review of Systems  Constitutional: Negative for appetite change, chills, fatigue and fever.  HENT:   Negative for hearing loss and voice change.   Eyes: Negative for eye problems.  Respiratory: Negative for chest tightness and cough.   Cardiovascular: Negative  for chest pain.  Gastrointestinal: Negative for abdominal distention, abdominal pain and blood in stool.  Endocrine: Negative for hot flashes.  Genitourinary: Negative for difficulty urinating and frequency.   Musculoskeletal: Negative for arthralgias.  Skin: Negative for itching and rash.  Neurological: Negative for extremity weakness.  Hematological: Negative for adenopathy.  Psychiatric/Behavioral: Negative for confusion.    MEDICAL HISTORY:  Past Medical History:  Diagnosis Date  . Bipolar disorder ([place])   . Depression   . Diabetes mellitus without complication ([place])   . History of seizure 2020   history of 1 seizure  . Hyperlipidemia   . Hypertension     SURGICAL HISTORY: Past Surgical History:  Procedure Laterality Date  . CESAREAN SECTION    . COLONOSCOPY WITH PROPOFOL N/A 03/29/2016   Procedure: COLONOSCOPY WITH PROPOFOL;  Surgeon: Josefine Class, MD;  Location: [facility] ENDOSCOPY;  Service: Endoscopy;  Laterality: N/A;  . TUBAL LIGATION      SOCIAL HISTORY: Social History   Socioeconomic History  . Marital status: Married    Spouse name: Not on file  . Number of children: Not on file  . Years of education: Not on file  . Highest education level: Not on file  Occupational History  . Not on file  Tobacco Use  . Smoking status: Current Every Day Smoker    Packs/day: 0.50    Years: 25.00    Pack years: 12.50    Types: Cigarettes  . Smokeless tobacco: Never Used  Vaping Use  . Vaping Use: Never used  Substance and Sexual Activity  . Alcohol use: Yes    Alcohol/week:  2.0 standard drinks    Types: 2 Cans of beer per week    Comment: daily   . Drug use: No  . Sexual activity: Not on file  Other Topics Concern  . Not on file  Social History Narrative  . Not on file   Social Determinants of Health   Financial Resource Strain: Not on file  Food Insecurity: Not on file  Transportation Needs: Not on file  Physical Activity: Not on file  Stress: Not on  file  Social Connections: Not on file  Intimate Partner Violence: Not on file    FAMILY HISTORY: Family History  Problem Relation Age of Onset  . Diabetes Mother   . Hypertension Mother   . Breast cancer Sister     ALLERGIES:  is allergic to ace inhibitors, ciprofloxacin, lisinopril, risperdal [risperidone], and vytorin [ezetimibe-simvastatin].  MEDICATIONS:  Current Outpatient Medications  Medication Sig Dispense Refill  . amLODipine (NORVASC) 5 MG tablet Take 1 tablet (5 mg total) by mouth daily. 90 tablet 1  . aspirin EC 81 MG tablet Take 81 mg by mouth daily.    Marland Kitchen glucose blood (ONETOUCH ULTRA) test strip One touch ultra blue  SUGAR TESTING 1 TIME DAILY. DX e11.65 150 strip 1  . hyoscyamine (LEVSIN) 0.125 MG tablet Take 1 tablet (0.125 mg total) by mouth every 6 (six) hours as needed for cramping. 120 tablet 2  . Insulin Lispro Prot & Lispro (HUMALOG 75/25 MIX) (75-25) 100 UNIT/ML Kwikpen Inject 20 Units into the skin 2 (two) times daily with a meal. 15 mL 11  . Insulin Pen Needle (PEN NEEDLES) 32G X 4 MM MISC 1 each by Does not apply route daily. 100 each 3  . levETIRAcetam (KEPPRA) 500 MG tablet Take 1 tablet (500 mg total) by mouth 2 (two) times daily. (Patient taking differently: Take 500 mg by mouth daily. Taking 1/2 tab QD) 180 tablet 1  . levothyroxine (SYNTHROID) 50 MCG tablet Take 1 tablet (50 mcg total) by mouth daily before breakfast. 90 tablet 1  . losartan (COZAAR) 50 MG tablet Take 1 tablet (50 mg total) by mouth daily. 90 tablet 3  . POTASSIUM PO Take by mouth.    Marland Kitchen rOPINIRole (REQUIP) 0.25 MG tablet Take 1 to 2 tablets po QHS prn 120 tablet 3  . dexamethasone (DECADRON) 4 MG tablet Take 10 tablets (40 mg total) by mouth daily. (Patient not taking: Reported on 02/14/2021) 40 tablet 0  . furosemide (LASIX) 20 MG tablet TAKE 1 TABLET BY MOUTH EVERY DAY FOR 3 DAYS FOR SWELLING AND THEN AS NEEDED (Patient not taking: Reported on 02/14/2021) 15 tablet 3  . insulin lispro  (HUMALOG) 100 UNIT/ML KiwkPen Inject into the skin. Use at lunch with sliding scale may use up to 10 units [place] (Patient not taking: Reported on 02/14/2021)     No current facility-administered medications for this visit.     PHYSICAL EXAMINATION: ECOG PERFORMANCE STATUS: 0 - Asymptomatic Vitals:   02/14/21 1103  BP: 122/80  Pulse: 87  Resp: 18  Temp: 97.6 F (36.4 C)   Filed Weights   02/14/21 1103  Weight: 182 lb 12.8 oz (82.9 kg)    Physical Exam Constitutional:      General: She is not in acute distress. HENT:     Head: Normocephalic and atraumatic.  Eyes:     General: No scleral icterus. Cardiovascular:     Rate and Rhythm: Normal rate and regular rhythm.     Heart sounds: Normal  heart sounds.  Pulmonary:     Effort: Pulmonary effort is normal. No respiratory distress.     Breath sounds: No wheezing.  Abdominal:     General: Bowel sounds are normal. There is no distension.     Palpations: Abdomen is soft.  Musculoskeletal:        General: No deformity. Normal range of motion.     Cervical back: Normal range of motion and neck supple.  Skin:    General: Skin is warm and dry.     Findings: No erythema or rash.  Neurological:     Mental Status: She is alert and oriented to person, place, and time. Mental status is at baseline.     Cranial Nerves: No cranial nerve deficit.     Coordination: Coordination normal.  Psychiatric:        Mood and Affect: Mood normal.      LABORATORY DATA:  I have reviewed the data as listed Lab Results  Component Value Date   WBC 6.3 02/14/2021   HGB 11.4 (L) 02/14/2021   HCT 33.3 (L) 02/14/2021   MCV 98.2 02/14/2021   PLT 53 (L) 02/14/2021   Recent Labs    08/22/20 1349 11/01/20 0809 01/17/21 1120  NA 135 136 134*  K 4.2 4.3 3.5  CL 102 102 102  CO2 22 22 24   GLUCOSE 150* 174* 110*  BUN 14 18 16   CREATININE 1.03* 1.12* 1.02*  CALCIUM 8.4* 8.8 8.5*  GFRNONAA 62 56* >60  GFRAA 71 64  --   PROT 7.1 7.4 8.0  ALBUMIN  3.4* 3.5* 3.2*  AST 69* 61* 106*  ALT 57* 57* 90*  ALKPHOS 205* 287* 207*  BILITOT 0.6 0.5 1.8*   Iron/TIBC/Ferritin/ %Sat    Component Value Date/Time   IRON 156 01/17/2021 1120   TIBC 206 (L) 01/17/2021 1120   FERRITIN 595 (H) 01/17/2021 1120   IRONPCTSAT 76 (H) 01/17/2021 1120      RADIOGRAPHIC STUDIES: I have personally reviewed the radiological images as listed and agreed with the findings in the report.  No results found.  12/13/2020, ultrasound abdomen limited-images are not available to me Impression, mildly increased echogenicity of the liver.  Likely fatty infiltration.  Liver unremarkable contour, echotexture.  Normal size of liver and spleen.  There are innumerable tiny scattered calcifications throughout the spleen.  ASSESSMENT & PLAN:  1. Thrombocytopenia ([place])   2. Normocytic anemia   3. Transaminitis   4. Alcohol use    #Thrombocytopenia possible due to peripheral destruction,  patient responded to dexamethasone 40 mg daily for 4 days. Labs are reviewed and discussed with patient. Platelet count seems to start to trend down to 53,000 again today. Anticipate that she may not be able to keep her platelet count above 30,000  Discussed with her about options of rechallenge with steroids with slow taper, Rituximab treatment for consolidation. Discussed with patient that ITP is a diagnosis of exclusion.  I recommend bone marrow biopsy to rule out underlying low-grade lymphoma prior to putting her on rituximab treatments.  She agrees.  We will arrange bone marrow biopsy.  Abnormal iron panel She has increased ferritin level of 595, increased saturation of 76.  Hemochromatosis screening testing was negative.  Abnormal iron testing may be related to chronic alcohol use and liver injury.  Alcohol cessation.  #Follow-up 1 week after bone marrow biopsy  Orders Placed This Encounter  Procedures  . CT BONE MARROW BIOPSY & ASPIRATION    Standing Status:  Future     Standing Expiration Date:   02/14/2022    Order Specific Question:   Reason for Exam (SYMPTOM  OR DIAGNOSIS REQUIRED)    Answer:   Thrombocytopenia    Order Specific Question:   Preferred location?    Answer:   [facility]    Order Specific Question:   Radiology Contrast Protocol - do NOT remove file path    Answer:   [email or web address]    Order Specific Question:   Is patient pregnant?    Answer:   No    All questions were answered. The patient knows to call the clinic with any problems questions or concerns.  Cc Lavera Guise, MD   Earlie Server, MD, PhD 02/14/2021

## 2021-02-14 NOTE — Progress Notes (Signed)
Patient denies new problems/concerns today.   °

## 2021-02-15 ENCOUNTER — Other Ambulatory Visit: Payer: Self-pay | Admitting: Internal Medicine

## 2021-02-15 NOTE — Telephone Encounter (Signed)
Patient's husband notified of BM biopsy date as well. He said pt may call so we can go over these with her. I will be mailing appts as well.   [place] Silos in case she calls

## 2021-02-15 NOTE — Telephone Encounter (Signed)
Done..  Pt was at work when I called. I spoke with her husband RESHA FILIPPONE  And made him aware of her sched appt for labs  And her sched 03/05/21 MD appt

## 2021-02-15 NOTE — Telephone Encounter (Addendum)
Abigail Rice is scheduled for CT Bone Marrow Biopsy on Mon 02/26/22 @ 8:30a  Arrive 7:30a. IR nurse will call her a few days before biopsy to go over any special instructions.  Please schedule lab one day next week and MD follow up 1 week after biopsy.

## 2021-02-20 ENCOUNTER — Other Ambulatory Visit: Payer: Self-pay

## 2021-02-20 DIAGNOSIS — D649 Anemia, unspecified: Secondary | ICD-10-CM

## 2021-02-20 DIAGNOSIS — D696 Thrombocytopenia, unspecified: Secondary | ICD-10-CM

## 2021-02-21 ENCOUNTER — Other Ambulatory Visit: Payer: Self-pay

## 2021-02-21 ENCOUNTER — Inpatient Hospital Stay: Payer: Managed Care, Other (non HMO) | Attending: Oncology

## 2021-02-21 DIAGNOSIS — E119 Type 2 diabetes mellitus without complications: Secondary | ICD-10-CM | POA: Diagnosis not present

## 2021-02-21 DIAGNOSIS — Z833 Family history of diabetes mellitus: Secondary | ICD-10-CM | POA: Insufficient documentation

## 2021-02-21 DIAGNOSIS — F17210 Nicotine dependence, cigarettes, uncomplicated: Secondary | ICD-10-CM | POA: Insufficient documentation

## 2021-02-21 DIAGNOSIS — I10 Essential (primary) hypertension: Secondary | ICD-10-CM | POA: Diagnosis not present

## 2021-02-21 DIAGNOSIS — Z794 Long term (current) use of insulin: Secondary | ICD-10-CM | POA: Diagnosis not present

## 2021-02-21 DIAGNOSIS — D696 Thrombocytopenia, unspecified: Secondary | ICD-10-CM | POA: Diagnosis not present

## 2021-02-21 DIAGNOSIS — Z79899 Other long term (current) drug therapy: Secondary | ICD-10-CM | POA: Diagnosis not present

## 2021-02-21 DIAGNOSIS — Z7982 Long term (current) use of aspirin: Secondary | ICD-10-CM | POA: Diagnosis not present

## 2021-02-21 DIAGNOSIS — Z803 Family history of malignant neoplasm of breast: Secondary | ICD-10-CM | POA: Diagnosis not present

## 2021-02-21 DIAGNOSIS — D649 Anemia, unspecified: Secondary | ICD-10-CM

## 2021-02-21 LAB — CBC WITH DIFFERENTIAL/PLATELET
Abs Immature Granulocytes: 0.01 10*3/uL (ref 0.00–0.07)
Basophils Absolute: 0 10*3/uL (ref 0.0–0.1)
Basophils Relative: 1 %
Eosinophils Absolute: 0 10*3/uL (ref 0.0–0.5)
Eosinophils Relative: 1 %
HCT: 32.1 % — ABNORMAL LOW (ref 36.0–46.0)
Hemoglobin: 11.1 g/dL — ABNORMAL LOW (ref 12.0–15.0)
Immature Granulocytes: 0 %
Lymphocytes Relative: 38 %
Lymphs Abs: 1.7 10*3/uL (ref 0.7–4.0)
MCH: 33.3 pg (ref 26.0–34.0)
MCHC: 34.6 g/dL (ref 30.0–36.0)
MCV: 96.4 fL (ref 80.0–100.0)
Monocytes Absolute: 0.4 10*3/uL (ref 0.1–1.0)
Monocytes Relative: 10 %
Neutro Abs: 2.2 10*3/uL (ref 1.7–7.7)
Neutrophils Relative %: 50 %
Platelets: 38 10*3/uL — ABNORMAL LOW (ref 150–400)
RBC: 3.33 MIL/uL — ABNORMAL LOW (ref 3.87–5.11)
RDW: 13.4 % (ref 11.5–15.5)
WBC: 4.4 10*3/uL (ref 4.0–10.5)
nRBC: 0 % (ref 0.0–0.2)

## 2021-02-22 NOTE — Progress Notes (Signed)
Patient on schedule for BMB 02/26/2021. Called and spoke with patient on phone with pre procedure instructions given. Made aware to be here@ 0730, NPO after MN prior to procedure, hold am dose of Humolog,1/2 dose pm prior to procedure, and driver for discharge post procedure/recovery. Stated understanding.

## 2021-02-26 ENCOUNTER — Other Ambulatory Visit: Payer: Self-pay

## 2021-02-26 ENCOUNTER — Ambulatory Visit
Admission: RE | Admit: 2021-02-26 | Discharge: 2021-02-26 | Disposition: A | Discharge status: Home / Self Care | Payer: Managed Care, Other (non HMO) | Source: Ambulatory Visit | Attending: Oncology | Admitting: Oncology

## 2021-02-26 DIAGNOSIS — D696 Thrombocytopenia, unspecified: Secondary | ICD-10-CM

## 2021-02-26 DIAGNOSIS — D6949 Other primary thrombocytopenia: Secondary | ICD-10-CM | POA: Diagnosis not present

## 2021-02-26 DIAGNOSIS — Z803 Family history of malignant neoplasm of breast: Secondary | ICD-10-CM | POA: Insufficient documentation

## 2021-02-26 DIAGNOSIS — E119 Type 2 diabetes mellitus without complications: Secondary | ICD-10-CM | POA: Insufficient documentation

## 2021-02-26 DIAGNOSIS — E785 Hyperlipidemia, unspecified: Secondary | ICD-10-CM | POA: Diagnosis not present

## 2021-02-26 DIAGNOSIS — Z79890 Hormone replacement therapy (postmenopausal): Secondary | ICD-10-CM | POA: Diagnosis not present

## 2021-02-26 DIAGNOSIS — Z7982 Long term (current) use of aspirin: Secondary | ICD-10-CM | POA: Insufficient documentation

## 2021-02-26 DIAGNOSIS — Z794 Long term (current) use of insulin: Secondary | ICD-10-CM | POA: Insufficient documentation

## 2021-02-26 DIAGNOSIS — I10 Essential (primary) hypertension: Secondary | ICD-10-CM | POA: Diagnosis not present

## 2021-02-26 DIAGNOSIS — R569 Unspecified convulsions: Secondary | ICD-10-CM | POA: Diagnosis not present

## 2021-02-26 DIAGNOSIS — D7589 Other specified diseases of blood and blood-forming organs: Secondary | ICD-10-CM | POA: Diagnosis not present

## 2021-02-26 DIAGNOSIS — Z7289 Other problems related to lifestyle: Secondary | ICD-10-CM | POA: Diagnosis not present

## 2021-02-26 DIAGNOSIS — Z79899 Other long term (current) drug therapy: Secondary | ICD-10-CM | POA: Insufficient documentation

## 2021-02-26 DIAGNOSIS — D649 Anemia, unspecified: Secondary | ICD-10-CM | POA: Insufficient documentation

## 2021-02-26 DIAGNOSIS — F17210 Nicotine dependence, cigarettes, uncomplicated: Secondary | ICD-10-CM | POA: Diagnosis not present

## 2021-02-26 LAB — CBC WITH DIFFERENTIAL/PLATELET
Abs Immature Granulocytes: 0.01 10*3/uL (ref 0.00–0.07)
Basophils Absolute: 0 10*3/uL (ref 0.0–0.1)
Basophils Relative: 1 %
Eosinophils Absolute: 0 10*3/uL (ref 0.0–0.5)
Eosinophils Relative: 1 %
HCT: 32.7 % — ABNORMAL LOW (ref 36.0–46.0)
Hemoglobin: 11.5 g/dL — ABNORMAL LOW (ref 12.0–15.0)
Immature Granulocytes: 0 %
Lymphocytes Relative: 41 %
Lymphs Abs: 1.7 10*3/uL (ref 0.7–4.0)
MCH: 33.6 pg (ref 26.0–34.0)
MCHC: 35.2 g/dL (ref 30.0–36.0)
MCV: 95.6 fL (ref 80.0–100.0)
Monocytes Absolute: 0.6 10*3/uL (ref 0.1–1.0)
Monocytes Relative: 14 %
Neutro Abs: 1.9 10*3/uL (ref 1.7–7.7)
Neutrophils Relative %: 43 %
Platelets: 48 10*3/uL — ABNORMAL LOW (ref 150–400)
RBC: 3.42 MIL/uL — ABNORMAL LOW (ref 3.87–5.11)
RDW: 15 % (ref 11.5–15.5)
WBC: 4.2 10*3/uL (ref 4.0–10.5)
nRBC: 0 % (ref 0.0–0.2)

## 2021-02-26 LAB — GLUCOSE, CAPILLARY
Glucose-Capillary: 230 mg/dL — ABNORMAL HIGH (ref 70–99)
Glucose-Capillary: 230 mg/dL — ABNORMAL HIGH (ref 70–99)

## 2021-02-26 MED ORDER — MIDAZOLAM HCL 2 MG/2ML IJ SOLN
INTRAMUSCULAR | Status: AC | PRN
  Administered 2021-02-26 (×2): 1 mg via INTRAVENOUS

## 2021-02-26 MED ORDER — FENTANYL CITRATE (PF) 100 MCG/2ML IJ SOLN
INTRAMUSCULAR | Status: AC | PRN
  Administered 2021-02-26 (×2): 50 ug via INTRAVENOUS

## 2021-02-26 MED ORDER — HEPARIN SOD (PORK) LOCK FLUSH 100 UNIT/ML IV SOLN
INTRAVENOUS | Status: AC
  Filled 2021-02-26: qty 5

## 2021-02-26 MED ORDER — FENTANYL CITRATE (PF) 100 MCG/2ML IJ SOLN
INTRAMUSCULAR | Status: AC
  Filled 2021-02-26: qty 2

## 2021-02-26 MED ORDER — MIDAZOLAM HCL 2 MG/2ML IJ SOLN
INTRAMUSCULAR | Status: AC
  Filled 2021-02-26: qty 2

## 2021-02-26 MED ORDER — SODIUM CHLORIDE 0.9 % IV SOLN: INTRAVENOUS | Status: DC

## 2021-02-26 NOTE — Progress Notes (Signed)
Patient clinically stable post BMB per Dr Pascal Lux, tolerated well. Denies complaints at this time. Received Versed 2 mg along with Fentanyl 100 mcg IV for procedure. Vitals stable pre and post procedure. Report given to [facility] post procedure/specials.

## 2021-02-26 NOTE — Procedures (Signed)
Pre-procedure Diagnosis: Thrombocytopenia Post-procedure Diagnosis: Same  Technically successful CT guided bone marrow aspiration and biopsy of left iliac crest.   Complications: None Immediate  EBL: None  Signed: Sandi Mariscal Pager: [phone] 02/26/2021, 9:46 AM

## 2021-02-26 NOTE — H&P (Signed)
Chief Complaint: Thrombocytopenia. Request is for bone marrow biopsy.  Referring Physician(s): Yu,Zhou  Supervising Physician: Sandi Mariscal  Patient Status: [facility] - Out-pt  History of Present Illness: Abigail Rice is a 55 y.o. female  outpatient. History of seizures, DM, HTN. HLD and alcohol use. Found to have thrombocytopenia that responded to steroids but remains persistent. Team is requesting a bone marrow biopsy for further evaluation of thrombocytopenia and  evaluation for possible lymphoma.   Currently without any significant complaints. Patient alert and laying in bed, anxious and tearful. Denies any fevers, headache, chest pain, SOB, cough, abdominal pain, nausea, vomiting or bleeding. Ms Morici endorses fatigue but states that she has been working a lot of overtime.Return precautions and treatment recommendations and follow-up discussed with the patient who is agreeable with the plan.     Past Medical History:  Diagnosis Date  . Bipolar disorder ([place])   . Depression   . Diabetes mellitus without complication ([place])   . History of seizure 2020   history of 1 seizure  . Hyperlipidemia   . Hypertension     Past Surgical History:  Procedure Laterality Date  . CESAREAN SECTION    . COLONOSCOPY WITH PROPOFOL N/A 03/29/2016   Procedure: COLONOSCOPY WITH PROPOFOL;  Surgeon: Josefine Class, MD;  Location: [facility] ENDOSCOPY;  Service: Endoscopy;  Laterality: N/A;  . TUBAL LIGATION      Allergies: Ace inhibitors, Ciprofloxacin, Lisinopril, Risperdal [risperidone], and Vytorin [ezetimibe-simvastatin]  Medications: Prior to Admission medications   Medication Sig Start Date End Date Taking? Authorizing Provider  amLODipine (NORVASC) 5 MG tablet Take 1 tablet (5 mg total) by mouth daily. 01/09/21  Yes Luiz Ochoa, NP  aspirin EC 81 MG tablet Take 81 mg by mouth daily.   Yes [provider]  glucose blood (ONETOUCH ULTRA) test strip One touch ultra blue  SUGAR  TESTING 1 TIME DAILY. DX e11.65 12/27/20  Yes Lavera Guise, MD  hyoscyamine (LEVSIN) 0.125 MG tablet Take 1 tablet (0.125 mg total) by mouth every 6 (six) hours as needed for cramping. 09/15/20  Yes Boscia, Greer Ee, NP  Insulin Lispro Prot & Lispro (HUMALOG 75/25 MIX) (75-25) 100 UNIT/ML Kwikpen Inject 20 Units into the skin 2 (two) times daily with a meal. 10/25/20  Yes Lavera Guise, MD  Insulin Pen Needle (PEN NEEDLES) 32G X 4 MM MISC 1 each by Does not apply route daily. 09/22/20  Yes Lavera Guise, MD  levETIRAcetam (KEPPRA) 500 MG tablet Take 1 tablet (500 mg total) by mouth 2 (two) times daily. Patient taking differently: Take 500 mg by mouth daily. Taking 1/2 tab QD 10/09/20  Yes Ronnell Freshwater, NP  levothyroxine (SYNTHROID) 50 MCG tablet Take 1 tablet (50 mcg total) by mouth daily before breakfast. 09/28/20  Yes Lavera Guise, MD  losartan (COZAAR) 50 MG tablet Take 1 tablet (50 mg total) by mouth daily. 10/20/20  Yes Luiz Ochoa, NP  POTASSIUM PO Take by mouth.   Yes [provider]  rOPINIRole (REQUIP) 0.25 MG tablet Take 1 to 2 tablets po QHS prn 04/10/20  Yes Boscia, Heather E, NP  dexamethasone (DECADRON) 4 MG tablet Take 10 tablets (40 mg total) by mouth daily. Patient not taking: Reported on 02/14/2021 01/31/21   Earlie Server, MD  furosemide (LASIX) 20 MG tablet TAKE 1 TABLET BY MOUTH EVERY DAY FOR 3 DAYS FOR SWELLING AND THEN AS NEEDED Patient not taking: Reported on 02/14/2021 01/11/21   Clayborn Bigness  M, MD  insulin lispro (HUMALOG) 100 UNIT/ML KiwkPen Inject into the skin. Use at lunch with sliding scale may use up to 10 units [facility] Patient not taking: Reported on 02/14/2021    [provider]     Family History  Problem Relation Age of Onset  . Diabetes Mother   . Hypertension Mother   . Breast cancer Sister     Social History   Socioeconomic History  . Marital status: Married    Spouse name: Not on file  . Number of children: Not on file  . Years of  education: Not on file  . Highest education level: Not on file  Occupational History  . Not on file  Tobacco Use  . Smoking status: Current Every Day Smoker    Packs/day: 0.50    Years: 25.00    Pack years: 12.50    Types: Cigarettes  . Smokeless tobacco: Never Used  Vaping Use  . Vaping Use: Never used  Substance and Sexual Activity  . Alcohol use: Yes    Alcohol/week: 2.0 standard drinks    Types: 2 Cans of beer per week    Comment: daily   . Drug use: No  . Sexual activity: Not on file  Other Topics Concern  . Not on file  Social History Narrative  . Not on file   Social Determinants of Health   Financial Resource Strain: Not on file  Food Insecurity: Not on file  Transportation Needs: Not on file  Physical Activity: Not on file  Stress: Not on file  Social Connections: Not on file    Review of Systems: A 12 point ROS discussed and pertinent positives are indicated in the HPI above.  All other systems are negative.  Review of Systems  Constitutional: Positive for fatigue. Negative for fever.  HENT: Negative for congestion.   Respiratory: Negative for cough and shortness of breath.   Gastrointestinal: Negative for abdominal pain, diarrhea, nausea and vomiting.    Vital Signs: BP (!) 142/79   Pulse 98   Temp 98.9 F (37.2 C) (Oral)   Resp 20   Ht _0  (1.651 m)   Wt 178 lb (80.7 kg)   LMP 11/07/2015 (Approximate)   SpO2 100%   BMI 29.62 kg/m   Physical Exam Vitals and nursing note reviewed.  Constitutional:      Appearance: She is well-developed.  HENT:     Head: Normocephalic and atraumatic.  Eyes:     Conjunctiva/sclera: Conjunctivae normal.  Cardiovascular:     Rate and Rhythm: Normal rate and regular rhythm.     Heart sounds: Normal heart sounds.  Pulmonary:     Effort: Pulmonary effort is normal.     Breath sounds: Normal breath sounds.  Musculoskeletal:        General: Normal range of motion.     Cervical back: Normal range of motion.   Skin:    General: Skin is warm.  Neurological:     Mental Status: She is alert and oriented to person, place, and time.     Imaging: No results found.  Labs:  CBC: Recent Labs    02/07/21 1048 02/14/21 1021 02/21/21 1125 02/26/21 0723  WBC 7.7 6.3 4.4 4.2  HGB 11.2* 11.4* 11.1* 11.5*  HCT 32.9* 33.3* 32.1* 32.7*  PLT 80* 53* 38* 48*    COAGS: No results for input(s): INR, APTT in the last 8760 hours.  BMP: Recent Labs    08/22/20 1349 11/01/20 0809 01/17/21  1120  NA 135 136 134*  K 4.2 4.3 3.5  CL 102 102 102  CO2 _0 GLUCOSE 150* 174* 110*  BUN _1 CALCIUM 8.4* 8.8 8.5*  CREATININE 1.03* 1.12* 1.02*  GFRNONAA 62 56* >60  GFRAA 71 64  --     LIVER FUNCTION TESTS: Recent Labs    08/22/20 1349 11/01/20 0809 01/17/21 1120  BILITOT 0.6 0.5 1.8*  AST 69* 61* 106*  ALT 57* 57* 90*  ALKPHOS 205* 287* 207*  PROT 7.1 7.4 8.0  ALBUMIN 3.4* 3.5* 3.2*   Assessment and Plan:  55 y.o. female outpatient. History of seizures, DM, HTN. HLD and alcohol use. Found to have thrombocytopenia that responded to steroids but remains persistent. Team is requesting a bone marrow biopsy for further evaluation of thrombocytopenia and  evaluation for possible lymphoma.    Platelets 48. All other labs and medications are within acceptable parameters.  Allergies include cipro. Patient has been NPO since midnight.  Risks and benefits of bone marrow biopsy was discussed with the patient and/or patient's family including, but not limited to bleeding, infection, damage to adjacent structures or low yield requiring additional tests.  All of the questions were answered and there is agreement to proceed.  Consent signed and in chart.    Thank you for this interesting consult.  I greatly enjoyed meeting Jalaysha K Broman and look forward to participating in their care.  A copy of this report was sent to the requesting provider on this date.  Electronically  Signed: Jacqualine Mau, NP 02/26/2021, 8:22 AM   I spent a total of  30 Minutes   in face to face in clinical consultation, greater than 50% of which was counseling/coordinating care for bone marrow biopsy

## 2021-03-05 ENCOUNTER — Ambulatory Visit: Payer: Managed Care, Other (non HMO) | Admitting: Oncology

## 2021-03-06 ENCOUNTER — Encounter (HOSPITAL_COMMUNITY): Payer: Self-pay | Admitting: Oncology

## 2021-03-07 ENCOUNTER — Encounter: Payer: Self-pay | Admitting: Oncology

## 2021-03-07 ENCOUNTER — Inpatient Hospital Stay (HOSPITAL_BASED_OUTPATIENT_CLINIC_OR_DEPARTMENT_OTHER): Payer: Managed Care, Other (non HMO) | Admitting: Oncology

## 2021-03-07 VITALS — BP 115/72 | HR 91 | Temp 98.0°F | Resp 18 | Wt 180.6 lb

## 2021-03-07 DIAGNOSIS — D649 Anemia, unspecified: Secondary | ICD-10-CM | POA: Diagnosis not present

## 2021-03-07 DIAGNOSIS — D696 Thrombocytopenia, unspecified: Secondary | ICD-10-CM

## 2021-03-07 LAB — SURGICAL PATHOLOGY

## 2021-03-07 NOTE — Progress Notes (Signed)
Pt here for follow up. No new concerns voiced.   

## 2021-03-07 NOTE — Progress Notes (Signed)
Hematology/Oncology followup note [facility] Telephone:([phone] [phone]   Patient Care Team: Lavera Guise, MD as PCP - General (Internal Medicine)  REFERRING PROVIDER: Lavera Guise, MD  CHIEF COMPLAINTS/REASON FOR VISIT:   thrombocytopenia  HISTORY OF PRESENTING ILLNESS:  Abigail Rice is a 55 y.o. female who was seen in consultation at the request of Lavera Guise, MD for evaluation of thrombocytopenia   Reviewed patient's labs done previously.  Labs showed decreased platelet counts at 43,000, wbc 4.1 hemoglobin 10.7 Reviewed patient's previous labs. Thrombocytopenia is chronic chronic onset , since at least 2019.  Platelet count in 2019 was 1 43,000.  Starting August 2020, platelet count has been in the range of 50-60,000 No aggravating or elevated factors.  Associated symptoms or signs:  Denies weight loss, fever, chills, fatigue, night sweats.  Denies hematochezia, hematuria, hematemesis, epistaxis, black tarry stool.  easy bruising.   Context:  History of hepatitis or HIV infection, denies  Patient has a history of heavy alcohol use.  She reports that she has cut down her alcohol intake since last year and currently she drinks 3-4 beer per day.  With a history of seizure and currently on Keppra.  #Thrombocytopenia increased to 80,000 after a course of dexamethasone 40 mg daily for 4 days. Platelet count further decreased to 38,000 a few weeks later.  INTERVAL HISTORY Abigail Rice is a 55 y.o. female who has above history reviewed by me today presents for follow up visit for management of thrombocytopenia. Problems and complaints are listed below: Status post bone marrow biopsy.  Patient presents to discuss marrow results. She has decreased alcohol consumption recently.  No new complaints.  No bleeding.     Review of Systems  Constitutional: Negative for appetite change, chills, fatigue and fever.  HENT:   Negative for  hearing loss and voice change.   Eyes: Negative for eye problems.  Respiratory: Negative for chest tightness and cough.   Cardiovascular: Negative for chest pain.  Gastrointestinal: Negative for abdominal distention, abdominal pain and blood in stool.  Endocrine: Negative for hot flashes.  Genitourinary: Negative for difficulty urinating and frequency.   Musculoskeletal: Negative for arthralgias.  Skin: Negative for itching and rash.  Neurological: Negative for extremity weakness.  Hematological: Negative for adenopathy.  Psychiatric/Behavioral: Negative for confusion.    MEDICAL HISTORY:  Past Medical History:  Diagnosis Date  . Bipolar disorder ([place])   . Depression   . Diabetes mellitus without complication ([place])   . History of seizure 2020   history of 1 seizure  . Hyperlipidemia   . Hypertension     SURGICAL HISTORY: Past Surgical History:  Procedure Laterality Date  . CESAREAN SECTION    . COLONOSCOPY WITH PROPOFOL N/A 03/29/2016   Procedure: COLONOSCOPY WITH PROPOFOL;  Surgeon: Josefine Class, MD;  Location: [facility] ENDOSCOPY;  Service: Endoscopy;  Laterality: N/A;  . TUBAL LIGATION      SOCIAL HISTORY: Social History   Socioeconomic History  . Marital status: Married    Spouse name: Not on file  . Number of children: Not on file  . Years of education: Not on file  . Highest education level: Not on file  Occupational History  . Not on file  Tobacco Use  . Smoking status: Current Every Day Smoker    Packs/day: 0.50    Years: 25.00    Pack years: 12.50    Types: Cigarettes  . Smokeless tobacco: Never Used  Vaping Use  .  Vaping Use: Never used  Substance and Sexual Activity  . Alcohol use: Yes    Alcohol/week: 2.0 standard drinks    Types: 2 Cans of beer per week    Comment: daily   . Drug use: No  . Sexual activity: Not on file  Other Topics Concern  . Not on file  Social History Narrative  . Not on file   Social Determinants of Health    Financial Resource Strain: Not on file  Food Insecurity: Not on file  Transportation Needs: Not on file  Physical Activity: Not on file  Stress: Not on file  Social Connections: Not on file  Intimate Partner Violence: Not on file    FAMILY HISTORY: Family History  Problem Relation Age of Onset  . Diabetes Mother   . Hypertension Mother   . Breast cancer Sister     ALLERGIES:  is allergic to ace inhibitors, ciprofloxacin, lisinopril, risperdal [risperidone], and vytorin [ezetimibe-simvastatin].  MEDICATIONS:  Current Outpatient Medications  Medication Sig Dispense Refill  . amLODipine (NORVASC) 5 MG tablet Take 1 tablet (5 mg total) by mouth daily. 90 tablet 1  . aspirin EC 81 MG tablet Take 81 mg by mouth daily.    Marland Kitchen glucose blood (ONETOUCH ULTRA) test strip One touch ultra blue  SUGAR TESTING 1 TIME DAILY. DX e11.65 150 strip 1  . hyoscyamine (LEVSIN) 0.125 MG tablet Take 1 tablet (0.125 mg total) by mouth every 6 (six) hours as needed for cramping. 120 tablet 2  . Insulin Lispro Prot & Lispro (HUMALOG 75/25 MIX) (75-25) 100 UNIT/ML Kwikpen Inject 20 Units into the skin 2 (two) times daily with a meal. 15 mL 11  . Insulin Pen Needle (PEN NEEDLES) 32G X 4 MM MISC 1 each by Does not apply route daily. 100 each 3  . levETIRAcetam (KEPPRA) 500 MG tablet Take 1 tablet (500 mg total) by mouth 2 (two) times daily. (Patient taking differently: Take 500 mg by mouth daily. Taking 1/2 tab QD) 180 tablet 1  . levothyroxine (SYNTHROID) 50 MCG tablet Take 1 tablet (50 mcg total) by mouth daily before breakfast. 90 tablet 1  . losartan (COZAAR) 50 MG tablet Take 1 tablet (50 mg total) by mouth daily. 90 tablet 3  . POTASSIUM PO Take by mouth.    Marland Kitchen rOPINIRole (REQUIP) 0.25 MG tablet Take 1 to 2 tablets po QHS prn 120 tablet 3   No current facility-administered medications for this visit.     PHYSICAL EXAMINATION: ECOG PERFORMANCE STATUS: 0 - Asymptomatic Vitals:   03/07/21 1041  BP:  115/72  Pulse: 91  Resp: 18  Temp: 98 F (36.7 C)   Filed Weights   03/07/21 1041  Weight: 180 lb 9.6 oz (81.9 kg)    Physical Exam Constitutional:      General: She is not in acute distress. HENT:     Head: Normocephalic and atraumatic.  Eyes:     General: No scleral icterus. Cardiovascular:     Rate and Rhythm: Normal rate and regular rhythm.     Heart sounds: Normal heart sounds.  Pulmonary:     Effort: Pulmonary effort is normal. No respiratory distress.     Breath sounds: No wheezing.  Abdominal:     General: Bowel sounds are normal. There is no distension.     Palpations: Abdomen is soft.  Musculoskeletal:        General: No deformity. Normal range of motion.     Cervical back: Normal range of  motion and neck supple.  Skin:    General: Skin is warm and dry.     Findings: No erythema or rash.  Neurological:     Mental Status: She is alert and oriented to person, place, and time. Mental status is at baseline.     Cranial Nerves: No cranial nerve deficit.     Coordination: Coordination normal.  Psychiatric:        Mood and Affect: Mood normal.      LABORATORY DATA:  I have reviewed the data as listed Lab Results  Component Value Date   WBC 4.2 02/26/2021   HGB 11.5 (L) 02/26/2021   HCT 32.7 (L) 02/26/2021   MCV 95.6 02/26/2021   PLT 48 (L) 02/26/2021   Recent Labs    08/22/20 1349 11/01/20 0809 01/17/21 1120  NA 135 136 134*  K 4.2 4.3 3.5  CL 102 102 102  CO2 22 22 24   GLUCOSE 150* 174* 110*  BUN 14 18 16   CREATININE 1.03* 1.12* 1.02*  CALCIUM 8.4* 8.8 8.5*  GFRNONAA 62 56* >60  GFRAA 71 64  --   PROT 7.1 7.4 8.0  ALBUMIN 3.4* 3.5* 3.2*  AST 69* 61* 106*  ALT 57* 57* 90*  ALKPHOS 205* 287* 207*  BILITOT 0.6 0.5 1.8*   Iron/TIBC/Ferritin/ %Sat    Component Value Date/Time   IRON 156 01/17/2021 1120   TIBC 206 (L) 01/17/2021 1120   FERRITIN 595 (H) 01/17/2021 1120   IRONPCTSAT 76 (H) 01/17/2021 1120      RADIOGRAPHIC STUDIES: I  have personally reviewed the radiological images as listed and agreed with the findings in the report.  CT BONE MARROW BIOPSY & ASPIRATION  Result Date: 02/26/2021 INDICATION: Thrombocytopenia of uncertain etiology. Please perform CT-guided bone marrow biopsy for tissue diagnostic purposes. EXAM: CT-GUIDED BONE MARROW BIOPSY AND ASPIRATION MEDICATIONS: None ANESTHESIA/SEDATION: Fentanyl 100 mcg IV; Versed 2 mg IV Sedation Time: 14 Minutes; The patient was continuously monitored during the procedure by the interventional radiology nurse under my direct supervision. COMPLICATIONS: None immediate. PROCEDURE: Informed consent was obtained from the patient following an explanation of the procedure, risks, benefits and alternatives. The patient understands, agrees and consents for the procedure. All questions were addressed. A time out was performed prior to the initiation of the procedure. The patient was positioned prone and non-contrast localization CT was performed of the pelvis to demonstrate the iliac marrow spaces. The operative site was prepped and draped in the usual sterile fashion. Under sterile conditions and local anesthesia, a 22 gauge spinal needle was utilized for procedural planning. Next, an 11 gauge coaxial bone biopsy needle was advanced into the left iliac marrow space. Needle position was confirmed with CT imaging. Initially, a bone marrow aspiration was performed. Next, a bone marrow biopsy was obtained with the 11 gauge outer bone marrow device. The 11 gauge coaxial bone biopsy needle was re-advanced into a slightly different location within the left iliac marrow space, positioning was confirmed with CT imaging and an additional bone marrow biopsy was obtained. The needle was removed and superficial hemostasis was obtained with manual compression. A dressing was applied. The patient tolerated the procedure well without immediate post procedural complication. IMPRESSION: Successful CT guided left  iliac bone marrow aspiration and core biopsy. Electronically Signed   By: Sandi Mariscal M.D.   On: 02/26/2021 09:56    12/13/2020, ultrasound abdomen limited-images are not available to me Impression, mildly increased echogenicity of the liver.  Likely fatty infiltration.  Liver unremarkable contour, echotexture.  Normal size of liver and spleen.  There are innumerable tiny scattered calcifications throughout the spleen.  ASSESSMENT & PLAN:  1. Thrombocytopenia ([place])   2. Normocytic anemia    #Thrombocytopenia possible due to peripheral destruction, chronic liver disease/alcohol consumption. 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  Findings are favored to be reactive in nature. FISH results is normal. 02/26/2021 CBC showed a platelet count of 48,000. Recommend observation since the platelet count is above 30,000 in the no active bleeding event.  Patient may have future elective dental procedure and asked patient to update me a few days prior to his procedure.  I recommend CBC prior to his elective dental procedure.  Abnormal iron panel She has increased ferritin level of 595, increased saturation of 76.  Hemochromatosis screening testing was negative.  Abnormal iron testing may be related to chronic alcohol use and liver injury.  Alcohol cessation.  #Follow-up 3 months  Orders Placed This Encounter  Procedures  . CBC with Differential/Platelet    Standing Status:   Future    Standing Expiration Date:   03/07/2022  . Comprehensive metabolic panel    Standing Status:   Future    Standing Expiration Date:   03/07/2022    All questions were answered. The patient knows to call the clinic with any problems questions or concerns.  Cc Lavera Guise, MD   Earlie Server, MD, PhD 03/07/2021

## 2021-03-08 ENCOUNTER — Telehealth: Payer: Self-pay | Admitting: *Deleted

## 2021-03-08 DIAGNOSIS — D696 Thrombocytopenia, unspecified: Secondary | ICD-10-CM

## 2021-03-08 NOTE — Telephone Encounter (Signed)
Cbc on 3/28

## 2021-03-08 NOTE — Telephone Encounter (Signed)
Dr. Tasia Catchings please advise on lab date on 3/25

## 2021-03-08 NOTE — Telephone Encounter (Signed)
Patient called stating that she is to have her Abigail Rice wanted to know this so that labs can be ordered prior to that appointment.

## 2021-03-08 NOTE — Telephone Encounter (Signed)
ok 

## 2021-03-08 NOTE — Telephone Encounter (Signed)
FYI... Labs on 03/19/21 per MD  Labs were sched on 3/25 date per pt request due to her work sched..  She stated that she couldn't come on 3/28,3/29 or 03/21/21. Is 3/25 ok?  Please Advise

## 2021-03-08 NOTE — Telephone Encounter (Signed)
Please schedule patient for lab and inform her of appt details.

## 2021-03-08 NOTE — Telephone Encounter (Signed)
What labs would you like drawn and when should they be scheduled?

## 2021-03-16 ENCOUNTER — Inpatient Hospital Stay: Payer: Managed Care, Other (non HMO)

## 2021-03-16 DIAGNOSIS — D696 Thrombocytopenia, unspecified: Secondary | ICD-10-CM | POA: Diagnosis not present

## 2021-03-16 LAB — CBC WITH DIFFERENTIAL/PLATELET
Abs Immature Granulocytes: 0.01 10*3/uL (ref 0.00–0.07)
Basophils Absolute: 0 10*3/uL (ref 0.0–0.1)
Basophils Relative: 0 %
Eosinophils Absolute: 0 10*3/uL (ref 0.0–0.5)
Eosinophils Relative: 1 %
HCT: 32 % — ABNORMAL LOW (ref 36.0–46.0)
Hemoglobin: 10.6 g/dL — ABNORMAL LOW (ref 12.0–15.0)
Immature Granulocytes: 0 %
Lymphocytes Relative: 50 %
Lymphs Abs: 2.5 10*3/uL (ref 0.7–4.0)
MCH: 33.7 pg (ref 26.0–34.0)
MCHC: 33.1 g/dL (ref 30.0–36.0)
MCV: 101.6 fL — ABNORMAL HIGH (ref 80.0–100.0)
Monocytes Absolute: 0.4 10*3/uL (ref 0.1–1.0)
Monocytes Relative: 8 %
Neutro Abs: 2.1 10*3/uL (ref 1.7–7.7)
Neutrophils Relative %: 41 %
Platelets: 55 10*3/uL — ABNORMAL LOW (ref 150–400)
RBC: 3.15 MIL/uL — ABNORMAL LOW (ref 3.87–5.11)
RDW: 14.7 % (ref 11.5–15.5)
WBC: 5 10*3/uL (ref 4.0–10.5)
nRBC: 0 % (ref 0.0–0.2)

## 2021-03-20 ENCOUNTER — Other Ambulatory Visit: Payer: Self-pay | Admitting: Nurse Practitioner

## 2021-03-20 ENCOUNTER — Other Ambulatory Visit: Payer: Self-pay | Admitting: Internal Medicine

## 2021-03-20 DIAGNOSIS — E039 Hypothyroidism, unspecified: Secondary | ICD-10-CM

## 2021-03-20 DIAGNOSIS — R569 Unspecified convulsions: Secondary | ICD-10-CM

## 2021-03-28 ENCOUNTER — Other Ambulatory Visit: Payer: Self-pay | Admitting: Internal Medicine

## 2021-03-29 ENCOUNTER — Telehealth: Payer: Self-pay

## 2021-03-29 NOTE — Telephone Encounter (Signed)
03/29/2021- spoke with patient told her the recommedation and she stated that she has already had the dental procedure, with no complications. Sjc

## 2021-03-29 NOTE — Telephone Encounter (Signed)
-----   Message from Earlie Server, MD sent at 03/29/2021 10:21 AM EDT ----- Regarding: RE: LAB RESULTS Contact: [phone] Please let her know that platelet count is better, 55000, ok for dental procedure ----- Message ----- From: Evelina Dun, RN Sent: 03/29/2021   9:26 AM EDT To: Earlie Server, MD, Kern Alberta, CMA Subject: RE: LAB RESULTS                                 ----- Message ----- From: Secundino Ginger Sent: 03/29/2021   9:01 AM EDT To: Evelina Dun, RN, Vanice Sarah, CMA Subject: LAB RESULTS                                    LAB WERE DRAWN ON MARCH 25 AND SHE NEVER GOT RESULTS. CAN SOMEONE CALL HER.

## 2021-04-05 ENCOUNTER — Encounter: Payer: Self-pay | Admitting: Physician Assistant

## 2021-04-05 ENCOUNTER — Ambulatory Visit (INDEPENDENT_AMBULATORY_CARE_PROVIDER_SITE_OTHER): Payer: Managed Care, Other (non HMO) | Admitting: Physician Assistant

## 2021-04-05 VITALS — BP 124/72 | HR 95 | Temp 97.6°F | Resp 16 | Ht 65.0 in | Wt 182.6 lb

## 2021-04-05 DIAGNOSIS — E119 Type 2 diabetes mellitus without complications: Secondary | ICD-10-CM | POA: Diagnosis not present

## 2021-04-05 DIAGNOSIS — I10 Essential (primary) hypertension: Secondary | ICD-10-CM

## 2021-04-05 DIAGNOSIS — R5383 Other fatigue: Secondary | ICD-10-CM | POA: Diagnosis not present

## 2021-04-05 DIAGNOSIS — D696 Thrombocytopenia, unspecified: Secondary | ICD-10-CM

## 2021-04-05 DIAGNOSIS — Z794 Long term (current) use of insulin: Secondary | ICD-10-CM

## 2021-04-05 DIAGNOSIS — R569 Unspecified convulsions: Secondary | ICD-10-CM

## 2021-04-05 DIAGNOSIS — E039 Hypothyroidism, unspecified: Secondary | ICD-10-CM

## 2021-04-05 LAB — POCT GLYCOSYLATED HEMOGLOBIN (HGB A1C): Hemoglobin A1C: 5.3 % (ref 4.0–5.6)

## 2021-04-05 MED ORDER — AMLODIPINE BESYLATE 5 MG PO TABS
5.0000 mg | ORAL_TABLET | Freq: Every day | ORAL | 1 refills | Status: DC
Start: 2021-04-05 — End: 2021-09-10

## 2021-04-05 NOTE — Progress Notes (Signed)
[facility] [place], [place] [address]  Internal MEDICINE  Office Visit Note  Patient Name: Abigail Rice  [id number]  [id number]  Date of Service: 04/08/2021  Chief Complaint  Patient presents with  . Follow-up  . Hyperlipidemia  . Hypertension  . Depression  . Diabetes    HPI Pt is here for f/u. She has no complaints today. -Her BP is well controlled in office. At home 120s/70s. -Current alcohol intake is 3 lite beers per day, this is an improvement--working on cutting down.  -She has a chronic hx of thrombocytopenia and is followed by hematology. -Checks BG at home occasionally and usually 130 fasting. Takes 20units of insulin BID.   -Takes 1 tab of requip for RLS -Does mention she will get some cramping in legs and hands at times but comes and goes, takes OTC supplements and hycosamine. Thinks she may have some carpal tunnel from work--recommend trying night splints if worsening -On keppra for seizures--1/2 tab  Current Medication: Outpatient Encounter Medications as of 04/05/2021  Medication Sig  . aspirin EC 81 MG tablet Take 81 mg by mouth daily.  . BD PEN NEEDLE NANO 2ND GEN 32G X 4 MM MISC USE AS DIRECTED  . glucose blood (ONETOUCH ULTRA) test strip One touch ultra blue  SUGAR TESTING 1 TIME DAILY. DX e11.65  . hyoscyamine (LEVSIN) 0.125 MG tablet Take 1 tablet (0.125 mg total) by mouth every 6 (six) hours as needed for cramping.  . Insulin Lispro Prot & Lispro (HUMALOG 75/25 MIX) (75-25) 100 UNIT/ML Kwikpen Inject 20 Units into the skin 2 (two) times daily with a meal.  . levETIRAcetam (KEPPRA) 500 MG tablet Take 1 tablet (500 mg total) by mouth 2 (two) times daily. (Patient taking differently: Take 500 mg by mouth daily. Taking 1/2 tab QD)  . levothyroxine (SYNTHROID) 50 MCG tablet TAKE 1 TABLET BY MOUTH DAILY BEFORE BREAKFAST  . losartan (COZAAR) 50 MG tablet Take 1 tablet (50 mg total) by mouth daily.  Marland Kitchen POTASSIUM PO Take by mouth.  Marland Kitchen  rOPINIRole (REQUIP) 0.25 MG tablet Take 1 to 2 tablets po QHS prn  . [DISCONTINUED] amLODipine (NORVASC) 5 MG tablet Take 1 tablet (5 mg total) by mouth daily.  Marland Kitchen amLODipine (NORVASC) 5 MG tablet Take 1 tablet (5 mg total) by mouth daily.   No facility-administered encounter medications on file as of 04/05/2021.    Surgical History: Past Surgical History:  Procedure Laterality Date  . CESAREAN SECTION    . COLONOSCOPY WITH PROPOFOL N/A 03/29/2016   Procedure: COLONOSCOPY WITH PROPOFOL;  Surgeon: Josefine Class, MD;  Location: [facility] ENDOSCOPY;  Service: Endoscopy;  Laterality: N/A;  . TUBAL LIGATION      Medical History: Past Medical History:  Diagnosis Date  . Bipolar disorder ([place])   . Depression   . Diabetes mellitus without complication ([place])   . History of seizure 2020   history of 1 seizure  . Hyperlipidemia   . Hypertension     Family History: Family History  Problem Relation Age of Onset  . Diabetes Mother   . Hypertension Mother   . Breast cancer Sister     Social History   Socioeconomic History  . Marital status: Married    Spouse name: Not on file  . Number of children: Not on file  . Years of education: Not on file  . Highest education level: Not on file  Occupational History  . Not on file  Tobacco Use  .  Smoking status: Current Every Day Smoker    Packs/day: 0.50    Years: 25.00    Pack years: 12.50    Types: Cigarettes  . Smokeless tobacco: Never Used  Vaping Use  . Vaping Use: Never used  Substance and Sexual Activity  . Alcohol use: Yes    Alcohol/week: 2.0 standard drinks    Types: 2 Cans of beer per week    Comment: daily   . Drug use: No  . Sexual activity: Not on file  Other Topics Concern  . Not on file  Social History Narrative  . Not on file   Social Determinants of Health   Financial Resource Strain: Not on file  Food Insecurity: Not on file  Transportation Needs: Not on file  Physical Activity: Not on file  Stress: Not  on file  Social Connections: Not on file  Intimate Partner Violence: Not on file      Review of Systems  Constitutional: Negative for chills, fatigue and unexpected weight change.  HENT: Negative for congestion, postnasal drip, rhinorrhea, sneezing and sore throat.   Eyes: Negative for redness.  Respiratory: Negative for cough, chest tightness and shortness of breath.   Cardiovascular: Negative for chest pain and palpitations.  Gastrointestinal: Negative for abdominal pain, constipation, diarrhea, nausea and vomiting.  Genitourinary: Negative for dysuria and frequency.  Musculoskeletal: Positive for myalgias. Negative for arthralgias, back pain, joint swelling and neck pain.  Skin: Negative for rash.  Neurological: Negative.  Negative for tremors and numbness.  Hematological: Negative for adenopathy. Does not bruise/bleed easily.  Psychiatric/Behavioral: Negative for behavioral problems (Depression), sleep disturbance and suicidal ideas. The patient is not nervous/anxious.     Vital Signs: BP 124/72   Pulse 95   Temp 97.6 F (36.4 C)   Resp 16   Ht 5\' 5"  (1.651 m)   Wt 182 lb 9.6 oz (82.8 kg)   LMP 11/07/2015 (Approximate)   SpO2 98%   BMI 30.39 kg/m    Physical Exam Vitals and nursing note reviewed.  Constitutional:      General: She is not in acute distress.    Appearance: She is well-developed. She is obese. She is not diaphoretic.  HENT:     Head: Normocephalic and atraumatic.     Mouth/Throat:     Pharynx: No oropharyngeal exudate.  Eyes:     Pupils: Pupils are equal, round, and reactive to light.  Neck:     Thyroid: No thyromegaly.     Vascular: No JVD.     Trachea: No tracheal deviation.  Cardiovascular:     Rate and Rhythm: Normal rate and regular rhythm.     Heart sounds: Normal heart sounds. No murmur heard. No friction rub. No gallop.   Pulmonary:     Effort: Pulmonary effort is normal. No respiratory distress.     Breath sounds: No wheezing or  rales.  Chest:     Chest wall: No tenderness.  Abdominal:     General: Bowel sounds are normal.     Palpations: Abdomen is soft.  Musculoskeletal:        General: Normal range of motion.     Cervical back: Normal range of motion and neck supple.  Lymphadenopathy:     Cervical: No cervical adenopathy.  Skin:    General: Skin is warm and dry.  Neurological:     Mental Status: She is alert and oriented to person, place, and time.     Cranial Nerves: No cranial nerve deficit.  Psychiatric:        Behavior: Behavior normal.        Thought Content: Thought content normal.        Judgment: Judgment normal.        Assessment/Plan: 1. Thrombocytopenia ([place]) Followed by hematology  2. Essential hypertension Well controlled. Continue norvasc - amLODipine (NORVASC) 5 MG tablet; Take 1 tablet (5 mg total) by mouth daily.  Dispense: 90 tablet; Refill: 1  3. Type 2 diabetes mellitus without complication, with long-term current use of insulin (HCC) - POCT HgB A1C is 5.3 today. Encouraged to monitor closely for any low BG readings--pt denies any, but if so may be able to decrease insulin due to being well controlled and to avoid any lows.  4. Seizure ([place]) Takes 1/2 tab of Keppra for prevention.  5. Acquired hypothyroidism Continue Synthroid--will recheck labs and adjust as necessary  6. Other fatigue - TSH + free T4 - Lipid Panel With LDL/HDL Ratio - Comprehensive metabolic panel - VITAMIN D 25 Hydroxy (Vit-D Deficiency, Fractures) - Magnesium - CBC With Differential   General Counseling: Tnia verbalizes understanding of the findings of todays visit and agrees with plan of treatment. I have discussed any further diagnostic evaluation that may be needed or ordered today. We also reviewed her medications today. she has been encouraged to call the office with any questions or concerns that should arise related to todays visit.    Orders Placed This Encounter  Procedures  . TSH  + free T4  . Lipid Panel With LDL/HDL Ratio  . Comprehensive metabolic panel  . VITAMIN D 25 Hydroxy (Vit-D Deficiency, Fractures)  . Magnesium  . CBC With Differential  . POCT HgB A1C    Meds ordered this encounter  Medications  . amLODipine (NORVASC) 5 MG tablet    Sig: Take 1 tablet (5 mg total) by mouth daily.    Dispense:  90 tablet    Refill:  1    This patient was seen by Drema Dallas, PA-C in collaboration with Dr. Clayborn Bigness as a part of collaborative care agreement.   Total time spent:30 Minutes Time spent includes review of chart, medications, test results, and follow up plan with the patient.      Dr Lavera Guise Internal medicine

## 2021-05-09 ENCOUNTER — Other Ambulatory Visit: Payer: Self-pay | Admitting: Nurse Practitioner

## 2021-05-09 DIAGNOSIS — R569 Unspecified convulsions: Secondary | ICD-10-CM

## 2021-05-09 DIAGNOSIS — E039 Hypothyroidism, unspecified: Secondary | ICD-10-CM

## 2021-05-09 MED ORDER — LEVOTHYROXINE SODIUM 50 MCG PO TABS: 50.0000 ug | ORAL_TABLET | Freq: Every day | ORAL | 1 refills | Status: DC

## 2021-06-01 ENCOUNTER — Other Ambulatory Visit: Payer: Managed Care, Other (non HMO) | Admitting: Physician Assistant

## 2021-06-06 ENCOUNTER — Inpatient Hospital Stay: Payer: Managed Care, Other (non HMO) | Attending: Oncology

## 2021-06-06 ENCOUNTER — Inpatient Hospital Stay (HOSPITAL_BASED_OUTPATIENT_CLINIC_OR_DEPARTMENT_OTHER): Payer: Managed Care, Other (non HMO) | Admitting: Oncology

## 2021-06-06 ENCOUNTER — Other Ambulatory Visit: Payer: Self-pay

## 2021-06-06 ENCOUNTER — Encounter: Payer: Self-pay | Admitting: Oncology

## 2021-06-06 VITALS — BP 130/90 | Temp 97.5°F | Resp 18 | Wt 181.9 lb

## 2021-06-06 DIAGNOSIS — D696 Thrombocytopenia, unspecified: Secondary | ICD-10-CM

## 2021-06-06 DIAGNOSIS — R7401 Elevation of levels of liver transaminase levels: Secondary | ICD-10-CM | POA: Diagnosis not present

## 2021-06-06 DIAGNOSIS — D649 Anemia, unspecified: Secondary | ICD-10-CM

## 2021-06-06 DIAGNOSIS — I10 Essential (primary) hypertension: Secondary | ICD-10-CM | POA: Insufficient documentation

## 2021-06-06 DIAGNOSIS — Z789 Other specified health status: Secondary | ICD-10-CM

## 2021-06-06 DIAGNOSIS — Z7289 Other problems related to lifestyle: Secondary | ICD-10-CM | POA: Diagnosis not present

## 2021-06-06 DIAGNOSIS — Z794 Long term (current) use of insulin: Secondary | ICD-10-CM | POA: Diagnosis not present

## 2021-06-06 DIAGNOSIS — E119 Type 2 diabetes mellitus without complications: Secondary | ICD-10-CM | POA: Insufficient documentation

## 2021-06-06 DIAGNOSIS — Z7982 Long term (current) use of aspirin: Secondary | ICD-10-CM | POA: Diagnosis not present

## 2021-06-06 DIAGNOSIS — Z79899 Other long term (current) drug therapy: Secondary | ICD-10-CM | POA: Insufficient documentation

## 2021-06-06 DIAGNOSIS — E785 Hyperlipidemia, unspecified: Secondary | ICD-10-CM | POA: Diagnosis not present

## 2021-06-06 LAB — COMPREHENSIVE METABOLIC PANEL
ALT: 60 U/L — ABNORMAL HIGH (ref 0–44)
AST: 67 U/L — ABNORMAL HIGH (ref 15–41)
Albumin: 3.3 g/dL — ABNORMAL LOW (ref 3.5–5.0)
Alkaline Phosphatase: 176 U/L — ABNORMAL HIGH (ref 38–126)
Anion gap: 7 (ref 5–15)
BUN: 15 mg/dL (ref 6–20)
CO2: 25 mmol/L (ref 22–32)
Calcium: 8.3 mg/dL — ABNORMAL LOW (ref 8.9–10.3)
Chloride: 101 mmol/L (ref 98–111)
Creatinine, Ser: 1.05 mg/dL — ABNORMAL HIGH (ref 0.44–1.00)
GFR, Estimated: >60 mL/min (ref >60)
Glucose, Bld: 125 mg/dL — ABNORMAL HIGH (ref 70–99)
Potassium: 4.2 mmol/L (ref 3.5–5.1)
Sodium: 133 mmol/L — ABNORMAL LOW (ref 135–145)
Total Bilirubin: 1.5 mg/dL — ABNORMAL HIGH (ref 0.3–1.2)
Total Protein: 7.4 g/dL (ref 6.5–8.1)

## 2021-06-06 LAB — CBC WITH DIFFERENTIAL/PLATELET
Abs Immature Granulocytes: 0.02 10*3/uL (ref 0.00–0.07)
Basophils Absolute: 0 10*3/uL (ref 0.0–0.1)
Basophils Relative: 1 %
Eosinophils Absolute: 0 10*3/uL (ref 0.0–0.5)
Eosinophils Relative: 1 %
HCT: 33.5 % — ABNORMAL LOW (ref 36.0–46.0)
Hemoglobin: 11.2 g/dL — ABNORMAL LOW (ref 12.0–15.0)
Immature Granulocytes: 1 %
Lymphocytes Relative: 42 %
Lymphs Abs: 1.9 10*3/uL (ref 0.7–4.0)
MCH: 33.3 pg (ref 26.0–34.0)
MCHC: 33.4 g/dL (ref 30.0–36.0)
MCV: 99.7 fL (ref 80.0–100.0)
Monocytes Absolute: 0.6 10*3/uL (ref 0.1–1.0)
Monocytes Relative: 12 %
Neutro Abs: 2 10*3/uL (ref 1.7–7.7)
Neutrophils Relative %: 43 %
Platelets: 40 10*3/uL — ABNORMAL LOW (ref 150–400)
RBC: 3.36 MIL/uL — ABNORMAL LOW (ref 3.87–5.11)
RDW: 13.4 % (ref 11.5–15.5)
WBC: 4.4 10*3/uL (ref 4.0–10.5)
nRBC: 0 % (ref 0.0–0.2)

## 2021-06-06 LAB — IMMATURE PLATELET FRACTION: Immature Platelet Fraction: 13.7 % — ABNORMAL HIGH (ref 1.2–8.6)

## 2021-06-06 NOTE — Progress Notes (Signed)
Pt here for follow up. No new concerns voiced.   

## 2021-06-06 NOTE — Progress Notes (Signed)
Hematology/Oncology followup note [facility] [phone] [phone]   Patient Care Team: Lavera Guise, MD as PCP - General (Internal Medicine)  REFERRING PROVIDER: Lavera Guise, MD  CHIEF COMPLAINTS/REASON FOR VISIT:   thrombocytopenia  HISTORY OF PRESENTING ILLNESS:  Abigail Rice is a 55 y.o. female who was seen in consultation at the request of Lavera Guise, MD for evaluation of thrombocytopenia   Reviewed patient's labs done previously.  Labs showed decreased platelet counts at 43,000, wbc 4.1 hemoglobin 10.7 Reviewed patient's previous labs. Thrombocytopenia is chronic chronic onset , since at least 2019.  Platelet count in 2019 was 1 43,000.  Starting August 2020, platelet count has been in the range of 50-60,000 No aggravating or elevated factors.  Associated symptoms or signs:  Denies weight loss, fever, chills, fatigue, night sweats.  Denies hematochezia, hematuria, hematemesis, epistaxis, black tarry stool.  easy bruising.   Context:  History of hepatitis or HIV infection, denies  Patient has a history of heavy alcohol use.  She reports that she has cut down her alcohol intake since last year and currently she drinks 3-4 beer per day.  With a history of seizure and currently on Keppra.  #Thrombocytopenia increased to 80,000 after a course of dexamethasone 40 mg daily for 4 days. Platelet count further decreased to 38,000 a few weeks later.  INTERVAL HISTORY Abigail Rice is a 55 y.o. female who has above history reviewed by me today presents for follow up visit for management of thrombocytopenia. Problems and complaints are listed below: Patient presents for follow-up.  She reports doing well, no new complaints. She had tooth extraction a month ago with no excessive bleeding. She currently drinks 2-3 beers every day     Review of Systems  Constitutional:  Negative for appetite change, chills, fatigue and fever.   HENT:   Negative for hearing loss and voice change.   Eyes:  Negative for eye problems.  Respiratory:  Negative for chest tightness and cough.   Cardiovascular:  Negative for chest pain.  Gastrointestinal:  Negative for abdominal distention, abdominal pain and blood in stool.  Endocrine: Negative for hot flashes.  Genitourinary:  Negative for difficulty urinating and frequency.   Musculoskeletal:  Negative for arthralgias.  Skin:  Negative for itching and rash.  Neurological:  Negative for extremity weakness.  Hematological:  Negative for adenopathy.  Psychiatric/Behavioral:  Negative for confusion.    MEDICAL HISTORY:  Past Medical History:  Diagnosis Date   Bipolar disorder ([place])    Depression    Diabetes mellitus without complication ([place])    History of seizure 2020   history of 1 seizure   Hyperlipidemia    Hypertension     SURGICAL HISTORY: Past Surgical History:  Procedure Laterality Date   CESAREAN SECTION     COLONOSCOPY WITH PROPOFOL N/A 03/29/2016   Procedure: COLONOSCOPY WITH PROPOFOL;  Surgeon: Josefine Class, MD;  [facility] ENDOSCOPY;  Service: Endoscopy;  Laterality: N/A;   TUBAL LIGATION      SOCIAL HISTORY: Social History   Socioeconomic History   Marital status: Married    Spouse name: Not on file   Number of children: Not on file   Years of education: Not on file   Highest education level: Not on file  Occupational History   Not on file  Tobacco Use   Smoking status: Every Day    Packs/day: 0.50    Years: 25.00    Pack years: 12.50  Types: Cigarettes   Smokeless tobacco: Never  Vaping Use   Vaping Use: Never used  Substance and Sexual Activity   Alcohol use: Yes    Alcohol/week: 2.0 standard drinks    Types: 2 Cans of beer per week    Comment: daily    Drug use: No   Sexual activity: Not on file  Other Topics Concern   Not on file  Social History Narrative   Not on file   Social Determinants of Health   Financial  Resource Strain: Not on file  Food Insecurity: Not on file  Transportation Needs: Not on file  Physical Activity: Not on file  Stress: Not on file  Social Connections: Not on file  Intimate Partner Violence: Not on file    FAMILY HISTORY: Family History  Problem Relation Age of Onset   Diabetes Mother    Hypertension Mother    Breast cancer Sister     ALLERGIES:  is allergic to ace inhibitors, ciprofloxacin, lisinopril, risperdal [risperidone], and vytorin [ezetimibe-simvastatin].  MEDICATIONS:  Current Outpatient Medications  Medication Sig Dispense Refill   amLODipine (NORVASC) 5 MG tablet Take 1 tablet (5 mg total) by mouth daily. 90 tablet 1   aspirin EC 81 MG tablet Take 81 mg by mouth daily.     BD PEN NEEDLE NANO 2ND GEN 32G X 4 MM MISC USE AS DIRECTED 100 each 3   glucose blood (ONETOUCH ULTRA) test strip One touch ultra blue  SUGAR TESTING 1 TIME DAILY. DX e11.65 150 strip 1   hyoscyamine (LEVSIN) 0.125 MG tablet Take 1 tablet (0.125 mg total) by mouth every 6 (six) hours as needed for cramping. 120 tablet 2   Insulin Lispro Prot & Lispro (HUMALOG 75/25 MIX) (75-25) 100 UNIT/ML Kwikpen Inject 20 Units into the skin 2 (two) times daily with a meal. 15 mL 11   levETIRAcetam (KEPPRA) 500 MG tablet Take 1 tablet (500 mg total) by mouth 2 (two) times daily. (Patient taking differently: Take 500 mg by mouth daily. Taking 1/2 tab QD) 180 tablet 1   levothyroxine (SYNTHROID) 50 MCG tablet Take 1 tablet (50 mcg total) by mouth daily before breakfast. 90 tablet 1   losartan (COZAAR) 50 MG tablet Take 1 tablet (50 mg total) by mouth daily. 90 tablet 3   POTASSIUM PO Take by mouth.     rOPINIRole (REQUIP) 0.25 MG tablet Take 1 to 2 tablets po QHS prn 120 tablet 3   No current facility-administered medications for this visit.     PHYSICAL EXAMINATION: ECOG PERFORMANCE STATUS: 0 - Asymptomatic Vitals:   06/06/21 1001  BP: 130/90  Resp: 18  Temp: (!) 97.5 F (36.4 C)   Filed  Weights   06/06/21 1001  Weight: 181 lb 14.4 oz (82.5 kg)    Physical Exam Constitutional:      General: She is not in acute distress. HENT:     Head: Normocephalic and atraumatic.  Eyes:     General: No scleral icterus. Cardiovascular:     Rate and Rhythm: Normal rate and regular rhythm.     Heart sounds: Normal heart sounds.  Pulmonary:     Effort: Pulmonary effort is normal. No respiratory distress.     Breath sounds: No wheezing.  Abdominal:     General: Bowel sounds are normal. There is no distension.     Palpations: Abdomen is soft.  Musculoskeletal:        General: No deformity. Normal range of motion.  Cervical back: Normal range of motion and neck supple.  Skin:    General: Skin is warm and dry.     Findings: No erythema or rash.  Neurological:     Mental Status: She is alert and oriented to person, place, and time. Mental status is at baseline.     Cranial Nerves: No cranial nerve deficit.     Coordination: Coordination normal.  Psychiatric:        Mood and Affect: Mood normal.     LABORATORY DATA:  I have reviewed the data as listed Lab Results  Component Value Date   WBC 4.4 06/06/2021   HGB 11.2 (L) 06/06/2021   HCT 33.5 (L) 06/06/2021   MCV 99.7 06/06/2021   PLT 40 (L) 06/06/2021   Recent Labs    08/22/20 1349 11/01/20 0809 01/17/21 1120 06/06/21 0938  NA 135 136 134* 133*  K 4.2 4.3 3.5 4.2  CL 102 102 102 101  CO2 22 22 24 25   GLUCOSE 150* 174* 110* 125*  BUN 14 18 16 15   CREATININE 1.03* 1.12* 1.02* 1.05*  CALCIUM 8.4* 8.8 8.5* 8.3*  GFRNONAA 62 56* >60 >60  GFRAA 71 64  --   --   PROT 7.1 7.4 8.0 7.4  ALBUMIN 3.4* 3.5* 3.2* 3.3*  AST 69* 61* 106* 67*  ALT 57* 57* 90* 60*  ALKPHOS 205* 287* 207* 176*  BILITOT 0.6 0.5 1.8* 1.5*    Iron/TIBC/Ferritin/ %Sat    Component Value Date/Time   IRON 156 01/17/2021 1120   TIBC 206 (L) 01/17/2021 1120   FERRITIN 595 (H) 01/17/2021 1120   IRONPCTSAT 76 (H) 01/17/2021 1120       RADIOGRAPHIC STUDIES: I have personally reviewed the radiological images as listed and agreed with the findings in the report.    12/13/2020, ultrasound abdomen limited-images are not available to me Impression, mildly increased echogenicity of the liver.  Likely fatty infiltration.  Liver unremarkable contour, echotexture.  Normal size of liver and spleen.  There are innumerable tiny scattered calcifications throughout the spleen.  ASSESSMENT & PLAN:  1. Thrombocytopenia ([place])   2. Normocytic anemia   3. Transaminitis   4. Alcohol use    #Thrombocytopenia possible due to peripheral destruction-possible ITP, chronic liver disease/alcohol consumption. 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  Findings are favored to be reactive in nature.FISH results is normal. 06/06/2021 CBC showed a platelet count of 40,000.  I recommend patient to continue observation.  Encourage her effort in alcohol cessation Recommend observation since the platelet count is above 30,000 in the no active bleeding event. In case of platelet less than 30,000, will consider rechallenge with dexamethasone, also potential rituximab treatment in the future if needed.  Patient may have future elective dental procedure and asked patient to update me a few days prior to his procedure.  I recommend CBC prior to his elective dental procedure.  Abnormal iron panel She has increased ferritin level of 595, increased saturation of 76.  Hemochromatosis screening testing was negative.  Abnormal iron testing may be related to chronic alcohol use and liver injury.  Encourage her effort in alcohol cessation.  #Follow-up 3 months  Orders Placed This Encounter  Procedures   Immature Platelet Fraction    Standing Status:   Future    Number of Occurrences:   1    Standing Expiration Date:   06/06/2022   Ambulatory referral to Gastroenterology    Referral  Priority:   Routine    Referral Type:   Consultation    Referral Reason:   Specialty Services Required    Referred to Provider:   Jonathon Bellows, MD    Number of Visits Requested:   1    All questions were answered. The patient knows to call the clinic with any problems questions or concerns.  Cc Lavera Guise, MD   Earlie Server, MD, PhD 06/06/2021

## 2021-06-16 ENCOUNTER — Other Ambulatory Visit: Payer: Self-pay | Admitting: Nurse Practitioner

## 2021-06-16 DIAGNOSIS — G2581 Restless legs syndrome: Secondary | ICD-10-CM

## 2021-07-25 ENCOUNTER — Encounter: Payer: Self-pay | Admitting: Gastroenterology

## 2021-07-25 ENCOUNTER — Other Ambulatory Visit: Payer: Self-pay

## 2021-07-25 ENCOUNTER — Ambulatory Visit (INDEPENDENT_AMBULATORY_CARE_PROVIDER_SITE_OTHER): Payer: Managed Care, Other (non HMO) | Admitting: Gastroenterology

## 2021-07-25 VITALS — BP 125/78 | HR 90 | Temp 99.0°F | Ht 65.0 in | Wt 185.0 lb

## 2021-07-25 DIAGNOSIS — R945 Abnormal results of liver function studies: Secondary | ICD-10-CM | POA: Diagnosis not present

## 2021-07-25 DIAGNOSIS — F1010 Alcohol abuse, uncomplicated: Secondary | ICD-10-CM | POA: Diagnosis not present

## 2021-07-25 DIAGNOSIS — D696 Thrombocytopenia, unspecified: Secondary | ICD-10-CM | POA: Diagnosis not present

## 2021-07-25 DIAGNOSIS — R7989 Other specified abnormal findings of blood chemistry: Secondary | ICD-10-CM

## 2021-07-25 NOTE — Addendum Note (Signed)
Addended by: Wayna Chalet on: 07/25/2021 08:51 AM   Modules accepted: Orders

## 2021-07-25 NOTE — Patient Instructions (Signed)
Please remember to not eat or drink after midnight the night before. Please remember to arrive 30 minutes before ultrasound.

## 2021-07-25 NOTE — Progress Notes (Signed)
Abigail Bellows MD, MRCP(U.K) [address]  [place]  [place], [place] [address]  Main: [phone]  Fax: [phone]   Gastroenterology Consultation  Referring Provider:     Dr Tasia Catchings  Primary Care Physician:  Lavera Guise, MD Primary Gastroenterologist:  Dr. Jonathon Rice  Reason for Consultation:     Transaminitis, alcohol use        HPI:   Abigail Rice is a 55 y.o. y/o female referred for transaminitis.  She sees Dr. Tasia Catchings in hematology for thrombocytopenia.  History of heavy alcohol consumption 3-4 beers per day.  Differentials being considered are ITP and chronic alcohol consumption/liver disease.  Undergone a bone marrow biopsy in March  She continues to drink alcohol on days she is not working.  She is trying to cut down.  She has not sought any official help.  Denies any illegal drug use.  Denies any Armed forces logistics/support/administrative officer.  No family history of liver disease.  Denies any tattoos.  Not received a blood transfusion.  No other symptoms.  Right upper quadrant ultrasound in December 2021 showed mild increased echogenicity of the liver likely fatty infiltration.  06/06/2021: Hemoglobin 11.2 g with a platelet count of 40 MCV of 99.7, albumin 3.3 alkaline phosphatase of 176, AST of 67, ALT of 60 hemochromatosis genetic testing showed no mutation.  HIV testing was negative acute hepatitis panel showed no active hepatitis B surface antigen or hepatitis C virus antibody.  B12 levels was normal in January 2022 iron studies showed increased iron percentage saturation of 76% and a ferritin of 595.    Past Medical History:  Diagnosis Date   Bipolar disorder ([place])    Depression    Diabetes mellitus without complication ([place])    History of seizure 2020   history of 1 seizure   Hyperlipidemia    Hypertension     Past Surgical History:  Procedure Laterality Date   CESAREAN SECTION     COLONOSCOPY WITH PROPOFOL N/A 03/29/2016   Procedure: COLONOSCOPY WITH PROPOFOL;  Surgeon: Josefine Class,  MD;  Location: [facility] ENDOSCOPY;  Service: Endoscopy;  Laterality: N/A;   TUBAL LIGATION      Prior to Admission medications   Medication Sig Start Date End Date Taking? Authorizing Provider  amLODipine (NORVASC) 5 MG tablet Take 1 tablet (5 mg total) by mouth daily. 04/05/21   McDonough, Si Gaul, PA-C  aspirin EC 81 MG tablet Take 81 mg by mouth daily.    [provider]  BD PEN NEEDLE NANO 2ND GEN 32G X 4 MM MISC USE AS DIRECTED 03/29/21   Lavera Guise, MD  glucose blood (ONETOUCH ULTRA) test strip One touch ultra blue  SUGAR TESTING 1 TIME DAILY. DX e11.65 12/27/20   Lavera Guise, MD  hyoscyamine (LEVSIN) 0.125 MG tablet Take 1 tablet (0.125 mg total) by mouth every 6 (six) hours as needed for cramping. 09/15/20   Ronnell Freshwater, NP  Insulin Lispro Prot & Lispro (HUMALOG 75/25 MIX) (75-25) 100 UNIT/ML Kwikpen Inject 20 Units into the skin 2 (two) times daily with a meal. 10/25/20   Lavera Guise, MD  levETIRAcetam (KEPPRA) 500 MG tablet Take 1 tablet (500 mg total) by mouth 2 (two) times daily. Patient taking differently: Take 500 mg by mouth daily. Taking 1/2 tab QD 10/09/20   Ronnell Freshwater, NP  levothyroxine (SYNTHROID) 50 MCG tablet Take 1 tablet (50 mcg total) by mouth daily before breakfast. 05/09/21   Clayborn Bigness  M, MD  losartan (COZAAR) 50 MG tablet Take 1 tablet (50 mg total) by mouth daily. 10/20/20   Luiz Ochoa, NP  POTASSIUM PO Take by mouth.    [provider]  rOPINIRole (REQUIP) 0.25 MG tablet Take 1 to 2 tablets po QHS prn 04/10/20   Ronnell Freshwater, NP    Family History  Problem Relation Age of Onset   Diabetes Mother    Hypertension Mother    Breast cancer Sister      Social History   Tobacco Use   Smoking status: Every Day    Packs/day: 0.50    Years: 25.00    Pack years: 12.50    Types: Cigarettes   Smokeless tobacco: Never  Vaping Use   Vaping Use: Never used  Substance Use Topics   Alcohol use: Yes    Alcohol/week: 2.0 standard  drinks    Types: 2 Cans of beer per week    Comment: daily    Drug use: No    Allergies as of 07/25/2021 - Review Complete 07/25/2021  Allergen Reaction Noted   Ace inhibitors  03/28/2016   Ciprofloxacin  08/16/2019   Lisinopril  01/23/2018   Risperdal [risperidone]  01/17/2021   Vytorin [ezetimibe-simvastatin]  03/28/2016    Review of Systems:    All systems reviewed and negative except where noted in HPI.   Physical Exam:  BP 125/78   Pulse 90   Temp 99 F (37.2 C) (Oral)   Ht _0  (1.651 m)   Wt 185 lb (83.9 kg)   LMP 11/07/2015 (Approximate)   BMI 30.79 kg/m  Patient's last menstrual period was 11/07/2015 (approximate). Psych:  Alert and cooperative. Normal mood and affect. General:   Alert,  Well-developed, well-nourished, pleasant and cooperative in NAD Head:  Normocephalic and atraumatic. Eyes:  Sclera clear, no icterus.   Conjunctiva pink. Ears:  Normal auditory acuity. Nose:  No deformity, discharge, or lesions. Mouth:  No deformity or lesions,oropharynx pink & moist. Neck:  Supple; no masses or thyromegaly. Lungs:  Respirations even and unlabored.  Clear throughout to auscultation.   No wheezes, crackles, or rhonchi. No acute distress. Heart:  Regular rate and rhythm; no murmurs, clicks, rubs, or gallops. Abdomen:  Normal bowel sounds.  No bruits.  Soft, non-tender and non-distended without masses,  Neurologic:  Alert and oriented x3;  grossly normal neurologically. Psych:  Alert and cooperative. Normal mood and affect.  Imaging Studies: No results found.  Assessment and Plan:   [name] is a 55 y.o. y/o female has been referred for transaminitis.  History of excess alcohol consumption.  Follows with Dr. Tasia Catchings in hematology.  Underwent a bone marrow biopsy.  Prior imaging did not show features of portal hypertension.   Plan 1.  We will obtain full autoimmune work-up for liver disease.  If negative likely etiology would be secondary to alcohol.  Which  is most likely the reason  2.  I will obtain right upper quadrant ultrasound since been over 8 months since she last had 1 and we will also obtain liver elastography to determine level of fibrosis of the liver.  3.  Strongly suggest her to avoid all alcohol consumption and join alcoholic Anonymous.  I have clearly explained to her that she would die if she does not do so and continues to drink alcohol  4.  If she does have liver cirrhosis based on imaging/fibrosis scan then would need an EGD down the road  Follow  up in 8 to 12 weeks  Dr Abigail Bellows MD,MRCP(U.K)

## 2021-07-29 LAB — IMMUNOGLOBULINS A/E/G/M, SERUM
IgA/Immunoglobulin A, Serum: 391 mg/dL — ABNORMAL HIGH (ref 87–352)
IgE (Immunoglobulin E), Serum: 1726 IU/mL — ABNORMAL HIGH (ref 6–495)
IgG (Immunoglobin G), Serum: 1911 mg/dL — ABNORMAL HIGH (ref 586–1602)
IgM (Immunoglobulin M), Srm: 35 mg/dL (ref 26–217)

## 2021-07-29 LAB — ALPHA-1-ANTITRYPSIN: A-1 Antitrypsin: 144 mg/dL (ref 101–187)

## 2021-07-29 LAB — CELIAC DISEASE AB SCREEN W/RFX
Antigliadin Abs, IgA: 4 units (ref 0–19)
Transglutaminase IgA: <2 U/mL (ref 0–3)

## 2021-07-29 LAB — ANA: Anti Nuclear Antibody (ANA): POSITIVE — AB

## 2021-07-29 LAB — HEPATITIS B E ANTIBODY: Hep B E Ab: NEGATIVE

## 2021-07-29 LAB — HEPATITIS B E ANTIGEN: Hep B E Ag: POSITIVE — AB

## 2021-07-29 LAB — HEPATITIS A ANTIBODY, TOTAL: hep A Total Ab: NEGATIVE

## 2021-07-29 LAB — HEPATITIS B CORE ANTIBODY, TOTAL: Hep B Core Total Ab: NEGATIVE

## 2021-07-29 LAB — MITOCHONDRIAL/SMOOTH MUSCLE AB PNL
Mitochondrial Ab: <20 Units (ref 0.0–20.0)
Smooth Muscle Ab: 10 Units (ref 0–19)

## 2021-07-29 LAB — CERULOPLASMIN: Ceruloplasmin: 14.7 mg/dL — ABNORMAL LOW (ref 19.0–39.0)

## 2021-07-29 LAB — ANTI-MICROSOMAL ANTIBODY LIVER / KIDNEY: LKM1 Ab: 0.7 Units (ref 0.0–20.0)

## 2021-07-29 LAB — CK: Total CK: 296 U/L — ABNORMAL HIGH (ref 32–182)

## 2021-07-29 LAB — HEPATITIS B SURFACE ANTIBODY,QUALITATIVE: Hep B Surface Ab, Qual: NONREACTIVE

## 2021-08-01 ENCOUNTER — Ambulatory Visit: Payer: Managed Care, Other (non HMO) | Admitting: Gastroenterology

## 2021-08-01 ENCOUNTER — Telehealth: Payer: Self-pay

## 2021-08-01 DIAGNOSIS — R768 Other specified abnormal immunological findings in serum: Secondary | ICD-10-CM

## 2021-08-01 DIAGNOSIS — R748 Abnormal levels of other serum enzymes: Secondary | ICD-10-CM

## 2021-08-01 NOTE — Telephone Encounter (Signed)
-----   Message from Jonathon Bellows, MD sent at 07/30/2021  9:54 AM EDT ----- Herb Grays please Inform  1. Hep B e antigen is positive -   Check Hep B viral load, Hep Delta IGM/IGG and viral load   2. Needs Hep A vaccine   3. Ceruloplasmin is low- check 24 hour urinary copper and refer to ophthalmology for slit lamp test to check for " KF rings"  4. CK elevated- check TSH and repeat CK   C/c Lavera Guise, MD (FYI)   Dr Jonathon Bellows MD,MRCP [facility]) Gastroenterology/Hepatology Pager: [phone]

## 2021-08-01 NOTE — Telephone Encounter (Signed)
Called patient but not able to leave her a voicemail. Therefore, I will send her a letter with Dr. Georgeann Oppenheim recommendations and orders.

## 2021-08-02 ENCOUNTER — Other Ambulatory Visit: Payer: Managed Care, Other (non HMO) | Admitting: Physician Assistant

## 2021-08-09 ENCOUNTER — Ambulatory Visit
Admission: RE | Admit: 2021-08-09 | Discharge: 2021-08-09 | Disposition: A | Discharge status: Home / Self Care | Payer: Managed Care, Other (non HMO) | Source: Ambulatory Visit | Attending: Gastroenterology | Admitting: Gastroenterology

## 2021-08-09 DIAGNOSIS — F1010 Alcohol abuse, uncomplicated: Secondary | ICD-10-CM | POA: Insufficient documentation

## 2021-08-09 DIAGNOSIS — R945 Abnormal results of liver function studies: Secondary | ICD-10-CM | POA: Diagnosis not present

## 2021-08-09 DIAGNOSIS — R7989 Other specified abnormal findings of blood chemistry: Secondary | ICD-10-CM

## 2021-08-16 LAB — CBC WITH DIFFERENTIAL
Basophils Absolute: 0 10*3/uL (ref 0.0–0.2)
Basos: 1 %
EOS (ABSOLUTE): 0 10*3/uL (ref 0.0–0.4)
Eos: 1 %
Hematocrit: 31.4 % — ABNORMAL LOW (ref 34.0–46.6)
Hemoglobin: 11.3 g/dL (ref 11.1–15.9)
Immature Grans (Abs): 0 10*3/uL (ref 0.0–0.1)
Immature Granulocytes: 0 %
Lymphocytes Absolute: 1.7 10*3/uL (ref 0.7–3.1)
Lymphs: 39 %
MCH: 33.6 pg — ABNORMAL HIGH (ref 26.6–33.0)
MCHC: 36 g/dL — ABNORMAL HIGH (ref 31.5–35.7)
MCV: 94 fL (ref 79–97)
Monocytes Absolute: 0.4 10*3/uL (ref 0.1–0.9)
Monocytes: 8 %
Neutrophils Absolute: 2.3 10*3/uL (ref 1.4–7.0)
Neutrophils: 51 %
RBC: 3.36 x10E6/uL — ABNORMAL LOW (ref 3.77–5.28)
RDW: 13.6 % (ref 11.7–15.4)
WBC: 4.4 10*3/uL (ref 3.4–10.8)

## 2021-08-16 LAB — COMPREHENSIVE METABOLIC PANEL
ALT: 71 IU/L — ABNORMAL HIGH (ref 0–32)
AST: 71 IU/L — ABNORMAL HIGH (ref 0–40)
Albumin/Globulin Ratio: 1 — ABNORMAL LOW (ref 1.2–2.2)
Albumin: 3.4 g/dL — ABNORMAL LOW (ref 3.8–4.9)
Alkaline Phosphatase: 307 IU/L — ABNORMAL HIGH (ref 44–121)
BUN/Creatinine Ratio: 10 (ref 9–23)
BUN: 10 mg/dL (ref 6–24)
Bilirubin Total: 1.1 mg/dL (ref 0.0–1.2)
CO2: 20 mmol/L (ref 20–29)
Calcium: 8.3 mg/dL — ABNORMAL LOW (ref 8.7–10.2)
Chloride: 99 mmol/L (ref 96–106)
Creatinine, Ser: 1.01 mg/dL — ABNORMAL HIGH (ref 0.57–1.00)
Globulin, Total: 3.5 g/dL (ref 1.5–4.5)
Glucose: 312 mg/dL — ABNORMAL HIGH (ref 65–99)
Potassium: 3.9 mmol/L (ref 3.5–5.2)
Sodium: 133 mmol/L — ABNORMAL LOW (ref 134–144)
Total Protein: 6.9 g/dL (ref 6.0–8.5)
eGFR: 66 mL/min/{1.73_m2} (ref >59)

## 2021-08-16 LAB — MAGNESIUM: Magnesium: 1.4 mg/dL — ABNORMAL LOW (ref 1.6–2.3)

## 2021-08-16 LAB — VITAMIN D 25 HYDROXY (VIT D DEFICIENCY, FRACTURES): Vit D, 25-Hydroxy: 42.9 ng/mL (ref 30.0–100.0)

## 2021-08-16 LAB — LIPID PANEL WITH LDL/HDL RATIO
Cholesterol, Total: 133 mg/dL (ref 100–199)
HDL: 24 mg/dL — ABNORMAL LOW (ref >39)
LDL Chol Calc (NIH): 71 mg/dL (ref 0–99)
LDL/HDL Ratio: 3 ratio (ref 0.0–3.2)
Triglycerides: 226 mg/dL — ABNORMAL HIGH (ref 0–149)
VLDL Cholesterol Cal: 38 mg/dL (ref 5–40)

## 2021-08-16 LAB — TSH+FREE T4
Free T4: 1.28 ng/dL (ref 0.82–1.77)
TSH: 1.83 u[IU]/mL (ref 0.450–4.500)

## 2021-08-23 LAB — TSH: TSH: 1.84 u[IU]/mL (ref 0.450–4.500)

## 2021-08-23 LAB — HEPATITIS B DNA, ULTRAQUANTITATIVE, PCR: HBV DNA SERPL PCR-ACNC: NOT DETECTED IU/mL

## 2021-08-23 LAB — CK: Total CK: 266 U/L — ABNORMAL HIGH (ref 32–182)

## 2021-08-23 LAB — HEPATITIS D QRT-PCR (SERUM): HDV qRT-PCR (serum): NOT DETECTED

## 2021-08-28 LAB — COPPER, URINE - RANDOM OR 24 HOUR
Copper / Creatinine Ratio: 42 ug/g creat (ref 0–49)
Copper, 24H Ur: 20 ug/24 hr (ref 3–35)
Copper, Ur: 13 ug/L
Creatinine(Crt),U: 0.31 g/L (ref 0.30–3.00)

## 2021-08-31 ENCOUNTER — Telehealth: Payer: Self-pay

## 2021-08-31 NOTE — Telephone Encounter (Signed)
-----   Message from Jonathon Bellows, MD sent at 08/30/2021  2:15 PM EDT ----- Herb Grays please inform the patient that she possibly could have early liver cirrhosis i.e. scarring of the liver.  I would suggest an EGD to screen for esophageal varices.  If she is willing please go ahead and schedule.  C/c Lavera Guise, MD (FYI)  Dr Jonathon Bellows MD,MRCP [facility]) Gastroenterology/Hepatology Pager: [phone]

## 2021-08-31 NOTE — Telephone Encounter (Signed)
Called patient but had to speak to her husband-Abigail Rice. He stated that she was at work but that he would give her the results. I also told him that if she decided to move forward with an EGD, to please call us back so we could schedule it for her.

## 2021-09-03 NOTE — Telephone Encounter (Signed)
Called patient back since she left me a voicemail on Friday afternoon. However, I had to leave a voicemail with her husband-Checo again letting him know that I was returning her phone call to schedule her EGD. He stated that he would let her know to call me back.

## 2021-09-04 ENCOUNTER — Other Ambulatory Visit: Payer: Self-pay

## 2021-09-04 DIAGNOSIS — K7030 Alcoholic cirrhosis of liver without ascites: Secondary | ICD-10-CM

## 2021-09-04 NOTE — Telephone Encounter (Signed)
Called patient back and she agreed on getting her EGD on 09/20/2021.

## 2021-09-05 ENCOUNTER — Telehealth: Payer: Self-pay

## 2021-09-05 NOTE — Telephone Encounter (Signed)
-----   Message from Jonathon Bellows, MD sent at 09/04/2021 11:27 AM EDT ----- Herb Grays   Inform patient that hepatitis B and D not detected.  CK levels elevated on 2 occasions when repeat check.  This can sometimes cause elevated liver function test.  I would recommend her to discuss with Dr. Jason Nest and may need rheumatology referral.   Follow-up per schedule in my office to continue work-up for liver disease   Dr Jonathon Bellows MD,MRCP [facility]) Gastroenterology/Hepatology Pager: [phone]

## 2021-09-05 NOTE — Telephone Encounter (Signed)
Patient did not answer her phone and wasn't able to leave her a voicemail. Therefore, I will be sending her a letter.

## 2021-09-10 ENCOUNTER — Ambulatory Visit (INDEPENDENT_AMBULATORY_CARE_PROVIDER_SITE_OTHER): Payer: Managed Care, Other (non HMO) | Admitting: Physician Assistant

## 2021-09-10 ENCOUNTER — Other Ambulatory Visit: Payer: Self-pay

## 2021-09-10 ENCOUNTER — Encounter: Payer: Self-pay | Admitting: Physician Assistant

## 2021-09-10 DIAGNOSIS — I10 Essential (primary) hypertension: Secondary | ICD-10-CM | POA: Diagnosis not present

## 2021-09-10 DIAGNOSIS — E039 Hypothyroidism, unspecified: Secondary | ICD-10-CM

## 2021-09-10 DIAGNOSIS — K580 Irritable bowel syndrome with diarrhea: Secondary | ICD-10-CM

## 2021-09-10 DIAGNOSIS — D696 Thrombocytopenia, unspecified: Secondary | ICD-10-CM | POA: Diagnosis not present

## 2021-09-10 DIAGNOSIS — Z0001 Encounter for general adult medical examination with abnormal findings: Secondary | ICD-10-CM

## 2021-09-10 DIAGNOSIS — E1165 Type 2 diabetes mellitus with hyperglycemia: Secondary | ICD-10-CM

## 2021-09-10 DIAGNOSIS — G2581 Restless legs syndrome: Secondary | ICD-10-CM

## 2021-09-10 DIAGNOSIS — Z113 Encounter for screening for infections with a predominantly sexual mode of transmission: Secondary | ICD-10-CM

## 2021-09-10 DIAGNOSIS — Z124 Encounter for screening for malignant neoplasm of cervix: Secondary | ICD-10-CM | POA: Diagnosis not present

## 2021-09-10 DIAGNOSIS — R748 Abnormal levels of other serum enzymes: Secondary | ICD-10-CM

## 2021-09-10 DIAGNOSIS — Z01419 Encounter for gynecological examination (general) (routine) without abnormal findings: Secondary | ICD-10-CM

## 2021-09-10 DIAGNOSIS — R300 Dysuria: Secondary | ICD-10-CM

## 2021-09-10 LAB — POCT GLYCOSYLATED HEMOGLOBIN (HGB A1C): Hemoglobin A1C: 8.5 % — AB (ref 4.0–5.6)

## 2021-09-10 MED ORDER — AMLODIPINE BESYLATE 5 MG PO TABS: 5.0000 mg | ORAL_TABLET | Freq: Every day | ORAL | 1 refills | Status: DC

## 2021-09-10 MED ORDER — BD PEN NEEDLE NANO 2ND GEN 32G X 4 MM MISC: 3 refills | Status: DC

## 2021-09-10 MED ORDER — HYOSCYAMINE SULFATE 0.125 MG PO TABS: 0.1250 mg | ORAL_TABLET | Freq: Four times a day (QID) | ORAL | 2 refills | Status: DC | PRN

## 2021-09-10 MED ORDER — INSULIN LISPRO PROT & LISPRO (75-25 MIX) 100 UNIT/ML KWIKPEN: PEN_INJECTOR | SUBCUTANEOUS | 11 refills | Status: DC

## 2021-09-10 MED ORDER — LEVOTHYROXINE SODIUM 50 MCG PO TABS: 50.0000 ug | ORAL_TABLET | Freq: Every day | ORAL | 1 refills | Status: DC

## 2021-09-10 MED ORDER — LOSARTAN POTASSIUM 50 MG PO TABS: 50.0000 mg | ORAL_TABLET | Freq: Every day | ORAL | 3 refills | Status: DC

## 2021-09-10 MED ORDER — ROPINIROLE HCL 0.25 MG PO TABS: ORAL_TABLET | ORAL | 3 refills | Status: DC

## 2021-09-10 NOTE — Progress Notes (Signed)
[facility] [place], [place] [address]  Internal MEDICINE  Office Visit Note  Patient Name: Abigail Rice  [id number]  [id number]  Date of Service: 09/12/2021  Chief Complaint  Patient presents with   Annual Exam   Depression   Diabetes   Hyperlipidemia   Hypertension     HPI Pt is here for routine health maintenance examination -BP at home 120s/70 -BG not checked regularly, and will start checking again. Takes her 20units of insulin BID, but has not used sliding scale in a long time. has been eating more sweets lately though -Sleep is pretty good, RLS much improved with requip. Says she used a spray from [facility] for muscle cramps -4-6 beers per day and is working on cutting down. Gi is following now monitoring LFTs  -Sees Dr. Tasia Catchings on Wednesday who is following her thrombocytopenia -reviewed labs with her which have several abnormalities that are already being addressed by heme/onc as well as GI -based on labs will start back on magesium supplement and will start calcium. Sodium is also a little low. -had her mammogram done recently and declines breast exam. Is up to date on colonoscopy -Foot exam and pap due today  Current Medication: Outpatient Encounter Medications as of 09/10/2021  Medication Sig   amLODipine (NORVASC) 5 MG tablet Take 1 tablet (5 mg total) by mouth daily.   Apple Cider Vinegar 188 MG CAPS Take 1 capsule by mouth daily.   aspirin EC 81 MG tablet Take 81 mg by mouth daily.   cholecalciferol (VITAMIN D3) 25 MCG (1000 UNIT) tablet Take 1,000 Units by mouth daily.   glucose blood (ONETOUCH ULTRA) test strip One touch ultra blue  SUGAR TESTING 1 TIME DAILY. DX e11.65   hyoscyamine (LEVSIN) 0.125 MG tablet Take 1 tablet (0.125 mg total) by mouth every 6 (six) hours as needed for cramping.   Insulin Lispro Prot & Lispro (HUMALOG 75/25 MIX) (75-25) 100 UNIT/ML Kwikpen Inject 20 Units into the skin 2 (two) times daily with a meal.   Insulin  Pen Needle (BD PEN NEEDLE NANO 2ND GEN) 32G X 4 MM MISC USE AS DIRECTED   levETIRAcetam (KEPPRA) 500 MG tablet Take 1 tablet (500 mg total) by mouth 2 (two) times daily. (Patient taking differently: Take 500 mg by mouth daily. Taking 1/2 tab QD)   levothyroxine (SYNTHROID) 50 MCG tablet Take 1 tablet (50 mcg total) by mouth daily before breakfast.   losartan (COZAAR) 50 MG tablet Take 1 tablet (50 mg total) by mouth daily.   rOPINIRole (REQUIP) 0.25 MG tablet Take 1 to 2 tablets po QHS prn   vitamin B-12 (CYANOCOBALAMIN) 100 MCG tablet Take 100 mcg by mouth daily.   [DISCONTINUED] amLODipine (NORVASC) 5 MG tablet Take 1 tablet (5 mg total) by mouth daily.   [DISCONTINUED] BD PEN NEEDLE NANO 2ND GEN 32G X 4 MM MISC USE AS DIRECTED   [DISCONTINUED] hyoscyamine (LEVSIN) 0.125 MG tablet Take 1 tablet (0.125 mg total) by mouth every 6 (six) hours as needed for cramping.   [DISCONTINUED] Insulin Lispro Prot & Lispro (HUMALOG 75/25 MIX) (75-25) 100 UNIT/ML Kwikpen Inject 20 Units into the skin 2 (two) times daily with a meal.   [DISCONTINUED] levothyroxine (SYNTHROID) 50 MCG tablet Take 1 tablet (50 mcg total) by mouth daily before breakfast.   [DISCONTINUED] losartan (COZAAR) 50 MG tablet Take 1 tablet (50 mg total) by mouth daily.   [DISCONTINUED] rOPINIRole (REQUIP) 0.25 MG tablet Take 1 to 2 tablets  po QHS prn   No facility-administered encounter medications on file as of 09/10/2021.    Surgical History: Past Surgical History:  Procedure Laterality Date   CESAREAN SECTION     COLONOSCOPY WITH PROPOFOL N/A 03/29/2016   Procedure: COLONOSCOPY WITH PROPOFOL;  Surgeon: Josefine Class, MD;  Location: [facility] ENDOSCOPY;  Service: Endoscopy;  Laterality: N/A;   TUBAL LIGATION      Medical History: Past Medical History:  Diagnosis Date   Bipolar disorder ([place])    Depression    Diabetes mellitus without complication ([place])    History of seizure 2020   history of 1 seizure   Hyperlipidemia     Hypertension     Family History: Family History  Problem Relation Age of Onset   Diabetes Mother    Hypertension Mother    Breast cancer Sister       Review of Systems  Constitutional:  Negative for chills, fatigue and unexpected weight change.  HENT:  Negative for congestion, postnasal drip, rhinorrhea, sneezing and sore throat.   Eyes:  Negative for redness.  Respiratory:  Negative for cough, chest tightness and shortness of breath.   Cardiovascular:  Negative for chest pain and palpitations.  Gastrointestinal:  Negative for abdominal pain, constipation, diarrhea, nausea and vomiting.  Genitourinary:  Negative for dysuria and frequency.  Musculoskeletal:  Negative for arthralgias, back pain, joint swelling and neck pain.  Skin:  Negative for rash.  Neurological: Negative.  Negative for tremors and numbness.  Hematological:  Negative for adenopathy. Does not bruise/bleed easily.  Psychiatric/Behavioral:  Negative for behavioral problems (Depression), sleep disturbance and suicidal ideas. The patient is not nervous/anxious.     Vital Signs: BP 139/86   Pulse 92   Temp 97.8 F (36.6 C)   Resp 16   Ht _0  (1.651 m)   Wt 180 lb (81.6 kg)   LMP 11/07/2015 (Approximate)   SpO2 98%   BMI 29.95 kg/m    Physical Exam Vitals and nursing note reviewed.  Constitutional:      General: She is not in acute distress.    Appearance: She is well-developed. She is obese. She is not diaphoretic.  HENT:     Head: Normocephalic and atraumatic.     Right Ear: External ear normal.     Left Ear: External ear normal.     Nose: Nose normal.     Mouth/Throat:     Pharynx: No oropharyngeal exudate.  Eyes:     General: No scleral icterus.       Right eye: No discharge.        Left eye: No discharge.     Conjunctiva/sclera: Conjunctivae normal.     Pupils: Pupils are equal, round, and reactive to light.  Neck:     Thyroid: No thyromegaly.     Vascular: No JVD.     Trachea: No  tracheal deviation.  Cardiovascular:     Rate and Rhythm: Normal rate and regular rhythm.     Pulses:          Dorsalis pedis pulses are 3+ on the right side and 3+ on the left side.       Posterior tibial pulses are 3+ on the right side and 3+ on the left side.     Heart sounds: Normal heart sounds. No murmur heard.   No friction rub. No gallop.  Pulmonary:     Effort: Pulmonary effort is normal. No respiratory distress.     Breath sounds: Normal  breath sounds. No stridor. No wheezing or rales.  Chest:     Chest wall: No tenderness.  Abdominal:     General: Bowel sounds are normal. There is no distension.     Palpations: Abdomen is soft. There is no mass.     Tenderness: There is no abdominal tenderness. There is no guarding or rebound.  Genitourinary:    Exam position: Lithotomy position.     Vagina: Normal.     Cervix: Normal.     Comments: Pap performed Musculoskeletal:        General: No tenderness or deformity. Normal range of motion.     Cervical back: Normal range of motion and neck supple.     Right foot: Normal range of motion.     Left foot: Normal range of motion.  Feet:     Right foot:     Protective Sensation: 2 sites tested.  2 sites sensed.     Skin integrity: Dry skin present.     Toenail Condition: Right toenails are normal.     Left foot:     Protective Sensation: 2 sites tested.  2 sites sensed.     Skin integrity: Dry skin present.     Toenail Condition: Left toenails are normal.  Lymphadenopathy:     Cervical: No cervical adenopathy.  Skin:    General: Skin is warm and dry.     Coloration: Skin is not pale.     Findings: No erythema or rash.  Neurological:     Mental Status: She is alert.     Cranial Nerves: No cranial nerve deficit.     Motor: No abnormal muscle tone.     Coordination: Coordination normal.     Deep Tendon Reflexes: Reflexes are normal and symmetric.  Psychiatric:        Behavior: Behavior normal.        Thought Content: Thought  content normal.        Judgment: Judgment normal.     LABS: Recent Results (from the past 2160 hour(s))  Hepatitis A antibody, total     Status: None   Collection Time: 07/25/21  8:52 AM  Result Value Ref Range   hep A Total Ab Negative Negative  Hepatitis B core antibody, total     Status: None   Collection Time: 07/25/21  8:52 AM  Result Value Ref Range   Hep B Core Total Ab Negative Negative  Hepatitis B e antigen     Status: Abnormal   Collection Time: 07/25/21  8:52 AM  Result Value Ref Range   Hep B E Ag Positive (A) Negative  Hepatitis B e antibody     Status: None   Collection Time: 07/25/21  8:52 AM  Result Value Ref Range   Hep B E Ab Negative Negative  Hepatitis B surface antibody,qualitative     Status: None   Collection Time: 07/25/21  8:52 AM  Result Value Ref Range   Hep B Surface Ab, Qual Non Reactive     Comment:               Non Reactive: Inconsistent with immunity,                             less than 10 mIU/mL               Reactive:     Consistent with immunity,  greater than 9.9 mIU/mL   ANA     Status: Abnormal   Collection Time: 07/25/21  8:52 AM  Result Value Ref Range   Anti Nuclear Antibody (ANA) Positive (A) Negative  Mitochondrial/smooth muscle ab pnl     Status: None   Collection Time: 07/25/21  8:52 AM  Result Value Ref Range   Smooth Muscle Ab 10 0 - 19 Units    Comment:                  Negative                     0 - 19                  Weak positive               20 - 30                  Moderate to strong positive     >30  Actin Antibodies are found in 52-85% of patients with  autoimmune hepatitis or chronic active hepatitis and  in 22% of patients with primary biliary cirrhosis.    Mitochondrial Ab <20.0 0.0 - 20.0 Units    Comment:                                 Negative    0.0 - 20.0                                 Equivocal  20.1 - 24.9                                 Positive          >24.9 Mitochondrial (M2) Antibodies are found in 90-96% of patients with primary biliary cirrhosis.   Ceruloplasmin     Status: Abnormal   Collection Time: 07/25/21  8:52 AM  Result Value Ref Range   Ceruloplasmin 14.7 (L) 19.0 - 39.0 mg/dL  Immunoglobulins, QN, A/E/G/M     Status: Abnormal   Collection Time: 07/25/21  8:52 AM  Result Value Ref Range   IgG (Immunoglobin G), Serum 1,911 (H) 586 - 1,602 mg/dL   IgA/Immunoglobulin A, Serum 391 (H) 87 - 352 mg/dL   IgM (Immunoglobulin M), Srm 35 26 - 217 mg/dL   IgE (Immunoglobulin E), Serum 1,726 (H) 6 - 495 IU/mL  Celiac Disease Ab Screen w/Rfx     Status: None   Collection Time: 07/25/21  8:52 AM  Result Value Ref Range   Antigliadin Abs, IgA 4 0 - 19 units    Comment:                    Negative                   0 - 19                    Weak Positive             20 - 30                    Moderate to Strong Positive   >30    Transglutaminase IgA <2 0 - 3  U/mL    Comment:                               Negative        0 -  3                               Weak Positive   4 - 10                               Positive           >10  Tissue Transglutaminase (tTG) has been identified  as the endomysial antigen.  Studies have demonstr-  ated that endomysial IgA antibodies have over 99%  specificity for gluten sensitive enteropathy.   Alpha-1-antitrypsin     Status: None   Collection Time: 07/25/21  8:52 AM  Result Value Ref Range   A-1 Antitrypsin 144 101 - 187 mg/dL  AntiMicrosomal Ab-Liver / Kidney     Status: None   Collection Time: 07/25/21  8:52 AM  Result Value Ref Range   LKM1 Ab 0.7 0.0 - 20.0 Units    Comment:                                 Negative    0.0 - 20.0                                 Equivocal  20.1 - 24.9                                 Positive         >24.9 LKM type 1 antibodies are detected in patients with autoimmune hepatitis type 2 and in up to 8% of patients with chronic HCV infection.   CK      Status: Abnormal   Collection Time: 07/25/21  8:52 AM  Result Value Ref Range   Total CK 296 (H) 32 - 182 U/L  TSH + free T4     Status: None   Collection Time: 08/15/21  8:54 AM  Result Value Ref Range   TSH 1.830 0.450 - 4.500 uIU/mL   Free T4 1.28 0.82 - 1.77 ng/dL  Lipid Panel With LDL/HDL Ratio     Status: Abnormal   Collection Time: 08/15/21  8:54 AM  Result Value Ref Range   Cholesterol, Total 133 100 - 199 mg/dL   Triglycerides 226 (H) 0 - 149 mg/dL   HDL 24 (L) >39 mg/dL   VLDL Cholesterol Cal 38 5 - 40 mg/dL   LDL Chol Calc (NIH) 71 0 - 99 mg/dL   LDL/HDL Ratio 3.0 0.0 - 3.2 ratio    Comment:                                     LDL/HDL Ratio  Men  Women                               1/2 Avg.Risk  1.0    1.5                                   Avg.Risk  3.6    3.2                                2X Avg.Risk  6.2    5.0                                3X Avg.Risk  8.0    6.1   Comprehensive metabolic panel     Status: Abnormal   Collection Time: 08/15/21  8:54 AM  Result Value Ref Range   Glucose 312 (H) 65 - 99 mg/dL   BUN 10 6 - 24 mg/dL   Creatinine, Ser 1.01 (H) 0.57 - 1.00 mg/dL   eGFR 66 >59 mL/min/1.73   BUN/Creatinine Ratio 10 9 - 23   Sodium 133 (L) 134 - 144 mmol/L   Potassium 3.9 3.5 - 5.2 mmol/L   Chloride 99 96 - 106 mmol/L   CO2 20 20 - 29 mmol/L   Calcium 8.3 (L) 8.7 - 10.2 mg/dL   Total Protein 6.9 6.0 - 8.5 g/dL   Albumin 3.4 (L) 3.8 - 4.9 g/dL   Globulin, Total 3.5 1.5 - 4.5 g/dL   Albumin/Globulin Ratio 1.0 (L) 1.2 - 2.2   Bilirubin Total 1.1 0.0 - 1.2 mg/dL   Alkaline Phosphatase 307 (H) 44 - 121 IU/L   AST 71 (H) 0 - 40 IU/L   ALT 71 (H) 0 - 32 IU/L  VITAMIN D 25 Hydroxy (Vit-D Deficiency, Fractures)     Status: None   Collection Time: 08/15/21  8:54 AM  Result Value Ref Range   Vit D, 25-Hydroxy 42.9 30.0 - 100.0 ng/mL    Comment: Vitamin D deficiency has been defined by the Institute of Medicine  and an Endocrine Society practice guideline as a level of serum 25-OH vitamin D less than 20 ng/mL (1,2). The Endocrine Society went on to further define vitamin D insufficiency as a level between 21 and 29 ng/mL (2). 1. IOM (Institute of Medicine). 2010. Dietary reference    intakes for calcium and D. [place]: The    [employer]. 2. Holick MF, Binkley Finley, Bischoff-Ferrari HA, et al.    Evaluation, treatment, and prevention of vitamin D    deficiency: an Endocrine Society clinical practice    guideline. JCEM. 2011 Jul; 96(7):1911-30.   Magnesium     Status: Abnormal   Collection Time: 08/15/21  8:54 AM  Result Value Ref Range   Magnesium 1.4 (L) 1.6 - 2.3 mg/dL  CBC With Differential     Status: Abnormal   Collection Time: 08/15/21  8:54 AM  Result Value Ref Range   WBC 4.4 3.4 - 10.8 x10E3/uL   RBC 3.36 (L) 3.77 - 5.28 x10E6/uL   Hemoglobin 11.3 11.1 - 15.9 g/dL   Hematocrit 31.4 (L) 34.0 - 46.6 %   MCV 94 79 - 97 fL   MCH 33.6 (H) 26.6 - 33.0 pg  MCHC 36.0 (H) 31.5 - 35.7 g/dL   RDW 13.6 11.7 - 15.4 %   Neutrophils 51 Not Estab. %   Lymphs 39 Not Estab. %   Monocytes 8 Not Estab. %   Eos 1 Not Estab. %   Basos 1 Not Estab. %   Neutrophils Absolute 2.3 1.4 - 7.0 x10E3/uL   Lymphocytes Absolute 1.7 0.7 - 3.1 x10E3/uL   Monocytes Absolute 0.4 0.1 - 0.9 x10E3/uL   EOS (ABSOLUTE) 0.0 0.0 - 0.4 x10E3/uL   Basophils Absolute 0.0 0.0 - 0.2 x10E3/uL   Immature Granulocytes 0 Not Estab. %   Immature Grans (Abs) 0.0 0.0 - 0.1 x10E3/uL  Hepatitis D qRT-PCR (serum)     Status: None   Collection Time: 08/15/21  8:58 AM  Result Value Ref Range   HDV qRT-PCR (serum) Not Detected Not Detected    Comment: This test was developed and its performance characteristics determined by [employer]. It has not been cleared or approved by the U.S. Food and Drug Administration.  Results should be used in conjunction with clinical findings, and should not form the sole  basis for a diagnosis or treatment decision.   TSH     Status: None   Collection Time: 08/15/21  8:58 AM  Result Value Ref Range   TSH 1.840 0.450 - 4.500 uIU/mL  Hepatitis B DNA, ultraquantitative, PCR     Status: None   Collection Time: 08/15/21  8:58 AM  Result Value Ref Range   HBV DNA SERPL PCR-ACNC HBV DNA not detected IU/mL   HBV DNA SERPL PCR-LOG IU CANCELED log10 IU/mL    Comment: Unable to calculate result since non-numeric result obtained for component test.  Result canceled by the ancillary.    Test Information: Comment     Comment: The reportable range for this assay is 10 IU/mL to 1 billion IU/mL.  CK     Status: Abnormal   Collection Time: 08/15/21  8:58 AM  Result Value Ref Range   Total CK 266 (H) 32 - 182 U/L  Copper, Urine     Status: None   Collection Time: 08/23/21  1:21 PM  Result Value Ref Range   Copper, Ur 13 Not Estab. ug/L    Comment:                                 Detection Limit = 1   Creatinine(Crt),U 0.31 0.30 - 3.00 g/L    Comment:                                 Detection Limit = 0.10   Copper / Creatinine Ratio 42 0 - 49 ug/g creat   Copper, 24H Ur 20 3 - 35 ug/24 hr  POCT HgB A1C     Status: Abnormal   Collection Time: 09/10/21  2:02 PM  Result Value Ref Range   Hemoglobin A1C 8.5 (A) 4.0 - 5.6 %   HbA1c POC (<> result, manual entry)     HbA1c, POC (prediabetic range)     HbA1c, POC (controlled diabetic range)    NuSwab Vaginitis Plus (VG+)     Status: Abnormal   Collection Time: 09/10/21  4:27 PM  Result Value Ref Range   Atopobium vaginae Low - 0 Score   BVAB 2 Low - 0 Score   Megasphaera 1 Low - 0  Score    Comment: Calculate total score by adding the 3 individual bacterial vaginosis (BV) marker scores together.  Total score is interpreted as follows: Total score 0-1: Indicates the absence of BV. Total score   2: Indeterminate for BV. Additional clinical                  data should be evaluated to establish a                   diagnosis. Total score 3-6: Indicates the presence of BV. This test was developed and its performance characteristics determined by [facility].  It has not been cleared or approved by the Food and Drug Administration.    Candida albicans, NAA Positive (A) Negative   Candida glabrata, NAA Negative Negative   Trich vag by NAA Negative Negative   Chlamydia trachomatis, NAA Negative Negative   Neisseria gonorrhoeae, NAA Negative Negative  UA/M w/rflx Culture, Routine     Status: Abnormal (Preliminary result)   Collection Time: 09/10/21  4:27 PM   Specimen: Urine   Urine  Result Value Ref Range   Specific Gravity, UA 1.027 1.005 - 1.030   pH, UA 6.0 5.0 - 7.5   Color, UA Yellow Yellow   Appearance Ur Cloudy (A) Clear   Leukocytes,UA Trace (A) Negative   Protein,UA Negative Negative/Trace   Glucose, UA 3+ (A) Negative   Ketones, UA Negative Negative   RBC, UA Negative Negative   Bilirubin, UA Negative Negative   Urobilinogen, Ur 1.0 0.2 - 1.0 mg/dL   Nitrite, UA Positive (A) Negative   Microscopic Examination See below:     Comment: Microscopic was indicated and was performed.   Urinalysis Reflex Comment     Comment: This specimen has reflexed to a Urine Culture.  Microscopic Examination     Status: Abnormal   Collection Time: 09/10/21  4:27 PM   Urine  Result Value Ref Range   WBC, UA >30 (A) 0 - 5 /hpf   RBC 0-2 0 - 2 /hpf   Epithelial Cells (non renal) 0-10 0 - 10 /hpf   Casts None seen None seen /lpf   Bacteria, UA Many (A) None seen/Few  Urine Culture, Reflex     Status: None (Preliminary result)   Collection Time: 09/10/21  4:27 PM   Urine  Result Value Ref Range   Urine Culture, Routine WILL FOLLOW   Immature Platelet Fraction     Status: Abnormal   Collection Time: 09/12/21  9:40 AM  Result Value Ref Range   Immature Platelet Fraction 13.3 (H) 1.2 - 8.6 %    Comment:        An elevated IPF indicates increased platelet production. A low platelet count with an  elevated IPF may be associated with peripheral platelet destruction (e.g. DIC, ITP) or bone marrow recovery (e.g. after chemotherapy or transplant). A low platelet count with a low or non- elevated IPF is consistent with a platelet production disorder. Performed at [facility], [address] [place], [place] [address]   CBC with Differential/Platelet     Status: Abnormal   Collection Time: 09/12/21  9:40 AM  Result Value Ref Range   WBC 5.6 4.0 - 10.5 K/uL   RBC 3.32 (L) 3.87 - 5.11 MIL/uL   Hemoglobin 11.1 (L) 12.0 - 15.0 g/dL   HCT 33.4 (L) 36.0 - 46.0 %   MCV 100.6 (H) 80.0 - 100.0 fL   MCH 33.4 26.0 - 34.0 pg  MCHC 33.2 30.0 - 36.0 g/dL   RDW 13.2 11.5 - 15.5 %   Platelets 39 (L) 150 - 400 K/uL   nRBC 0.0 0.0 - 0.2 %   Neutrophils Relative % 44 %   Neutro Abs 2.5 1.7 - 7.7 K/uL   Lymphocytes Relative 44 %   Lymphs Abs 2.5 0.7 - 4.0 K/uL   Monocytes Relative 11 %   Monocytes Absolute 0.6 0.1 - 1.0 K/uL   Eosinophils Relative 1 %   Eosinophils Absolute 0.0 0.0 - 0.5 K/uL   Basophils Relative 0 %   Basophils Absolute 0.0 0.0 - 0.1 K/uL   Immature Granulocytes 0 %   Abs Immature Granulocytes 0.01 0.00 - 0.07 K/uL    Comment: Performed at [facility], [address] [place], [place] [address]  Comprehensive metabolic panel     Status: Abnormal   Collection Time: 09/12/21  9:40 AM  Result Value Ref Range   Sodium 126 (L) 135 - 145 mmol/L   Potassium 4.0 3.5 - 5.1 mmol/L   Chloride 94 (L) 98 - 111 mmol/L   CO2 22 22 - 32 mmol/L   Glucose, Bld 419 (H) 70 - 99 mg/dL    Comment: Glucose reference range applies only to samples taken after fasting for at least 8 hours.   BUN 20 6 - 20 mg/dL   Creatinine, Ser 1.34 (H) 0.44 - 1.00 mg/dL   Calcium 8.6 (L) 8.9 - 10.3 mg/dL   Total Protein 7.4 6.5 - 8.1 g/dL   Albumin 3.0 (L) 3.5 - 5.0 g/dL   AST 57 (H) 15 - 41 U/L   ALT 57 (H) 0 - 44 U/L   Alkaline Phosphatase 249 (H) 38 - 126 U/L   Total Bilirubin 1.3 (H)  0.3 - 1.2 mg/dL   GFR, Estimated 47 (L) >60 mL/min    Comment: (NOTE) Calculated using the CKD-EPI Creatinine Equation (2021)    Anion gap 10 5 - 15    Comment: Performed at [facility], [address] [place], [place] [address]       Assessment/Plan: 1. Encounter for general adult medical examination with abnormal findings CPE performed with Pap, up-to-date on mammogram and colonoscopy.  Labs reviewed  2. Uncontrolled type 2 diabetes mellitus with hyperglycemia (HCC) - POCT HgB A1C is 8.5 today which is a significant jump from 5.3 at last check.  Patient has not been monitoring sugars regularly and will start back to doing so. Patient will also begin to work on diet and exercise again and cut back on sweets.  We will continue 20 units twice daily as before and patient may resume sliding scale at lunchtime as needed based on BG readings.  3. Essential hypertension Stable, will continue current medications.  Refill sent - amLODipine (NORVASC) 5 MG tablet; Take 1 tablet (5 mg total) by mouth daily.  Dispense: 90 tablet; Refill: 1 - losartan (COZAAR) 50 MG tablet; Take 1 tablet (50 mg total) by mouth daily.  Dispense: 90 tablet; Refill: 3  4. Acquired hypothyroidism Stable continue current dose of Synthroid - levothyroxine (SYNTHROID) 50 MCG tablet; Take 1 tablet (50 mcg total) by mouth daily before breakfast.  Dispense: 90 tablet; Refill: 1  5. Thrombocytopenia (HCC) Followed by heme-onc  6. Elevated liver enzymes Followed by GI  7. Irritable bowel syndrome with diarrhea - hyoscyamine (LEVSIN) 0.125 MG tablet; Take 1 tablet (0.125 mg total) by mouth every 6 (six) hours as needed for cramping.  Dispense: 120 tablet;  Refill: 2  8. Restless legs Improving, patient will continue with Requip and may find some improvement as well with supplementing calcium and magnesium based on lab results - rOPINIRole (REQUIP) 0.25 MG tablet; Take 1 to 2 tablets po QHS prn  Dispense: 120  tablet; Refill: 3  9. Visit for gynecologic examination Pelvic exam performed, patient declines breast exam due to recent mammography  10. Routine cervical smear - IGP, Aptima HPV  11. Screen for sexually transmitted diseases - NuSwab Vaginitis Plus (VG+)  12. Dysuria - UA/M w/rflx Culture, Routine   General Counseling: Abigail Rice verbalizes understanding of the findings of todays visit and agrees with plan of treatment. I have discussed any further diagnostic evaluation that may be needed or ordered today. We also reviewed her medications today. she has been encouraged to call the office with any questions or concerns that should arise related to todays visit.    Counseling:    Orders Placed This Encounter  Procedures   Microscopic Examination   Urine Culture, Reflex   NuSwab Vaginitis Plus (VG+)   UA/M w/rflx Culture, Routine   POCT HgB A1C    Meds ordered this encounter  Medications   Insulin Pen Needle (BD PEN NEEDLE NANO 2ND GEN) 32G X 4 MM MISC    Sig: USE AS DIRECTED    Dispense:  100 each    Refill:  3   amLODipine (NORVASC) 5 MG tablet    Sig: Take 1 tablet (5 mg total) by mouth daily.    Dispense:  90 tablet    Refill:  1   hyoscyamine (LEVSIN) 0.125 MG tablet    Sig: Take 1 tablet (0.125 mg total) by mouth every 6 (six) hours as needed for cramping.    Dispense:  120 tablet    Refill:  2   Insulin Lispro Prot & Lispro (HUMALOG 75/25 MIX) (75-25) 100 UNIT/ML Kwikpen    Sig: Inject 20 Units into the skin 2 (two) times daily with a meal.    Dispense:  15 mL    Refill:  11   levothyroxine (SYNTHROID) 50 MCG tablet    Sig: Take 1 tablet (50 mcg total) by mouth daily before breakfast.    Dispense:  90 tablet    Refill:  1   losartan (COZAAR) 50 MG tablet    Sig: Take 1 tablet (50 mg total) by mouth daily.    Dispense:  90 tablet    Refill:  3   rOPINIRole (REQUIP) 0.25 MG tablet    Sig: Take 1 to 2 tablets po QHS prn    Dispense:  120 tablet    Refill:  3     Please fill as 90 day prescription.    This patient was seen by Drema Dallas, PA-C in collaboration with Dr. Clayborn Bigness as a part of collaborative care agreement.  Total time spent:40 Minutes  Time spent includes review of chart, medications, test results, and follow up plan with the patient.     Lavera Guise, MD  Internal Medicine

## 2021-09-11 ENCOUNTER — Other Ambulatory Visit: Payer: Self-pay

## 2021-09-11 DIAGNOSIS — D696 Thrombocytopenia, unspecified: Secondary | ICD-10-CM

## 2021-09-12 ENCOUNTER — Inpatient Hospital Stay: Payer: Managed Care, Other (non HMO) | Attending: Oncology

## 2021-09-12 ENCOUNTER — Telehealth: Payer: Self-pay

## 2021-09-12 ENCOUNTER — Encounter: Payer: Self-pay | Admitting: Oncology

## 2021-09-12 ENCOUNTER — Other Ambulatory Visit: Payer: Self-pay | Admitting: Physician Assistant

## 2021-09-12 ENCOUNTER — Inpatient Hospital Stay (HOSPITAL_BASED_OUTPATIENT_CLINIC_OR_DEPARTMENT_OTHER): Payer: Managed Care, Other (non HMO) | Admitting: Oncology

## 2021-09-12 VITALS — BP 150/8 | HR 81 | Temp 97.8°F | Resp 17 | Wt 181.0 lb

## 2021-09-12 DIAGNOSIS — R7401 Elevation of levels of liver transaminase levels: Secondary | ICD-10-CM

## 2021-09-12 DIAGNOSIS — Z794 Long term (current) use of insulin: Secondary | ICD-10-CM | POA: Insufficient documentation

## 2021-09-12 DIAGNOSIS — F1090 Alcohol use, unspecified, uncomplicated: Secondary | ICD-10-CM

## 2021-09-12 DIAGNOSIS — E1165 Type 2 diabetes mellitus with hyperglycemia: Secondary | ICD-10-CM | POA: Diagnosis not present

## 2021-09-12 DIAGNOSIS — E871 Hypo-osmolality and hyponatremia: Secondary | ICD-10-CM | POA: Diagnosis not present

## 2021-09-12 DIAGNOSIS — K760 Fatty (change of) liver, not elsewhere classified: Secondary | ICD-10-CM | POA: Diagnosis not present

## 2021-09-12 DIAGNOSIS — D539 Nutritional anemia, unspecified: Secondary | ICD-10-CM | POA: Diagnosis not present

## 2021-09-12 DIAGNOSIS — E1365 Other specified diabetes mellitus with hyperglycemia: Secondary | ICD-10-CM

## 2021-09-12 DIAGNOSIS — D696 Thrombocytopenia, unspecified: Secondary | ICD-10-CM | POA: Insufficient documentation

## 2021-09-12 DIAGNOSIS — Z789 Other specified health status: Secondary | ICD-10-CM

## 2021-09-12 DIAGNOSIS — Z7289 Other problems related to lifestyle: Secondary | ICD-10-CM | POA: Diagnosis not present

## 2021-09-12 DIAGNOSIS — D649 Anemia, unspecified: Secondary | ICD-10-CM

## 2021-09-12 LAB — COMPREHENSIVE METABOLIC PANEL
ALT: 57 U/L — ABNORMAL HIGH (ref 0–44)
AST: 57 U/L — ABNORMAL HIGH (ref 15–41)
Albumin: 3 g/dL — ABNORMAL LOW (ref 3.5–5.0)
Alkaline Phosphatase: 249 U/L — ABNORMAL HIGH (ref 38–126)
Anion gap: 10 (ref 5–15)
BUN: 20 mg/dL (ref 6–20)
CO2: 22 mmol/L (ref 22–32)
Calcium: 8.6 mg/dL — ABNORMAL LOW (ref 8.9–10.3)
Chloride: 94 mmol/L — ABNORMAL LOW (ref 98–111)
Creatinine, Ser: 1.34 mg/dL — ABNORMAL HIGH (ref 0.44–1.00)
GFR, Estimated: 47 mL/min — ABNORMAL LOW (ref >60)
Glucose, Bld: 419 mg/dL — ABNORMAL HIGH (ref 70–99)
Potassium: 4 mmol/L (ref 3.5–5.1)
Sodium: 126 mmol/L — ABNORMAL LOW (ref 135–145)
Total Bilirubin: 1.3 mg/dL — ABNORMAL HIGH (ref 0.3–1.2)
Total Protein: 7.4 g/dL (ref 6.5–8.1)

## 2021-09-12 LAB — CBC WITH DIFFERENTIAL/PLATELET
Abs Immature Granulocytes: 0.01 10*3/uL (ref 0.00–0.07)
Basophils Absolute: 0 10*3/uL (ref 0.0–0.1)
Basophils Relative: 0 %
Eosinophils Absolute: 0 10*3/uL (ref 0.0–0.5)
Eosinophils Relative: 1 %
HCT: 33.4 % — ABNORMAL LOW (ref 36.0–46.0)
Hemoglobin: 11.1 g/dL — ABNORMAL LOW (ref 12.0–15.0)
Immature Granulocytes: 0 %
Lymphocytes Relative: 44 %
Lymphs Abs: 2.5 10*3/uL (ref 0.7–4.0)
MCH: 33.4 pg (ref 26.0–34.0)
MCHC: 33.2 g/dL (ref 30.0–36.0)
MCV: 100.6 fL — ABNORMAL HIGH (ref 80.0–100.0)
Monocytes Absolute: 0.6 10*3/uL (ref 0.1–1.0)
Monocytes Relative: 11 %
Neutro Abs: 2.5 10*3/uL (ref 1.7–7.7)
Neutrophils Relative %: 44 %
Platelets: 39 10*3/uL — ABNORMAL LOW (ref 150–400)
RBC: 3.32 MIL/uL — ABNORMAL LOW (ref 3.87–5.11)
RDW: 13.2 % (ref 11.5–15.5)
WBC: 5.6 10*3/uL (ref 4.0–10.5)
nRBC: 0 % (ref 0.0–0.2)

## 2021-09-12 LAB — NUSWAB VAGINITIS PLUS (VG+)
Candida albicans, NAA: POSITIVE — AB
Candida glabrata, NAA: NEGATIVE
Chlamydia trachomatis, NAA: NEGATIVE
Neisseria gonorrhoeae, NAA: NEGATIVE
Trich vag by NAA: NEGATIVE

## 2021-09-12 LAB — IMMATURE PLATELET FRACTION: Immature Platelet Fraction: 13.3 % — ABNORMAL HIGH (ref 1.2–8.6)

## 2021-09-12 MED ORDER — INSULIN LISPRO (1 UNIT DIAL) 100 UNIT/ML (KWIKPEN): PEN_INJECTOR | SUBCUTANEOUS | 11 refills | Status: DC

## 2021-09-12 NOTE — Progress Notes (Signed)
Hematology/Oncology followup note [facility] ([facility]) [phone] [phone]   Patient Care Team: Lavera Guise, MD as PCP - General (Internal Medicine)  REFERRING PROVIDER: Lavera Guise, MD  CHIEF COMPLAINTS/REASON FOR VISIT:   thrombocytopenia  HISTORY OF PRESENTING ILLNESS:  Abigail Rice is a 55 y.o. female who was seen in consultation at the request of Lavera Guise, MD for evaluation of thrombocytopenia   Reviewed patient's labs done previously.  Labs showed decreased platelet counts at 43,000, wbc 4.1 hemoglobin 10.7 Reviewed patient's previous labs. Thrombocytopenia is chronic chronic onset , since at least 2019.  Platelet count in 2019 was 1 43,000.  Starting August 2020, platelet count has been in the range of 50-60,000 No aggravating or elevated factors.  Associated symptoms or signs:  Denies weight loss, fever, chills, fatigue, night sweats.  Denies hematochezia, hematuria, hematemesis, epistaxis, black tarry stool.  easy bruising.   Context:  History of hepatitis or HIV infection, denies  Patient has a history of heavy alcohol use.  She reports that she has cut down her alcohol intake since last year and currently she drinks 3-4 beer per day.  With a history of seizure and currently on Keppra.  #Thrombocytopenia increased to 80,000 after a course of dexamethasone 40 mg daily for 4 days. Platelet count further decreased to 38,000 a few weeks later.  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  Findings are favored to be reactive in nature.FISH results is normal.  INTERVAL HISTORY Abigail Rice is a 55 y.o. female who has above history reviewed by me today presents for follow up visit for management of thrombocytopenia. Problems and complaints are listed below: + life stress, " too many things going on".  She was seen by PCP recently and had A1c checked  which was 8.5.  She feels that " this is not right as my diabetes has always been in control"  She checked blood glucose this morning and was in 400s.  She recently mixed cranberry juice with beer.  No bleeding events  Review of Systems  Constitutional:  Negative for appetite change, chills, fatigue and fever.  HENT:   Negative for hearing loss and voice change.   Eyes:  Negative for eye problems.  Respiratory:  Negative for chest tightness and cough.   Cardiovascular:  Negative for chest pain.  Gastrointestinal:  Negative for abdominal distention, abdominal pain and blood in stool.  Endocrine: Negative for hot flashes.  Genitourinary:  Negative for difficulty urinating and frequency.   Musculoskeletal:  Negative for arthralgias.  Skin:  Negative for itching and rash.  Neurological:  Negative for extremity weakness.  Hematological:  Negative for adenopathy.  Psychiatric/Behavioral:  Negative for confusion.        Feel stressed   MEDICAL HISTORY:  Past Medical History:  Diagnosis Date   Bipolar disorder ([place])    Depression    Diabetes mellitus without complication ([place])    History of seizure 2020   history of 1 seizure   Hyperlipidemia    Hypertension     SURGICAL HISTORY: Past Surgical History:  Procedure Laterality Date   CESAREAN SECTION     COLONOSCOPY WITH PROPOFOL N/A 03/29/2016   Procedure: COLONOSCOPY WITH PROPOFOL;  Surgeon: Josefine Class, MD;  Location: [facility] ENDOSCOPY;  Service: Endoscopy;  Laterality: N/A;   TUBAL LIGATION      SOCIAL HISTORY: Social History   Socioeconomic History   Marital status: Married  Spouse name: Not on file   Number of children: Not on file   Years of education: Not on file   Highest education level: Not on file  Occupational History   Not on file  Tobacco Use   Smoking status: Every Day    Packs/day: 0.50    Years: 25.00    Pack years: 12.50    Types: Cigarettes   Smokeless tobacco: Never   Tobacco comments:    6  -7  cigarettes   Vaping Use   Vaping Use: Never used  Substance and Sexual Activity   Alcohol use: Yes    Alcohol/week: 2.0 standard drinks    Types: 2 Cans of beer per week    Comment: daily    Drug use: No   Sexual activity: Not on file  Other Topics Concern   Not on file  Social History Narrative   Not on file   Social Determinants of Health   Financial Resource Strain: Not on file  Food Insecurity: Not on file  Transportation Needs: Not on file  Physical Activity: Not on file  Stress: Not on file  Social Connections: Not on file  Intimate Partner Violence: Not on file    FAMILY HISTORY: Family History  Problem Relation Age of Onset   Diabetes Mother    Hypertension Mother    Breast cancer Sister     ALLERGIES:  is allergic to ace inhibitors, ciprofloxacin, lisinopril, risperdal [risperidone], and vytorin [ezetimibe-simvastatin].  MEDICATIONS:  Current Outpatient Medications  Medication Sig Dispense Refill   amLODipine (NORVASC) 5 MG tablet Take 1 tablet (5 mg total) by mouth daily. 90 tablet 1   Apple Cider Vinegar 188 MG CAPS Take 1 capsule by mouth daily.     aspirin EC 81 MG tablet Take 81 mg by mouth daily.     cholecalciferol (VITAMIN D3) 25 MCG (1000 UNIT) tablet Take 1,000 Units by mouth daily.     glucose blood (ONETOUCH ULTRA) test strip One touch ultra blue  SUGAR TESTING 1 TIME DAILY. DX e11.65 150 strip 1   hyoscyamine (LEVSIN) 0.125 MG tablet Take 1 tablet (0.125 mg total) by mouth every 6 (six) hours as needed for cramping. 120 tablet 2   Insulin Lispro Prot & Lispro (HUMALOG 75/25 MIX) (75-25) 100 UNIT/ML Kwikpen Inject 20 Units into the skin 2 (two) times daily with a meal. 15 mL 11   Insulin Pen Needle (BD PEN NEEDLE NANO 2ND GEN) 32G X 4 MM MISC USE AS DIRECTED 100 each 3   levETIRAcetam (KEPPRA) 500 MG tablet Take 1 tablet (500 mg total) by mouth 2 (two) times daily. (Patient taking differently: Take 500 mg by mouth daily. Taking 1/2 tab QD) 180  tablet 1   levothyroxine (SYNTHROID) 50 MCG tablet Take 1 tablet (50 mcg total) by mouth daily before breakfast. 90 tablet 1   losartan (COZAAR) 50 MG tablet Take 1 tablet (50 mg total) by mouth daily. 90 tablet 3   rOPINIRole (REQUIP) 0.25 MG tablet Take 1 to 2 tablets po QHS prn 120 tablet 3   vitamin B-12 (CYANOCOBALAMIN) 100 MCG tablet Take 100 mcg by mouth daily.     insulin lispro (HUMALOG) 100 UNIT/ML KwikPen Inject into the skin. Use at lunch with sliding scale may use up to 10 units [place] 15 mL 11   No current facility-administered medications for this visit.     PHYSICAL EXAMINATION: ECOG PERFORMANCE STATUS: 0 - Asymptomatic Vitals:   09/12/21 1001  BP: Marland Kitchen)  150/8  Pulse: 81  Resp: 17  Temp: 97.8 F (36.6 C)  SpO2: 100%   Filed Weights   09/12/21 1001  Weight: 181 lb (82.1 kg)    Physical Exam Constitutional:      General: She is not in acute distress. HENT:     Head: Normocephalic and atraumatic.  Eyes:     General: No scleral icterus. Cardiovascular:     Rate and Rhythm: Normal rate and regular rhythm.     Heart sounds: Normal heart sounds.  Pulmonary:     Effort: Pulmonary effort is normal. No respiratory distress.     Breath sounds: No wheezing.  Abdominal:     General: Bowel sounds are normal. There is no distension.     Palpations: Abdomen is soft.  Musculoskeletal:        General: No deformity. Normal range of motion.     Cervical back: Normal range of motion and neck supple.  Skin:    General: Skin is warm and dry.     Findings: No erythema or rash.  Neurological:     Mental Status: She is alert and oriented to person, place, and time. Mental status is at baseline.     Cranial Nerves: No cranial nerve deficit.     Coordination: Coordination normal.  Psychiatric:     Comments: tearful     LABORATORY DATA:  I have reviewed the data as listed Lab Results  Component Value Date   WBC 5.6 09/12/2021   HGB 11.1 (L) 09/12/2021   HCT 33.4 (L)  09/12/2021   MCV 100.6 (H) 09/12/2021   PLT 39 (L) 09/12/2021   Recent Labs    11/01/20 0809 11/01/20 0809 01/17/21 1120 06/06/21 0938 08/15/21 0854 09/12/21 0940  NA 136   < > 134* 133* 133* 126*  K 4.3  --  3.5 4.2 3.9 4.0  CL 102  --  102 101 99 94*  CO2 22  --  _0 GLUCOSE 174*   < > 110* 125* 312* 419*  BUN 18   < > _1 CREATININE 1.12*  --  1.02* 1.05* 1.01* 1.34*  CALCIUM 8.8  --  8.5* 8.3* 8.3* 8.6*  GFRNONAA 56*  --  >60 >60  --  47*  GFRAA 64  --   --   --   --   --   PROT 7.4   < > 8.0 7.4 6.9 7.4  ALBUMIN 3.5*   < > 3.2* 3.3* 3.4* 3.0*  AST 61*  --  106* 67* 71* 57*  ALT 57*  --  90* 60* 71* 57*  ALKPHOS 287*  --  207* 176* 307* 249*  BILITOT 0.5   < > 1.8* 1.5* 1.1 1.3*   < > = values in this interval not displayed.    Iron/TIBC/Ferritin/ %Sat    Component Value Date/Time   IRON 156 01/17/2021 1120   TIBC 206 (L) 01/17/2021 1120   FERRITIN 595 (H) 01/17/2021 1120   IRONPCTSAT 76 (H) 01/17/2021 1120      RADIOGRAPHIC STUDIES: I have personally reviewed the radiological images as listed and agreed with the findings in the report.    12/13/2020, ultrasound abdomen limited-images are not available to me Impression, mildly increased echogenicity of the liver.  Likely fatty infiltration.  Liver unremarkable contour, echotexture.  Normal size of liver and spleen.  There are innumerable tiny scattered calcifications throughout the spleen.  ASSESSMENT & PLAN:  1.  Hyponatremia   2. Thrombocytopenia ([place])   3. Transaminitis   4. Alcohol use   5. Normocytic anemia   6. Uncontrolled other specified diabetes mellitus with hyperglycemia ([place])    #Thrombocytopenia possible due to peripheral destruction-possible ITP, chronic liver disease/alcohol consumption. Observation for now since she has no bleeding events.  In case of platelet less than 30,000, will consider rechallenge with dexamethasone, also potential rituximab treatment in the future if  needed. Patient may have future elective dental procedure and asked patient to update me a few days prior to his procedure.  I recommend CBC prior to his elective dental procedure.  # Hyponatremia, likely due to hyperglycemia.  # Uncontrolled DM- hyperglycemia with blood glucose in 400s.  She declines recommendation of ER evaluation. She says she will call PCP.  She is on insulin regimen.   # Transaminitis due to chronic alcohol use/fatty liver disease, suspect early cirrhosis. Cessation recommended.  Follow up with GI # Chronic anemia, Hb is stable  #Follow-up 6 months Cc PCP  Orders Placed This Encounter  Procedures   CBC with Differential/Platelet    Standing Status:   Future    Standing Expiration Date:   09/12/2022   Comprehensive metabolic panel    Standing Status:   Future    Standing Expiration Date:   09/12/2022    All questions were answered. The patient knows to call the clinic with any problems questions or concerns.  Cc Lavera Guise, MD   Earlie Server, MD, PhD 09/12/2021

## 2021-09-12 NOTE — Telephone Encounter (Signed)
Pt advised that we send humalog please monitor glucose closely

## 2021-09-12 NOTE — Progress Notes (Signed)
Patient here for oncology follow-up appointment, expresses no complaints or concerns at this time.    

## 2021-09-14 LAB — UA/M W/RFLX CULTURE, ROUTINE
Bilirubin, UA: NEGATIVE
Ketones, UA: NEGATIVE
Nitrite, UA: POSITIVE — AB
Protein,UA: NEGATIVE
RBC, UA: NEGATIVE
Specific Gravity, UA: 1.027 (ref 1.005–1.030)
Urobilinogen, Ur: 1 mg/dL (ref 0.2–1.0)
pH, UA: 6 (ref 5.0–7.5)

## 2021-09-14 LAB — MICROSCOPIC EXAMINATION
Casts: NONE SEEN /lpf
WBC, UA: >30 /hpf — AB (ref 0–5)

## 2021-09-14 LAB — URINE CULTURE, REFLEX

## 2021-09-17 LAB — IGP, APTIMA HPV: HPV Aptima: NEGATIVE

## 2021-09-19 ENCOUNTER — Encounter: Payer: Self-pay | Admitting: Gastroenterology

## 2021-09-20 ENCOUNTER — Ambulatory Visit: Payer: Managed Care, Other (non HMO) | Admitting: Anesthesiology

## 2021-09-20 ENCOUNTER — Other Ambulatory Visit: Payer: Self-pay

## 2021-09-20 ENCOUNTER — Encounter
Admission: RE | Disposition: A | Discharge status: Home / Self Care | Payer: Self-pay | Source: Home / Self Care | Attending: Gastroenterology

## 2021-09-20 ENCOUNTER — Encounter: Payer: Self-pay | Admitting: Gastroenterology

## 2021-09-20 ENCOUNTER — Ambulatory Visit
Admission: RE | Admit: 2021-09-20 | Discharge: 2021-09-20 | Disposition: A | Discharge status: Home / Self Care | Payer: Managed Care, Other (non HMO) | Attending: Gastroenterology | Admitting: Gastroenterology

## 2021-09-20 DIAGNOSIS — F17210 Nicotine dependence, cigarettes, uncomplicated: Secondary | ICD-10-CM | POA: Insufficient documentation

## 2021-09-20 DIAGNOSIS — Z833 Family history of diabetes mellitus: Secondary | ICD-10-CM | POA: Diagnosis not present

## 2021-09-20 DIAGNOSIS — Z79890 Hormone replacement therapy (postmenopausal): Secondary | ICD-10-CM | POA: Diagnosis not present

## 2021-09-20 DIAGNOSIS — K222 Esophageal obstruction: Secondary | ICD-10-CM | POA: Diagnosis not present

## 2021-09-20 DIAGNOSIS — Z888 Allergy status to other drugs, medicaments and biological substances status: Secondary | ICD-10-CM | POA: Insufficient documentation

## 2021-09-20 DIAGNOSIS — K7460 Unspecified cirrhosis of liver: Secondary | ICD-10-CM | POA: Insufficient documentation

## 2021-09-20 DIAGNOSIS — E119 Type 2 diabetes mellitus without complications: Secondary | ICD-10-CM | POA: Insufficient documentation

## 2021-09-20 DIAGNOSIS — Z794 Long term (current) use of insulin: Secondary | ICD-10-CM | POA: Insufficient documentation

## 2021-09-20 DIAGNOSIS — Z7982 Long term (current) use of aspirin: Secondary | ICD-10-CM | POA: Insufficient documentation

## 2021-09-20 DIAGNOSIS — Z8249 Family history of ischemic heart disease and other diseases of the circulatory system: Secondary | ICD-10-CM | POA: Diagnosis not present

## 2021-09-20 DIAGNOSIS — K7030 Alcoholic cirrhosis of liver without ascites: Secondary | ICD-10-CM

## 2021-09-20 DIAGNOSIS — Z803 Family history of malignant neoplasm of breast: Secondary | ICD-10-CM | POA: Diagnosis not present

## 2021-09-20 DIAGNOSIS — Z881 Allergy status to other antibiotic agents status: Secondary | ICD-10-CM | POA: Insufficient documentation

## 2021-09-20 DIAGNOSIS — Z79899 Other long term (current) drug therapy: Secondary | ICD-10-CM | POA: Diagnosis not present

## 2021-09-20 HISTORY — PX: ESOPHAGOGASTRODUODENOSCOPY (EGD) WITH PROPOFOL: SHX5813

## 2021-09-20 LAB — GLUCOSE, CAPILLARY: Glucose-Capillary: 163 mg/dL — ABNORMAL HIGH (ref 70–99)

## 2021-09-20 SURGERY — ESOPHAGOGASTRODUODENOSCOPY (EGD) WITH PROPOFOL
Anesthesia: General

## 2021-09-20 MED ORDER — PROPOFOL 10 MG/ML IV BOLUS
INTRAVENOUS | Status: DC | PRN
  Administered 2021-09-20 (×2): 20 mg via INTRAVENOUS
  Administered 2021-09-20: 80 mg via INTRAVENOUS

## 2021-09-20 MED ORDER — DEXMEDETOMIDINE HCL IN NACL 200 MCG/50ML IV SOLN
INTRAVENOUS | Status: DC | PRN
Start: 2021-09-20 — End: 2021-09-20
  Administered 2021-09-20: 12 ug via INTRAVENOUS
  Administered 2021-09-20: 8 ug via INTRAVENOUS

## 2021-09-20 MED ORDER — SODIUM CHLORIDE 0.9 % IV SOLN: INTRAVENOUS | Status: DC

## 2021-09-20 NOTE — Anesthesia Postprocedure Evaluation (Signed)
Anesthesia Post Note  Patient: Abigail Rice  Procedure(s) Performed: ESOPHAGOGASTRODUODENOSCOPY (EGD) WITH PROPOFOL  Patient location during evaluation: Endoscopy Anesthesia Type: General Level of consciousness: awake and alert Pain management: pain level controlled Vital Signs Assessment: post-procedure vital signs reviewed and stable Respiratory status: spontaneous breathing, nonlabored ventilation, respiratory function stable and patient connected to nasal cannula oxygen Cardiovascular status: blood pressure returned to baseline and stable Postop Assessment: no apparent nausea or vomiting Anesthetic complications: no   No notable events documented.   Last Vitals:  Vitals:   09/20/21 1244 09/20/21 1245  BP: 100/65 100/65  Pulse:  64  Resp:  20  Temp:    SpO2:  100%    Last Pain:  Vitals:   09/20/21 1221  TempSrc:   PainSc: 0-No pain                 Precious Haws Candyce Gambino

## 2021-09-20 NOTE — H&P (Signed)
Jonathon Bellows, MD [address] [place], [place], [place], [address] [address], [place], [place], [place], [address] Phone: [phone]  Fax: [phone]  Primary Care Physician:  Lavera Guise, MD   Pre-Procedure History & Physical: HPI:  Abigail Rice is a 55 y.o. female is here for an endoscopy    Past Medical History:  Diagnosis Date   Bipolar disorder ([place])    Depression    Diabetes mellitus without complication ([place])    History of seizure 2020   history of 1 seizure   Hyperlipidemia    Hypertension     Past Surgical History:  Procedure Laterality Date   CESAREAN SECTION     COLONOSCOPY WITH PROPOFOL N/A 03/29/2016   Procedure: COLONOSCOPY WITH PROPOFOL;  Surgeon: Josefine Class, MD;  Location: [facility] ENDOSCOPY;  Service: Endoscopy;  Laterality: N/A;   TUBAL LIGATION      Prior to Admission medications   Medication Sig Start Date End Date Taking? Authorizing Provider  amLODipine (NORVASC) 5 MG tablet Take 1 tablet (5 mg total) by mouth daily. 09/10/21  Yes McDonough, Lauren K, PA-C  Apple Cider Vinegar 188 MG CAPS Take 1 capsule by mouth daily.   Yes [provider]  aspirin EC 81 MG tablet Take 81 mg by mouth daily.   Yes [provider]  cholecalciferol (VITAMIN D3) 25 MCG (1000 UNIT) tablet Take 1,000 Units by mouth daily.   Yes [provider]  hyoscyamine (LEVSIN) 0.125 MG tablet Take 1 tablet (0.125 mg total) by mouth every 6 (six) hours as needed for cramping. 09/10/21  Yes McDonough, Lauren K, PA-C  insulin lispro (HUMALOG) 100 UNIT/ML KwikPen Inject into the skin. Use at lunch with sliding scale may use up to 10 units [place] 09/12/21  Yes McDonough, Lauren K, PA-C  Insulin Lispro Prot & Lispro (HUMALOG 75/25 MIX) (75-25) 100 UNIT/ML Kwikpen Inject 20 Units into the skin 2 (two) times daily with a meal. 09/10/21  Yes McDonough, Lauren K, PA-C  levETIRAcetam (KEPPRA) 500 MG tablet Take 1 tablet (500 mg total) by mouth 2 (two) times  daily. Patient taking differently: Take 500 mg by mouth daily. Taking 1/2 tab QD 10/09/20  Yes Boscia, Greer Ee, NP  levothyroxine (SYNTHROID) 50 MCG tablet Take 1 tablet (50 mcg total) by mouth daily before breakfast. 09/10/21  Yes McDonough, Lauren K, PA-C  losartan (COZAAR) 50 MG tablet Take 1 tablet (50 mg total) by mouth daily. 09/10/21  Yes McDonough, Lauren K, PA-C  rOPINIRole (REQUIP) 0.25 MG tablet Take 1 to 2 tablets po QHS prn 09/10/21  Yes McDonough, Lauren K, PA-C  vitamin B-12 (CYANOCOBALAMIN) 100 MCG tablet Take 100 mcg by mouth daily.   Yes [provider]  glucose blood (ONETOUCH ULTRA) test strip One touch ultra blue  SUGAR TESTING 1 TIME DAILY. DX e11.65 12/27/20   Lavera Guise, MD  Insulin Pen Needle (BD PEN NEEDLE NANO 2ND GEN) 32G X 4 MM MISC USE AS DIRECTED 09/10/21   McDonough, Si Gaul, PA-C    Allergies as of 09/04/2021 - Review Complete 07/25/2021  Allergen Reaction Noted   Ace inhibitors  03/28/2016   Ciprofloxacin  08/16/2019   Lisinopril  01/23/2018   Risperdal [risperidone]  01/17/2021   Vytorin [ezetimibe-simvastatin]  03/28/2016    Family History  Problem Relation Age of Onset   Diabetes Mother    Hypertension Mother    Breast cancer Sister     Social History  Socioeconomic History   Marital status: Married    Spouse name: Not on file   Number of children: Not on file   Years of education: Not on file   Highest education level: Not on file  Occupational History   Not on file  Tobacco Use   Smoking status: Every Day    Packs/day: 0.50    Years: 25.00    Pack years: 12.50    Types: Cigarettes   Smokeless tobacco: Never   Tobacco comments:    6 -7  cigarettes   Vaping Use   Vaping Use: Never used  Substance and Sexual Activity   Alcohol use: Yes    Alcohol/week: 2.0 standard drinks    Types: 2 Cans of beer per week    Comment: daily    Drug use: No   Sexual activity: Not on file  Other Topics Concern   Not on file  Social  History Narrative   Not on file   Social Determinants of Health   Financial Resource Strain: Not on file  Food Insecurity: Not on file  Transportation Needs: Not on file  Physical Activity: Not on file  Stress: Not on file  Social Connections: Not on file  Intimate Partner Violence: Not on file    Review of Systems: See HPI, otherwise negative ROS  Physical Exam: BP 121/81   Pulse 73   Temp (!) 96.8 F (36 C) (Temporal)   Resp 18   Ht 5\' 5"  (1.651 m)   Wt 81.6 kg   LMP 11/07/2015 (Approximate)   SpO2 100%   BMI 29.95 kg/m  General:   Alert,  pleasant and cooperative in NAD Head:  Normocephalic and atraumatic. Neck:  Supple; no masses or thyromegaly. Lungs:  Clear throughout to auscultation, normal respiratory effort.    Heart:  +S1, +S2, Regular rate and rhythm, No edema. Abdomen:  Soft, nontender and nondistended. Normal bowel sounds, without guarding, and without rebound.   Neurologic:  Alert and  oriented x4;  grossly normal neurologically.  Impression/Plan: Abigail Rice is here for an endoscopy  to be performed for  evaluation of esophageal varices    Risks, benefits, limitations, and alternatives regarding endoscopy have been reviewed with the patient.  Questions have been answered.  All parties agreeable.   Jonathon Bellows, MD  09/20/2021, 12:03 PM

## 2021-09-20 NOTE — Op Note (Signed)
[facility] Gastroenterology Patient Name: Abigail Rice Procedure Date: 09/20/2021 12:03 PM MRN: [id number] Account #: [id number] Date of Birth: February 01, 1966 Admit Type: Outpatient Age: 55 Room: [facility] ENDO ROOM 3 Gender: Female Note Status: Finalized Instrument Name: Michaelle Birks [id number] Procedure:             Upper GI endoscopy Indications:           Cirrhosis rule out esophageal varices Providers:             Jonathon Bellows MD, MD Referring MD:          Lavera Guise, MD (Referring MD) Medicines:             Monitored Anesthesia Care Complications:         No immediate complications. Procedure:             Pre-Anesthesia Assessment:                        - Prior to the procedure, a History and Physical was                         performed, and patient medications, allergies and                         sensitivities were reviewed. The patient's tolerance                         of previous anesthesia was reviewed.                        - The risks and benefits of the procedure and the                         sedation options and risks were discussed with the                         patient. All questions were answered and informed                         consent was obtained.                        - ASA Grade Assessment: III - A patient with severe                         systemic disease.                        After obtaining informed consent, the endoscope was                         passed under direct vision. Throughout the procedure,                         the patient's blood pressure, pulse, and oxygen                         saturations were monitored continuously. The Endoscope  was introduced through the mouth, and advanced to the                         third part of duodenum. The upper GI endoscopy was                         accomplished with ease. The patient tolerated the                         procedure well. Findings:       The examined duodenum was normal.      The stomach was normal.      The cardia and gastric fundus were normal on retroflexion.      A non-obstructing Schatzki ring was found in the lower third of the       esophagus.      The cardia and gastric fundus were normal on retroflexion. Impression:            - Normal examined duodenum.                        - Normal stomach.                        - Non-obstructing Schatzki ring.                        - No specimens collected. Recommendation:        - Discharge patient to home (with escort).                        - Resume previous diet.                        - Continue present medications.                        - Repeat upper endoscopy in 3 years for surveillance. Procedure Code(s):     --- Professional ---                        [phone], Esophagogastroduodenoscopy, flexible,                         transoral; diagnostic, including collection of                         specimen(s) by brushing or washing, when performed                         (separate procedure) Diagnosis Code(s):     --- Professional ---                        K22.2, Esophageal obstruction                        K74.60, Unspecified cirrhosis of liver CPT copyright 2019 American Medical Association. All rights reserved. The codes documented in this report are preliminary and upon coder review may  be revised to meet current compliance requirements. Jonathon Bellows, MD Jonathon Bellows MD, MD 09/20/2021 12:17:36 PM This report has been signed electronically. Number  of Addenda: 0 Note Initiated On: 09/20/2021 12:03 PM Estimated Blood Loss:  Estimated blood loss: none.      [facility]

## 2021-09-20 NOTE — Anesthesia Preprocedure Evaluation (Signed)
Anesthesia Evaluation  Patient identified by MRN, date of birth, ID band Patient awake    Reviewed: Allergy & Precautions, NPO status , Patient's Chart, lab work & pertinent test results  History of Anesthesia Complications Negative for: history of anesthetic complications  Airway Mallampati: III  TM Distance: >3 FB Neck ROM: full    Dental  (+) Chipped, Poor Dentition, Missing, Loose   Pulmonary neg shortness of breath, Current Smoker and Patient abstained from smoking.,    Pulmonary exam normal        Cardiovascular Exercise Tolerance: Good hypertension, Normal cardiovascular exam     Neuro/Psych Seizures -, Well Controlled,  PSYCHIATRIC DISORDERS negative psych ROS   GI/Hepatic negative GI ROS, Neg liver ROS,   Endo/Other  diabetes, Type 2Hypothyroidism   Renal/GU negative Renal ROS  negative genitourinary   Musculoskeletal   Abdominal   Peds  Hematology negative hematology ROS (+)   Anesthesia Other Findings Past Medical History: No date: Bipolar disorder ([place]) No date: Depression No date: Diabetes mellitus without complication ([place]) 2094: History of seizure     Comment:  history of 1 seizure No date: Hyperlipidemia No date: Hypertension  Past Surgical History: No date: CESAREAN SECTION 03/29/2016: COLONOSCOPY WITH PROPOFOL; N/A     Comment:  Procedure: COLONOSCOPY WITH PROPOFOL;  Surgeon: Josefine Class, MD;  Location: [facility] ENDOSCOPY;  Service:               Endoscopy;  Laterality: N/A; No date: TUBAL LIGATION  BMI    Body Mass Index: 29.95 kg/m      Reproductive/Obstetrics negative OB ROS                             Anesthesia Physical Anesthesia Plan  ASA: 3  Anesthesia Plan: General   Post-op Pain Management:    Induction: Intravenous  PONV Risk Score and Plan: Propofol infusion and TIVA  Airway Management Planned: Natural Airway and Nasal  Cannula  Additional Equipment:   Intra-op Plan:   Post-operative Plan:   Informed Consent: I have reviewed the patients History and Physical, chart, labs and discussed the procedure including the risks, benefits and alternatives for the proposed anesthesia with the patient or authorized representative who has indicated his/her understanding and acceptance.     Dental Advisory Given  Plan Discussed with: Anesthesiologist, CRNA and Surgeon  Anesthesia Plan Comments: (Loose tooth.  Patient consented for damage and or extraction PRN for airway protection.  Patient voiced understanding.  Patient consented for risks of anesthesia including but not limited to:  - adverse reactions to medications - risk of airway placement if required - damage to eyes, teeth, lips or other oral mucosa - nerve damage due to positioning  - sore throat or hoarseness - Damage to heart, brain, nerves, lungs, other parts of body or loss of life  Patient voiced understanding.)        Anesthesia Quick Evaluation

## 2021-09-20 NOTE — Transfer of Care (Signed)
Immediate Anesthesia Transfer of Care Note  Patient: Abigail Rice  Procedure(s) Performed: ESOPHAGOGASTRODUODENOSCOPY (EGD) WITH PROPOFOL  Patient Location: PACU and Endoscopy Unit  Anesthesia Type:General  Level of Consciousness: drowsy and patient cooperative  Airway & Oxygen Therapy: Patient Spontanous Breathing  Post-op Assessment: Report given to RN and Post -op Vital signs reviewed and stable  Post vital signs: Reviewed and stable  Last Vitals:  Vitals Value Taken Time  BP 90/68 09/20/21 1221  Temp    Pulse 66 09/20/21 1221  Resp 16 09/20/21 1221  SpO2 98 % 09/20/21 1221  Vitals shown include unvalidated device data.  Last Pain:  Vitals:   09/20/21 1122  TempSrc: Temporal  PainSc: 0-No pain         Complications: No notable events documented.

## 2021-09-21 ENCOUNTER — Encounter: Payer: Self-pay | Admitting: Gastroenterology

## 2021-10-03 ENCOUNTER — Other Ambulatory Visit: Payer: Self-pay | Admitting: Physician Assistant

## 2021-10-03 ENCOUNTER — Other Ambulatory Visit: Payer: Self-pay

## 2021-10-03 DIAGNOSIS — N3000 Acute cystitis without hematuria: Secondary | ICD-10-CM

## 2021-10-03 MED ORDER — NITROFURANTOIN MONOHYD MACRO 100 MG PO CAPS: ORAL_CAPSULE | ORAL | 0 refills | Status: DC

## 2021-10-03 MED ORDER — FLUCONAZOLE 150 MG PO TABS: ORAL_TABLET | ORAL | 0 refills | Status: DC

## 2021-10-03 NOTE — Telephone Encounter (Signed)
Spoke to pt and informed her of pap results and also medication was sent for UTI that UA showed from last visit.  Also sent diflucan to pharmacy for yeast results on Pap.

## 2021-10-19 ENCOUNTER — Other Ambulatory Visit: Payer: Self-pay | Admitting: Nurse Practitioner

## 2021-10-19 DIAGNOSIS — R569 Unspecified convulsions: Secondary | ICD-10-CM

## 2021-10-24 ENCOUNTER — Other Ambulatory Visit: Payer: Self-pay

## 2021-10-24 DIAGNOSIS — E039 Hypothyroidism, unspecified: Secondary | ICD-10-CM

## 2021-10-24 MED ORDER — ONETOUCH ULTRA VI STRP: ORAL_STRIP | 1 refills | Status: DC

## 2021-10-24 MED ORDER — LEVOTHYROXINE SODIUM 50 MCG PO TABS
50.0000 ug | ORAL_TABLET | Freq: Every day | ORAL | 1 refills | Status: DC
Start: 2021-10-24 — End: 2021-12-06

## 2021-10-31 ENCOUNTER — Encounter: Payer: Self-pay | Admitting: Gastroenterology

## 2021-10-31 ENCOUNTER — Ambulatory Visit (INDEPENDENT_AMBULATORY_CARE_PROVIDER_SITE_OTHER): Payer: Managed Care, Other (non HMO) | Admitting: Gastroenterology

## 2021-10-31 DIAGNOSIS — R748 Abnormal levels of other serum enzymes: Secondary | ICD-10-CM

## 2021-10-31 DIAGNOSIS — R768 Other specified abnormal immunological findings in serum: Secondary | ICD-10-CM | POA: Diagnosis not present

## 2021-10-31 DIAGNOSIS — K7030 Alcoholic cirrhosis of liver without ascites: Secondary | ICD-10-CM | POA: Diagnosis not present

## 2021-10-31 DIAGNOSIS — Z23 Encounter for immunization: Secondary | ICD-10-CM

## 2021-10-31 DIAGNOSIS — R7989 Other specified abnormal findings of blood chemistry: Secondary | ICD-10-CM

## 2021-10-31 DIAGNOSIS — F1010 Alcohol abuse, uncomplicated: Secondary | ICD-10-CM

## 2021-10-31 NOTE — Progress Notes (Signed)
Jonathon Bellows MD, MRCP(U.K) [address]  [employer]  [place], [place] [address]  Main: [phone]  Fax: [phone]   Primary Care Physician: Lavera Guise, MD  Primary Gastroenterologist:  Dr. Jonathon Bellows   Chief complaint: Follow-up for abnormal LFTs and alcohol abuse   HPI: ZENAB GRONEWOLD is a 55 y.o. female   Summary of history :  Initially referred and seen on 07/25/2021 for elevated LFTs.  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 History of heavy alcohol consumption 3-4 beers per day.  Differentials being considered are ITP and chronic alcohol consumption/liver disease.  Undergone a bone marrow biopsy in March   She did  drink alcohol on days she is not working.  She was trying to cut down.  She has not sought any official help.  Denies any illegal drug use., military service, blood transfusion.  No family history of liver disease.  Denies any tattoos.  .   Right upper quadrant ultrasound in December 2021 showed mild increased echogenicity of the liver likely fatty infiltration.  06/06/2021: Hemoglobin 11.2 g with a platelet count of 40 MCV of 99.7, albumin 3.3 alkaline phosphatase of 176, AST of 67, ALT of 60 hemochromatosis genetic testing showed no mutation.  HIV testing was negative acute hepatitis panel showed no active hepatitis B surface antigen or hepatitis C virus antibody.  B12 levels was normal in January 2022 iron studies showed increased iron percentage saturation of 76% and a ferritin of 595  Interval history   07/25/2021-10/31/2021  08/10/2021: Ultrasound right upper quadrant showed lobular hepatic contour with nodularity suspicious for early cirrhosis.   07/25/2021: Elevated CK2 96, hepatitis B E antigen positive not immune to hepatitis A ceruloplasmin low otherwise autoimmune screen, celiac serology was negative.  Elevated IgE   08/15/2021 TSH normal.  CMP shows elevated alkaline phosphatase 307 along with transaminases hemoglobin 11.3 g hepatitis  delta PCR not detected, hepatitis B DNA not detected CK elevated at 24-hour urinary copper excretion within normal limits   09/20/2021: EGD: Schatzki's ring found in the lower third of the esophagus nonobstructing.  No esophageal varices seen  She continues to drink alcohol although has cut down.  No other complaints.  Current Outpatient Medications  Medication Sig Dispense Refill   amLODipine (NORVASC) 5 MG tablet Take 1 tablet (5 mg total) by mouth daily. 90 tablet 1   Apple Cider Vinegar 188 MG CAPS Take 1 capsule by mouth daily.     aspirin EC 81 MG tablet Take 81 mg by mouth daily.     cholecalciferol (VITAMIN D3) 25 MCG (1000 UNIT) tablet Take 1,000 Units by mouth daily.     fluconazole (DIFLUCAN) 150 MG tablet Take 1 tablet by mouth once and may repeat in 3 days if symptoms persist 3 tablet 0   glucose blood (ONETOUCH ULTRA) test strip Use as directed 5 times with sliding scale DX E11.65 300 strip 1   hyoscyamine (LEVSIN) 0.125 MG tablet Take 1 tablet (0.125 mg total) by mouth every 6 (six) hours as needed for cramping. 120 tablet 2   insulin lispro (HUMALOG) 100 UNIT/ML KwikPen Inject into the skin. Use at lunch with sliding scale may use up to 10 units [place] 15 mL 11   Insulin Lispro Prot & Lispro (HUMALOG 75/25 MIX) (75-25) 100 UNIT/ML Kwikpen Inject 20 Units into the skin 2 (two) times daily with a meal. 15 mL 11   Insulin Pen Needle (BD PEN NEEDLE NANO 2ND GEN) 32G  X 4 MM MISC USE AS DIRECTED 100 each 3   levETIRAcetam (KEPPRA) 500 MG tablet Take 1 tablet (500 mg total) by mouth 2 (two) times daily. (Patient taking differently: Take 500 mg by mouth daily. Taking 1/2 tab QD) 180 tablet 1   levothyroxine (SYNTHROID) 50 MCG tablet Take 1 tablet (50 mcg total) by mouth daily before breakfast. 90 tablet 1   losartan (COZAAR) 50 MG tablet Take 1 tablet (50 mg total) by mouth daily. 90 tablet 3   nitrofurantoin, macrocrystal-monohydrate, (MACROBID) 100 MG capsule Take 1 cap twice per day for  10 days. 20 capsule 0   rOPINIRole (REQUIP) 0.25 MG tablet Take 1 to 2 tablets po QHS prn 120 tablet 3   vitamin B-12 (CYANOCOBALAMIN) 100 MCG tablet Take 100 mcg by mouth daily.     No current facility-administered medications for this visit.    Allergies as of 10/31/2021 - Review Complete 10/31/2021  Allergen Reaction Noted   Ace inhibitors  03/28/2016   Ciprofloxacin  08/16/2019   Lisinopril  01/23/2018   Risperdal [risperidone]  01/17/2021   Vytorin [ezetimibe-simvastatin]  03/28/2016    ROS:  General: Negative for anorexia, weight loss, fever, chills, fatigue, weakness. ENT: Negative for hoarseness, difficulty swallowing , nasal congestion. CV: Negative for chest pain, angina, palpitations, dyspnea on exertion, peripheral edema.  Respiratory: Negative for dyspnea at rest, dyspnea on exertion, cough, sputum, wheezing.  GI: See history of present illness. GU:  Negative for dysuria, hematuria, urinary incontinence, urinary frequency, nocturnal urination.  Endo: Negative for unusual weight change.    Physical Examination:   BP 134/85   Pulse 91   Temp 99.5 F (37.5 C) (Oral)   Ht _0  (1.651 m)   Wt 182 lb 3.2 oz (82.6 kg)   LMP 11/07/2015 (Approximate)   BMI 30.32 kg/m   General: Well-nourished, well-developed in no acute distress.  Eyes: No icterus. Conjunctivae pink. Neuro: Alert and oriented x 3.  Grossly intact. Skin: Warm and dry, no jaundice.   Psych: Alert and cooperative, normal mood and affect.   Imaging Studies: No results found.  Assessment and Plan:   Florabelle K Plantz is a 55 y.o. y/o female here to follow-up for abnormal LFTs history of excess alcohol consumption.  Follows with Dr. Tasia Catchings in hematology.  Underwent a bone marrow biopsy.  Recent ultrasound shows features of possible cirrhosis.  She has had multiple labs which are abnormal but which are not diagnostic of any condition.  Needs further evaluation.  It is very possible that the elevated  immunoglobulins, ferritin, CK may be due to the effects of alcohol    Plan 1.  Serum ceruloplasmin was low but 24-hour urinary copper was in the normal range.  We will obtain ophthalmology evaluation for [facility] rings.  I will repeat ceruloplasmin.  Based on the results may need to obtain genetic testing for her liver biopsy down the road.  Elevated immunoglobulin levels we will repeat.  CK was elevated previously we will repeat if elevated will need referral to rheumatology.  Check INR to calculate meld score   2.  Right upper quadrant ultrasound in February 2022 to screen for [facility]   3.  Strongly suggest her to avoid all alcohol consumption and join alcoholic Anonymous.  I have clearly explained to her that she would die if she does not do so and continues to drink alcohol.  I have mentioned this on her last visit and I reiterated the same today.,  She  continues to drink alcohol   4.  EGD to screen for esophageal varices in 09/11/2024  5.  Repeat hepatitis B serology as the antigen is positive likely false positive.  If negative will likely need vaccination for hepatitis a and B together.  I will wait for the result before vaccination is recommended.  6.  Elevated ferritin and transferrin saturation we will check HFE gene mutation to rule out hemochromatosis    Dr Jonathon Bellows  MD,MRCP [facility]) Follow up in 8 to 12 weeks

## 2021-10-31 NOTE — Addendum Note (Signed)
Addended by: Wayna Chalet on: 10/31/2021 02:50 PM   Modules accepted: Orders

## 2021-10-31 NOTE — Patient Instructions (Addendum)
We will refer you to the Ophthalmologist per Dr. Irene Pap request.  In March 04, 2022 you will need to have an ultrasound of your abdomen (Please do not eat or drink after midnight).

## 2021-11-01 ENCOUNTER — Telehealth: Payer: Self-pay

## 2021-11-01 ENCOUNTER — Other Ambulatory Visit: Payer: Self-pay

## 2021-11-01 ENCOUNTER — Telehealth: Payer: Self-pay | Admitting: Internal Medicine

## 2021-11-01 DIAGNOSIS — R748 Abnormal levels of other serum enzymes: Secondary | ICD-10-CM

## 2021-11-01 NOTE — Telephone Encounter (Signed)
-----   Message from Jonathon Bellows, MD sent at 11/01/2021  7:47 AM EST ----- Regarding: FW: Hi   Maritza is off , ck very high stop any statin. Inform pcp and refer rheum ----- Message ----- From: Interface, [facility] Lab Results In Sent: 11/01/2021   7:38 AM EST To: Jonathon Bellows, MD

## 2021-11-01 NOTE — Telephone Encounter (Signed)
Pt notified of lab results. Referral sent to Rheumatology. Labs faxed back to PCP for review.

## 2021-11-06 LAB — CERULOPLASMIN: Ceruloplasmin: 16.7 mg/dL — ABNORMAL LOW (ref 19.0–39.0)

## 2021-11-06 LAB — COMPREHENSIVE METABOLIC PANEL
ALT: 126 IU/L — ABNORMAL HIGH (ref 0–32)
AST: 170 IU/L — ABNORMAL HIGH (ref 0–40)
Albumin/Globulin Ratio: 0.9 — ABNORMAL LOW (ref 1.2–2.2)
Albumin: 3.3 g/dL — ABNORMAL LOW (ref 3.8–4.9)
Alkaline Phosphatase: 185 IU/L — ABNORMAL HIGH (ref 44–121)
BUN/Creatinine Ratio: 9 (ref 9–23)
BUN: 10 mg/dL (ref 6–24)
Bilirubin Total: 2.2 mg/dL — ABNORMAL HIGH (ref 0.0–1.2)
CO2: 21 mmol/L (ref 20–29)
Calcium: 8.5 mg/dL — ABNORMAL LOW (ref 8.7–10.2)
Chloride: 101 mmol/L (ref 96–106)
Creatinine, Ser: 1.17 mg/dL — ABNORMAL HIGH (ref 0.57–1.00)
Globulin, Total: 3.5 g/dL (ref 1.5–4.5)
Glucose: 147 mg/dL — ABNORMAL HIGH (ref 70–99)
Potassium: 4.1 mmol/L (ref 3.5–5.2)
Sodium: 135 mmol/L (ref 134–144)
Total Protein: 6.8 g/dL (ref 6.0–8.5)
eGFR: 55 mL/min/{1.73_m2} — ABNORMAL LOW (ref >59)

## 2021-11-06 LAB — PROTIME-INR
INR: 1.5 — ABNORMAL HIGH (ref 0.9–1.2)
Prothrombin Time: 15.5 s — ABNORMAL HIGH (ref 9.1–12.0)

## 2021-11-06 LAB — HEPATITIS B E ANTIGEN: Hep B E Ag: NEGATIVE

## 2021-11-06 LAB — HEPATITIS B CORE ANTIBODY, TOTAL: Hep B Core Total Ab: NEGATIVE

## 2021-11-06 LAB — HEPATITIS B SURFACE ANTIGEN: Hepatitis B Surface Ag: NEGATIVE

## 2021-11-06 LAB — IMMUNOGLOBULINS A/E/G/M, SERUM
IgA/Immunoglobulin A, Serum: 504 mg/dL — ABNORMAL HIGH (ref 87–352)
IgE (Immunoglobulin E), Serum: 1766 IU/mL — ABNORMAL HIGH (ref 6–495)
IgG (Immunoglobin G), Serum: 2228 mg/dL — ABNORMAL HIGH (ref 586–1602)
IgM (Immunoglobulin M), Srm: 48 mg/dL (ref 26–217)

## 2021-11-06 LAB — CK: Total CK: 1332 U/L (ref 32–182)

## 2021-11-08 LAB — HEMOCHROMATOSIS DNA-PCR(C282Y,H63D)

## 2021-11-14 ENCOUNTER — Other Ambulatory Visit: Payer: Self-pay | Admitting: Physician Assistant

## 2021-11-14 DIAGNOSIS — K580 Irritable bowel syndrome with diarrhea: Secondary | ICD-10-CM

## 2021-11-22 ENCOUNTER — Telehealth: Payer: Self-pay

## 2021-11-22 DIAGNOSIS — R7989 Other specified abnormal findings of blood chemistry: Secondary | ICD-10-CM

## 2021-11-22 NOTE — Telephone Encounter (Signed)
-----   Message from Jonathon Bellows, MD sent at 11/19/2021 12:18 PM EST ----- Can we have her LFTs checked again along with INR as well as GGT.Check if she is taking any over-the-counter medications.  Check if she is taking any acetaminophen over-the-counter.  Ensure she has stopped taking the Diflucan.

## 2021-11-22 NOTE — Telephone Encounter (Signed)
Called patient and she stated that she is not currently taking any OTC medications, Acetaminophen nor Diflucan. Patient agreed on coming in to have her labs drawn until Wednesday of next week since she does not have transportation.

## 2021-12-06 ENCOUNTER — Other Ambulatory Visit: Payer: Self-pay

## 2021-12-06 ENCOUNTER — Ambulatory Visit (INDEPENDENT_AMBULATORY_CARE_PROVIDER_SITE_OTHER): Payer: Managed Care, Other (non HMO) | Admitting: Physician Assistant

## 2021-12-06 ENCOUNTER — Encounter: Payer: Self-pay | Admitting: Physician Assistant

## 2021-12-06 VITALS — BP 110/87 | HR 91 | Temp 97.8°F | Resp 16 | Ht 65.0 in | Wt 180.0 lb

## 2021-12-06 DIAGNOSIS — I10 Essential (primary) hypertension: Secondary | ICD-10-CM

## 2021-12-06 DIAGNOSIS — E1165 Type 2 diabetes mellitus with hyperglycemia: Secondary | ICD-10-CM

## 2021-12-06 DIAGNOSIS — K7030 Alcoholic cirrhosis of liver without ascites: Secondary | ICD-10-CM

## 2021-12-06 DIAGNOSIS — E039 Hypothyroidism, unspecified: Secondary | ICD-10-CM

## 2021-12-06 LAB — POCT GLYCOSYLATED HEMOGLOBIN (HGB A1C): Hemoglobin A1C: 6.6 % — AB (ref 4.0–5.6)

## 2021-12-06 MED ORDER — LEVOTHYROXINE SODIUM 50 MCG PO TABS: 50.0000 ug | ORAL_TABLET | Freq: Every day | ORAL | 1 refills | Status: DC

## 2021-12-06 MED ORDER — BD PEN NEEDLE NANO 2ND GEN 32G X 4 MM MISC: 3 refills | Status: DC

## 2021-12-06 MED ORDER — PNEUMOCOCCAL 20-VAL CONJ VACC 0.5 ML IM SUSY: 0.5000 mL | PREFILLED_SYRINGE | INTRAMUSCULAR | 0 refills | Status: AC

## 2021-12-06 NOTE — Progress Notes (Addendum)
[facility] [place], [place] [address]  Internal MEDICINE  Office Visit Note  Patient Name: Abigail Rice  [id number]  [id number]  Date of Service: 12/11/2021  Chief Complaint  Patient presents with   Follow-up   Diabetes   Hyperlipidemia   Hypertension    HPI Pt is here for routine follow up  -BG have been fluctauting 112-300 a few mornings when she has eaten something she knows she shouldn't have the night before -she has been taking 20units mixed BID, but also was doing sliding scale TID. Discussed that she should be doing sliding scale at lunch when she doesn't already take the mixed insulin.  -Discussed changing to 22units mixed BID once she runs out of current pen and sliding scale only at lunch. -pt ws referred to rheumatology after lab draw with GI. She states she had leg cramps in both legs that night before labs done and found high reading, but doesn't want to go to rheumatology. Discussed contacting GI to discuss further and to consider visit with rheumatology for at least an initial evaluation if still deemed necessary. If rheumatologist that she was referred to is too far away may see if closer option.  Current Medication: Outpatient Encounter Medications as of 12/06/2021  Medication Sig   amLODipine (NORVASC) 5 MG tablet Take 1 tablet (5 mg total) by mouth daily.   Apple Cider Vinegar 188 MG CAPS Take 1 capsule by mouth daily.   aspirin EC 81 MG tablet Take 81 mg by mouth daily.   cholecalciferol (VITAMIN D3) 25 MCG (1000 UNIT) tablet Take 1,000 Units by mouth daily.   fluconazole (DIFLUCAN) 150 MG tablet Take 1 tablet by mouth once and may repeat in 3 days if symptoms persist   glucose blood (ONETOUCH ULTRA) test strip Use as directed 5 times with sliding scale DX E11.65   hyoscyamine (LEVSIN) 0.125 MG tablet TAKE 1 TABLET BY MOUTH EVERY 6 HOURS AS NEEDED FOR CRAMPING.   insulin lispro (HUMALOG) 100 UNIT/ML KwikPen Inject into the skin. Use  at lunch with sliding scale may use up to 10 units [place]   Insulin Lispro Prot & Lispro (HUMALOG 75/25 MIX) (75-25) 100 UNIT/ML Kwikpen Inject 20 Units into the skin 2 (two) times daily with a meal.   levETIRAcetam (KEPPRA) 500 MG tablet Take 1 tablet (500 mg total) by mouth 2 (two) times daily. (Patient taking differently: Take 500 mg by mouth daily. Taking 1/2 tab QD)   losartan (COZAAR) 50 MG tablet Take 1 tablet (50 mg total) by mouth daily.   nitrofurantoin, macrocrystal-monohydrate, (MACROBID) 100 MG capsule Take 1 cap twice per day for 10 days.   rOPINIRole (REQUIP) 0.25 MG tablet Take 1 to 2 tablets po QHS prn   vitamin B-12 (CYANOCOBALAMIN) 100 MCG tablet Take 100 mcg by mouth daily.   [DISCONTINUED] Insulin Pen Needle (BD PEN NEEDLE NANO 2ND GEN) 32G X 4 MM MISC USE AS DIRECTED   [DISCONTINUED] levothyroxine (SYNTHROID) 50 MCG tablet Take 1 tablet (50 mcg total) by mouth daily before breakfast.   [DISCONTINUED] pneumococcal 20-valent conjugate vaccine (PREVNAR 20) 0.5 ML injection Inject 0.5 mLs into the muscle tomorrow at 10 am.   Insulin Pen Needle (BD PEN NEEDLE NANO 2ND GEN) 32G X 4 MM MISC USE AS DIRECTED   levothyroxine (SYNTHROID) 50 MCG tablet Take 1 tablet (50 mcg total) by mouth daily before breakfast.   [EXPIRED] pneumococcal 20-valent conjugate vaccine (PREVNAR 20) 0.5 ML injection Inject 0.5 mLs into  the muscle tomorrow at 10 am for 1 dose.   No facility-administered encounter medications on file as of 12/06/2021.    Surgical History: Past Surgical History:  Procedure Laterality Date   CESAREAN SECTION     COLONOSCOPY WITH PROPOFOL N/A 03/29/2016   Procedure: COLONOSCOPY WITH PROPOFOL;  Surgeon: Josefine Class, MD;  Location: [facility] ENDOSCOPY;  Service: Endoscopy;  Laterality: N/A;   ESOPHAGOGASTRODUODENOSCOPY (EGD) WITH PROPOFOL N/A 09/20/2021   Procedure: ESOPHAGOGASTRODUODENOSCOPY (EGD) WITH PROPOFOL;  Surgeon: Jonathon Bellows, MD;  Location: [facility] ENDOSCOPY;  Service:  Gastroenterology;  Laterality: N/A;   TUBAL LIGATION      Medical History: Past Medical History:  Diagnosis Date   Bipolar disorder ([place])    Depression    Diabetes mellitus without complication ([place])    History of seizure 2020   history of 1 seizure   Hyperlipidemia    Hypertension     Family History: Family History  Problem Relation Age of Onset   Diabetes Mother    Hypertension Mother    Breast cancer Sister     Social History   Socioeconomic History   Marital status: Married    Spouse name: Not on file   Number of children: Not on file   Years of education: Not on file   Highest education level: Not on file  Occupational History   Not on file  Tobacco Use   Smoking status: Every Day    Packs/day: 0.50    Years: 25.00    Pack years: 12.50    Types: Cigarettes   Smokeless tobacco: Never   Tobacco comments:    6 -7  cigarettes   Vaping Use   Vaping Use: Never used  Substance and Sexual Activity   Alcohol use: Yes    Alcohol/week: 2.0 standard drinks    Types: 2 Cans of beer per week    Comment: daily    Drug use: No   Sexual activity: Not on file  Other Topics Concern   Not on file  Social History Narrative   Not on file   Social Determinants of Health   Financial Resource Strain: Not on file  Food Insecurity: Not on file  Transportation Needs: Not on file  Physical Activity: Not on file  Stress: Not on file  Social Connections: Not on file  Intimate Partner Violence: Not on file      Review of Systems  Constitutional:  Negative for chills, fatigue and unexpected weight change.  HENT:  Negative for congestion, postnasal drip, rhinorrhea, sneezing and sore throat.   Eyes:  Negative for redness.  Respiratory:  Negative for cough, chest tightness and shortness of breath.   Cardiovascular:  Negative for chest pain and palpitations.  Gastrointestinal:  Negative for abdominal pain, constipation, diarrhea, nausea and vomiting.  Genitourinary:   Negative for dysuria and frequency.  Musculoskeletal:  Negative for arthralgias, back pain, joint swelling and neck pain.  Skin:  Negative for rash.  Neurological: Negative.  Negative for tremors and numbness.  Hematological:  Negative for adenopathy. Does not bruise/bleed easily.  Psychiatric/Behavioral:  Negative for behavioral problems (Depression), sleep disturbance and suicidal ideas. The patient is not nervous/anxious.    Vital Signs: BP 110/87    Pulse 91    Temp 97.8 F (36.6 C)    Resp 16    Ht 5\' 5"  (1.651 m)    Wt 180 lb (81.6 kg)    LMP 11/07/2015 (Approximate)    SpO2 98%    BMI 29.95  kg/m    Physical Exam Vitals and nursing note reviewed.  Constitutional:      General: She is not in acute distress.    Appearance: She is well-developed. She is obese. She is not diaphoretic.  HENT:     Head: Normocephalic and atraumatic.     Mouth/Throat:     Pharynx: No oropharyngeal exudate.  Eyes:     Pupils: Pupils are equal, round, and reactive to light.  Neck:     Thyroid: No thyromegaly.     Vascular: No JVD.     Trachea: No tracheal deviation.  Cardiovascular:     Rate and Rhythm: Normal rate and regular rhythm.     Heart sounds: Normal heart sounds. No murmur heard.   No friction rub. No gallop.  Pulmonary:     Effort: Pulmonary effort is normal. No respiratory distress.     Breath sounds: No wheezing or rales.  Chest:     Chest wall: No tenderness.  Abdominal:     General: Bowel sounds are normal.     Palpations: Abdomen is soft.  Musculoskeletal:        General: Normal range of motion.     Cervical back: Normal range of motion and neck supple.  Lymphadenopathy:     Cervical: No cervical adenopathy.  Skin:    General: Skin is warm and dry.  Neurological:     Mental Status: She is alert and oriented to person, place, and time.     Cranial Nerves: No cranial nerve deficit.  Psychiatric:        Behavior: Behavior normal.        Thought Content: Thought content  normal.        Judgment: Judgment normal.       Assessment/Plan: 1. Uncontrolled type 2 diabetes mellitus with hyperglycemia (HCC) - POCT HgB A1C is improved to 6.6 from 8.5 last visit. Will plan to increase mixed insulin to 22units BID and continue with meal time sliding scale at lunch only. She will monitor BG and call for refills as needed. Continue to work on diet and exercise  2. Essential hypertension Stable, continue current medications  3. Acquired hypothyroidism Continue synthroid - levothyroxine (SYNTHROID) 50 MCG tablet; Take 1 tablet (50 mcg total) by mouth daily before breakfast.  Dispense: 90 tablet; Refill: 1  4. Alcoholic cirrhosis of liver without ascites ([place]) Followed by GI   General Counseling: Loral verbalizes understanding of the findings of todays visit and agrees with plan of treatment. I have discussed any further diagnostic evaluation that may be needed or ordered today. We also reviewed her medications today. she has been encouraged to call the office with any questions or concerns that should arise related to todays visit.    Orders Placed This Encounter  Procedures   POCT HgB A1C    Meds ordered this encounter  Medications   pneumococcal 20-valent conjugate vaccine (PREVNAR 20) 0.5 ML injection    Sig: Inject 0.5 mLs into the muscle tomorrow at 10 am for 1 dose.    Dispense:  0.5 mL    Refill:  0   Insulin Pen Needle (BD PEN NEEDLE NANO 2ND GEN) 32G X 4 MM MISC    Sig: USE AS DIRECTED    Dispense:  100 each    Refill:  3   levothyroxine (SYNTHROID) 50 MCG tablet    Sig: Take 1 tablet (50 mcg total) by mouth daily before breakfast.    Dispense:  90 tablet  Refill:  1    This patient was seen by Drema Dallas, PA-C in collaboration with Dr. Clay[place] Bigness as a part of collaborative care agreement.   Total time spent:35 Minutes Time spent includes review of chart, medications, test results, and follow up plan with the patient.      Dr  Lavera Guise Internal medicine

## 2022-01-31 ENCOUNTER — Other Ambulatory Visit: Payer: Self-pay

## 2022-01-31 ENCOUNTER — Ambulatory Visit (INDEPENDENT_AMBULATORY_CARE_PROVIDER_SITE_OTHER): Payer: BC Managed Care – PPO | Admitting: Gastroenterology

## 2022-01-31 ENCOUNTER — Encounter: Payer: Self-pay | Admitting: Gastroenterology

## 2022-01-31 VITALS — BP 144/77 | HR 99 | Temp 100.2°F | Wt 180.4 lb

## 2022-01-31 DIAGNOSIS — Z23 Encounter for immunization: Secondary | ICD-10-CM | POA: Diagnosis not present

## 2022-01-31 DIAGNOSIS — F1010 Alcohol abuse, uncomplicated: Secondary | ICD-10-CM

## 2022-01-31 DIAGNOSIS — R768 Other specified abnormal immunological findings in serum: Secondary | ICD-10-CM

## 2022-01-31 DIAGNOSIS — R7989 Other specified abnormal findings of blood chemistry: Secondary | ICD-10-CM

## 2022-01-31 DIAGNOSIS — K7030 Alcoholic cirrhosis of liver without ascites: Secondary | ICD-10-CM | POA: Diagnosis not present

## 2022-01-31 NOTE — Addendum Note (Signed)
Addended by: Wayna Chalet on: 01/31/2022 01:56 PM   Modules accepted: Orders

## 2022-01-31 NOTE — Patient Instructions (Addendum)
Food Choices for Gastroesophageal Reflux Disease, Adult When you have gastroesophageal reflux disease (GERD), the foods you eat and your eating habits are very important. Choosing the right foods can help ease your discomfort. Think about working with a food expert (dietitian) to help you make good choices. What are tips for following this plan? Reading food labels Look for foods that are low in saturated fat. Foods that may help with your symptoms include: Foods that have less than 5% of daily value (DV) of fat. Foods that have 0 grams of trans fat. Cooking Do not fry your food. Cook your food by baking, steaming, grilling, or broiling. These are all methods that do not need a lot of fat for cooking. To add flavor, try to use herbs that are low in spice and acidity. Meal planning  Choose healthy foods that are low in fat, such as: Fruits and vegetables. Whole grains. Low-fat dairy products. Lean meats, fish, and poultry. Eat small meals often instead of eating 3 large meals each day. Eat your meals slowly in a place where you are relaxed. Avoid bending over or lying down until 2-3 hours after eating. Limit high-fat foods such as fatty meats or fried foods. Limit your intake of fatty foods, such as oils, butter, and shortening. Avoid the following as told by your doctor: Foods that cause symptoms. These may be different for different people. Keep a food diary to keep track of foods that cause symptoms. Alcohol. Drinking a lot of liquid with meals. Eating meals during the 2-3 hours before bed. Lifestyle Stay at a healthy weight. Ask your doctor what weight is healthy for you. If you need to lose weight, work with your doctor to do so safely. Exercise for at least 30 minutes on 5 or more days each week, or as told by your doctor. Wear loose-fitting clothes. Do not smoke or use any products that contain nicotine or tobacco. If you need help quitting, ask your doctor. Sleep with the head  of your bed higher than your feet. Use a wedge under the mattress or blocks under the bed frame to raise the head of the bed. Chew sugar-free gum after meals. What foods should eat? Eat a healthy, well-balanced diet of fruits, vegetables, whole grains, low-fat dairy products, lean meats, fish, and poultry. Each person is different. Foods that may cause symptoms in one person may not cause any symptoms in another person. Work with your doctor to find foods that are safe for you. The items listed above may not be a complete list of what you can eat and drink. Contact a food expert for more options. What foods should I avoid? Limiting some of these foods may help in managing the symptoms of GERD. Everyone is different. Talk with a food expert or your doctor to help you find the exact foods to avoid, if any. Fruits Any fruits prepared with added fat. Any fruits that cause symptoms. For some people, this may include citrus fruits, such as oranges, grapefruit, pineapple, and lemons. Vegetables Deep-fried vegetables. French fries. Any vegetables prepared with added fat. Any vegetables that cause symptoms. For some people, this may include tomatoes and tomato products, chili peppers, onions and garlic, and horseradish. Grains Pastries or quick breads with added fat. Meats and other proteins High-fat meats, such as fatty beef or pork, hot dogs, ribs, ham, sausage, salami, and bacon. Fried meat or protein, including fried fish and fried chicken. Nuts and nut butters, in large amounts. Dairy Whole milk   and chocolate milk. Sour cream. Cream. Ice cream. Cream cheese. Milkshakes. Fats and oils Butter. Margarine. Shortening. Ghee. Beverages Coffee and tea, with or without caffeine. Carbonated beverages. Sodas. Energy drinks. Fruit juice made with acidic fruits, such as orange or grapefruit. Tomato juice. Alcoholic drinks. Sweets and desserts Chocolate and cocoa. Donuts. Seasonings and condiments Pepper.  Peppermint and spearmint. Added salt. Any condiments, herbs, or seasonings that cause symptoms. For some people, this may include curry, hot sauce, or vinegar-based salad dressings. The items listed above may not be a complete list of what you should not eat and drink. Contact a food expert for more options. Questions to ask your doctor Diet and lifestyle changes are often the first steps that are taken to manage symptoms of GERD. If diet and lifestyle changes do not help, talk with your doctor about taking medicines. Where to find more information International [employer] for Gastrointestinal Disorders: aboutgerd.org Summary When you have GERD, food and lifestyle choices are very important in easing your symptoms. Eat small meals often instead of 3 large meals a day. Eat your meals slowly and in a place where you are relaxed. Avoid bending over or lying down until 2-3 hours after eating. Limit high-fat foods such as fatty meats or fried foods. This information is not intended to replace advice given to you by your health care provider. Make sure you discuss any questions you have with your health care provider. Document Revised: 06/19/2020 Document Reviewed: 06/19/2020 Elsevier Patient Education  [place]. High-Fiber Eating Plan Fiber, also called dietary fiber, is a type of carbohydrate. It is found foods such as fruits, vegetables, whole grains, and beans. A high-fiber diet can have many health benefits. Your health care provider may recommend a high-fiber diet to help: Prevent constipation. Fiber can make your bowel movements more regular. Lower your cholesterol. Relieve the following conditions: Inflammation of veins in the anus (hemorrhoids). Inflammation of specific areas of the digestive tract (uncomplicated diverticulosis). A problem of the large intestine, also called the colon, that sometimes causes pain and diarrhea (irritable bowel syndrome, or IBS). Prevent overeating as  part of a weight-loss plan. Prevent heart disease, type 2 diabetes, and certain cancers. What are tips for following this plan? Reading food labels  Check the nutrition facts label on food products for the amount of dietary fiber. Choose foods that have 5 grams of fiber or more per serving. The goals for recommended daily fiber intake include: Men (age 9 or younger): 34-38 g. Men (over age 47): 28-34 g. Women (age 56 or younger): 25-28 g. Women (over age 18): 22-25 g. Your daily fiber goal is _____________ g. Shopping Choose whole fruits and vegetables instead of processed forms, such as apple juice or applesauce. Choose a wide variety of high-fiber foods such as avocados, lentils, oats, and kidney beans. Read the nutrition facts label of the foods you choose. Be aware of foods with added fiber. These foods often have high sugar and sodium amounts per serving. Cooking Use whole-grain flour for baking and cooking. Cook with brown rice instead of white rice. Meal planning Start the day with a breakfast that is high in fiber, such as a cereal that contains 5 g of fiber or more per serving. Eat breads and cereals that are made with whole-grain flour instead of refined flour or white flour. Eat brown rice, bulgur wheat, or millet instead of white rice. Use beans in place of meat in soups, salads, and pasta dishes. Be sure that half  of the grains you eat each day are whole grains. General information You can get the recommended daily intake of dietary fiber by: Eating a variety of fruits, vegetables, grains, nuts, and beans. Taking a fiber supplement if you are not able to take in enough fiber in your diet. It is better to get fiber through food than from a supplement. Gradually increase how much fiber you consume. If you increase your intake of dietary fiber too quickly, you may have bloating, cramping, or gas. Drink plenty of water to help you digest fiber. Choose high-fiber snacks, such  as berries, raw vegetables, nuts, and popcorn. What foods should I eat? Fruits Berries. Pears. Apples. Oranges. Avocado. Prunes and raisins. Dried figs. Vegetables Sweet potatoes. Spinach. Kale. Artichokes. Cabbage. Broccoli. Cauliflower. Green peas. Carrots. Squash. Grains Whole-grain breads. Multigrain cereal. Oats and oatmeal. Brown rice. Barley. Bulgur wheat. [place]. Quinoa. Bran muffins. Popcorn. Rye wafer crackers. Meats and other proteins Navy beans, kidney beans, and pinto beans. Soybeans. Split peas. Lentils. Nuts and seeds. Dairy Fiber-fortified yogurt. Beverages Fiber-fortified soy milk. Fiber-fortified orange juice. Other foods Fiber bars. The items listed above may not be a complete list of recommended foods and beverages. Contact a dietitian for more information. What foods should I avoid? Fruits Fruit juice. Cooked, strained fruit. Vegetables Fried potatoes. Canned vegetables. Well-cooked vegetables. Grains White bread. Pasta made with refined flour. White rice. Meats and other proteins Fatty cuts of meat. Fried chicken or fried fish. Dairy Milk. Yogurt. Cream cheese. Sour cream. Fats and oils Butters. Beverages Soft drinks. Other foods Cakes and pastries. The items listed above may not be a complete list of foods and beverages to avoid. Talk with your dietitian about what choices are best for you. Summary Fiber is a type of carbohydrate. It is found in foods such as fruits, vegetables, whole grains, and beans. A high-fiber diet has many benefits. It can help to prevent constipation, lower blood cholesterol, aid weight loss, and reduce your risk of heart disease, diabetes, and certain cancers. Increase your intake of fiber gradually. Increasing fiber too quickly may cause cramping, bloating, and gas. Drink plenty of water while you increase the amount of fiber you consume. The best sources of fiber include whole fruits and vegetables, whole grains, nuts, seeds,  and beans. This information is not intended to replace advice given to you by your health care provider. Make sure you discuss any questions you have with your health care provider. Document Revised: 04/13/2020 Document Reviewed: 04/13/2020 Elsevier Patient Education  2022 [employer].

## 2022-01-31 NOTE — Progress Notes (Signed)
Jonathon Bellows MD, MRCP(U.K) [address]  [place]  [place], [place] [address]  Main: [phone]  Fax: [phone]   Primary Care Physician: Lavera Guise, MD  Primary Gastroenterologist:  Dr. Jonathon Bellows   Chief Complaint  Patient presents with   Alcoholic cirrhosis    HPI: Abigail Rice is a 56 y.o. female    Summary of history :  Initially referred and seen on 07/25/2021 for elevated LFTs.  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 History of heavy alcohol consumption 3-4 beers per day.  Differentials being considered are ITP and chronic alcohol consumption/liver disease.  Undergone a bone marrow biopsy in MarchDenies any illegal drug use    06/06/2021: Hemoglobin 11.2 g with a platelet count of 40 MCV of 99.7, albumin 3.3 alkaline phosphatase of 176, AST of 67, ALT of 60 hemochromatosis genetic testing showed no mutation.  HIV testing was negative acute hepatitis panel showed no active hepatitis B surface antigen or hepatitis C virus antibody.  B12 levels was normal in January 2022 iron studies showed increased iron percentage saturation of 76% and a ferritin of 595 08/10/2021: Ultrasound right upper quadrant showed lobular hepatic contour with nodularity suspicious for early cirrhosis.    07/25/2021: Elevated CK2 96, hepatitis B E antigen positive not immune to hepatitis A ceruloplasmin low otherwise autoimmune screen, celiac serology was negative.  Elevated IgE   08/15/2021 TSH normal.  CMP shows elevated alkaline phosphatase 307 along with transaminases hemoglobin 11.3 g hepatitis delta PCR not detected, hepatitis B DNA not detected CK elevated at 24-hour urinary copper excretion within normal limits 09/20/2021: EGD: Schatzki's ring found in the lower third of the esophagus nonobstructing.  No esophageal varices seen    Interval history  10/31/2021-01/31/2022   10/31/2021: Ceruloplasmin low 16.7 , IGE elevated 1766, Hep B e antigen negative Did not obtain lab  tests as requested previously Still drinking since last visit .   Current Outpatient Medications  Medication Sig Dispense Refill   amLODipine (NORVASC) 5 MG tablet Take 1 tablet (5 mg total) by mouth daily. 90 tablet 1   Apple Cider Vinegar 188 MG CAPS Take 1 capsule by mouth daily.     aspirin EC 81 MG tablet Take 81 mg by mouth daily.     cholecalciferol (VITAMIN D3) 25 MCG (1000 UNIT) tablet Take 1,000 Units by mouth daily.     fluconazole (DIFLUCAN) 150 MG tablet Take 1 tablet by mouth once and may repeat in 3 days if symptoms persist 3 tablet 0   glucose blood (ONETOUCH ULTRA) test strip Use as directed 5 times with sliding scale DX E11.65 300 strip 1   hyoscyamine (LEVSIN) 0.125 MG tablet TAKE 1 TABLET BY MOUTH EVERY 6 HOURS AS NEEDED FOR CRAMPING. 360 tablet 1   insulin lispro (HUMALOG) 100 UNIT/ML KwikPen Inject into the skin. Use at lunch with sliding scale may use up to 10 units [place] 15 mL 11   Insulin Lispro Prot & Lispro (HUMALOG 75/25 MIX) (75-25) 100 UNIT/ML Kwikpen Inject 20 Units into the skin 2 (two) times daily with a meal. 15 mL 11   Insulin Pen Needle (BD PEN NEEDLE NANO 2ND GEN) 32G X 4 MM MISC USE AS DIRECTED 100 each 3   levETIRAcetam (KEPPRA) 500 MG tablet Take 1 tablet (500 mg total) by mouth 2 (two) times daily. (Patient taking differently: Take 500 mg by mouth daily. Taking 1/2 tab QD) 180 tablet 1   levothyroxine (SYNTHROID) 50  MCG tablet Take 1 tablet (50 mcg total) by mouth daily before breakfast. 90 tablet 1   losartan (COZAAR) 50 MG tablet Take 1 tablet (50 mg total) by mouth daily. 90 tablet 3   nitrofurantoin, macrocrystal-monohydrate, (MACROBID) 100 MG capsule Take 1 cap twice per day for 10 days. 20 capsule 0   rOPINIRole (REQUIP) 0.25 MG tablet Take 1 to 2 tablets po QHS prn 120 tablet 3   vitamin B-12 (CYANOCOBALAMIN) 100 MCG tablet Take 100 mcg by mouth daily.     No current facility-administered medications for this visit.    Allergies as of 01/31/2022 -  Review Complete 01/31/2022  Allergen Reaction Noted   Ace inhibitors  03/28/2016   Ciprofloxacin  08/16/2019   Lisinopril  01/23/2018   Risperdal [risperidone]  01/17/2021   Vytorin [ezetimibe-simvastatin]  03/28/2016    ROS:  General: Negative for anorexia, weight loss, fever, chills, fatigue, weakness. ENT: Negative for hoarseness, difficulty swallowing , nasal congestion. CV: Negative for chest pain, angina, palpitations, dyspnea on exertion, peripheral edema.  Respiratory: Negative for dyspnea at rest, dyspnea on exertion, cough, sputum, wheezing.  GI: See history of present illness. GU:  Negative for dysuria, hematuria, urinary incontinence, urinary frequency, nocturnal urination.  Endo: Negative for unusual weight change.    Physical Examination:   BP (!) 144/77    Pulse 99    Temp 100.2 F (37.9 C) (Oral)    Wt 180 lb 6.4 oz (81.8 kg)    LMP 11/07/2015 (Approximate)    BMI 30.02 kg/m   General: Well-nourished, well-developed in no acute distress.  Eyes: No icterus. Conjunctivae pink.= Neuro: Alert and oriented x 3.  Grossly intact. Skin: Warm and dry, no jaundice.   Psych: Alert and cooperative, normal mood and affect.   Imaging Studies: No results found.  Assessment and Plan:   Abigail Rice is a 56 y.o. y/o female here to follow-up for abnormal LFTs history of excess alcohol consumption.  Follows with Dr. Tasia Catchings in hematology.  Underwent a bone marrow biopsy.  Recent ultrasound shows features of possible cirrhosis.  She has had multiple labs which are abnormal but which are not diagnostic of any condition.  Needs further evaluation.  It is very possible that the elevated immunoglobulins, ferritin, CK may be due to the effects of alcohol. False positive Hep B eantigen.  Continues to drink alcohol advised to strongly stop     Plan 1.  Serum ceruloplasmin was low but 24-hour urinary copper was in the normal range.  Follow-up on ophthalmology evaluation for [facility]  rings which she has scheduled in April 2023.   Based on the results may need to obtain genetic testing for her liver biopsy down the road.  If does not stop drinking alcohol have advised that we may have no choice but to obtain liver biopsy.  Noted elevated immunoglobulin levels we will repeat.  CK was elevated previously we will repeat if elevated will need referral to rheumatology.  Check INR to calculate meld score   2.  Right upper quadrant ultrasound in March 2022 to screen for [facility]   3.  Strongly suggest her to avoid all alcohol consumption   4.  EGD to screen for esophageal varices in 09/11/2024   5. Needs hep A vaccine we will check hepatitis B surface antibody for combined vaccination   6.  Elevated ferritin and transferrin saturation we will check HFE gene mutation to rule out hemochromatosis     Dr Jonathon Bellows  MD,MRCP (U.K) Follow up in May 2023

## 2022-02-06 ENCOUNTER — Other Ambulatory Visit: Payer: Self-pay | Admitting: Physician Assistant

## 2022-02-06 LAB — COMPREHENSIVE METABOLIC PANEL
ALT: 47 IU/L — ABNORMAL HIGH (ref 0–32)
AST: 57 IU/L — ABNORMAL HIGH (ref 0–40)
Albumin/Globulin Ratio: 0.9 — ABNORMAL LOW (ref 1.2–2.2)
Albumin: 3.6 g/dL — ABNORMAL LOW (ref 3.8–4.9)
Alkaline Phosphatase: 227 IU/L — ABNORMAL HIGH (ref 44–121)
BUN/Creatinine Ratio: 18 (ref 9–23)
BUN: 19 mg/dL (ref 6–24)
Bilirubin Total: 0.6 mg/dL (ref 0.0–1.2)
CO2: 20 mmol/L (ref 20–29)
Calcium: 8.7 mg/dL (ref 8.7–10.2)
Chloride: 102 mmol/L (ref 96–106)
Creatinine, Ser: 1.08 mg/dL — ABNORMAL HIGH (ref 0.57–1.00)
Globulin, Total: 4.1 g/dL (ref 1.5–4.5)
Glucose: 184 mg/dL — ABNORMAL HIGH (ref 70–99)
Potassium: 3.9 mmol/L (ref 3.5–5.2)
Sodium: 137 mmol/L (ref 134–144)
Total Protein: 7.7 g/dL (ref 6.0–8.5)
eGFR: 60 mL/min/{1.73_m2} (ref >59)

## 2022-02-06 LAB — HEMOCHROMATOSIS DNA-PCR(C282Y,H63D)

## 2022-02-06 LAB — IMMUNOGLOBULINS A/E/G/M, SERUM
IgA/Immunoglobulin A, Serum: 440 mg/dL — ABNORMAL HIGH (ref 87–352)
IgE (Immunoglobulin E), Serum: 1655 IU/mL — ABNORMAL HIGH (ref 6–495)
IgG (Immunoglobin G), Serum: 2534 mg/dL — ABNORMAL HIGH (ref 586–1602)
IgM (Immunoglobulin M), Srm: 47 mg/dL (ref 26–217)

## 2022-02-06 LAB — HEPATITIS B SURFACE ANTIBODY,QUALITATIVE: Hep B Surface Ab, Qual: NONREACTIVE

## 2022-02-06 LAB — GAMMA GT: GGT: 189 IU/L — ABNORMAL HIGH (ref 0–60)

## 2022-02-06 LAB — CK: Total CK: 206 U/L — ABNORMAL HIGH (ref 32–182)

## 2022-02-06 LAB — PROTIME-INR
INR: 1.3 — ABNORMAL HIGH (ref 0.9–1.2)
Prothrombin Time: 13.3 s — ABNORMAL HIGH (ref 9.1–12.0)

## 2022-02-07 ENCOUNTER — Other Ambulatory Visit: Payer: Self-pay

## 2022-02-07 MED ORDER — BD PEN NEEDLE NANO 2ND GEN 32G X 4 MM MISC: 3 refills | Status: DC

## 2022-03-04 ENCOUNTER — Ambulatory Visit: Payer: Managed Care, Other (non HMO)

## 2022-03-06 ENCOUNTER — Ambulatory Visit
Admission: RE | Admit: 2022-03-06 | Discharge: 2022-03-06 | Disposition: A | Discharge status: Home / Self Care | Payer: BC Managed Care – PPO | Source: Ambulatory Visit | Attending: Gastroenterology | Admitting: Gastroenterology

## 2022-03-06 ENCOUNTER — Other Ambulatory Visit: Payer: Self-pay

## 2022-03-06 DIAGNOSIS — K7030 Alcoholic cirrhosis of liver without ascites: Secondary | ICD-10-CM | POA: Insufficient documentation

## 2022-03-06 DIAGNOSIS — K7460 Unspecified cirrhosis of liver: Secondary | ICD-10-CM | POA: Diagnosis not present

## 2022-03-07 ENCOUNTER — Other Ambulatory Visit: Payer: Self-pay | Admitting: Physician Assistant

## 2022-03-07 ENCOUNTER — Ambulatory Visit: Payer: BC Managed Care – PPO | Admitting: Physician Assistant

## 2022-03-07 NOTE — Telephone Encounter (Signed)
Pt need appt for refills  ?

## 2022-03-11 ENCOUNTER — Encounter: Payer: Self-pay | Admitting: Gastroenterology

## 2022-03-14 ENCOUNTER — Inpatient Hospital Stay: Payer: BC Managed Care – PPO

## 2022-03-14 ENCOUNTER — Inpatient Hospital Stay: Payer: BC Managed Care – PPO | Attending: Oncology | Admitting: Nurse Practitioner

## 2022-03-14 ENCOUNTER — Ambulatory Visit: Payer: Managed Care, Other (non HMO) | Admitting: Oncology

## 2022-03-14 ENCOUNTER — Other Ambulatory Visit: Payer: Self-pay

## 2022-03-14 ENCOUNTER — Encounter: Payer: Self-pay | Admitting: Nurse Practitioner

## 2022-03-14 ENCOUNTER — Other Ambulatory Visit: Payer: Managed Care, Other (non HMO)

## 2022-03-14 VITALS — BP 120/76 | HR 79 | Temp 96.7°F | Resp 17 | Wt 187.0 lb

## 2022-03-14 DIAGNOSIS — K7460 Unspecified cirrhosis of liver: Secondary | ICD-10-CM | POA: Insufficient documentation

## 2022-03-14 DIAGNOSIS — Z794 Long term (current) use of insulin: Secondary | ICD-10-CM | POA: Diagnosis not present

## 2022-03-14 DIAGNOSIS — E1365 Other specified diabetes mellitus with hyperglycemia: Secondary | ICD-10-CM

## 2022-03-14 DIAGNOSIS — I129 Hypertensive chronic kidney disease with stage 1 through stage 4 chronic kidney disease, or unspecified chronic kidney disease: Secondary | ICD-10-CM | POA: Insufficient documentation

## 2022-03-14 DIAGNOSIS — Z803 Family history of malignant neoplasm of breast: Secondary | ICD-10-CM | POA: Insufficient documentation

## 2022-03-14 DIAGNOSIS — D696 Thrombocytopenia, unspecified: Secondary | ICD-10-CM | POA: Insufficient documentation

## 2022-03-14 DIAGNOSIS — F17210 Nicotine dependence, cigarettes, uncomplicated: Secondary | ICD-10-CM | POA: Insufficient documentation

## 2022-03-14 DIAGNOSIS — K760 Fatty (change of) liver, not elsewhere classified: Secondary | ICD-10-CM | POA: Diagnosis not present

## 2022-03-14 DIAGNOSIS — E871 Hypo-osmolality and hyponatremia: Secondary | ICD-10-CM | POA: Diagnosis not present

## 2022-03-14 DIAGNOSIS — D649 Anemia, unspecified: Secondary | ICD-10-CM | POA: Insufficient documentation

## 2022-03-14 DIAGNOSIS — R7401 Elevation of levels of liver transaminase levels: Secondary | ICD-10-CM | POA: Insufficient documentation

## 2022-03-14 DIAGNOSIS — K700 Alcoholic fatty liver: Secondary | ICD-10-CM | POA: Insufficient documentation

## 2022-03-14 DIAGNOSIS — N189 Chronic kidney disease, unspecified: Secondary | ICD-10-CM | POA: Insufficient documentation

## 2022-03-14 DIAGNOSIS — E1122 Type 2 diabetes mellitus with diabetic chronic kidney disease: Secondary | ICD-10-CM | POA: Diagnosis not present

## 2022-03-14 LAB — CBC WITH DIFFERENTIAL/PLATELET
Abs Immature Granulocytes: 0.02 10*3/uL (ref 0.00–0.07)
Basophils Absolute: 0 10*3/uL (ref 0.0–0.1)
Basophils Relative: 0 %
Eosinophils Absolute: 0 10*3/uL (ref 0.0–0.5)
Eosinophils Relative: 1 %
HCT: 31.5 % — ABNORMAL LOW (ref 36.0–46.0)
Hemoglobin: 10.6 g/dL — ABNORMAL LOW (ref 12.0–15.0)
Immature Granulocytes: 0 %
Lymphocytes Relative: 43 %
Lymphs Abs: 2 10*3/uL (ref 0.7–4.0)
MCH: 32.7 pg (ref 26.0–34.0)
MCHC: 33.7 g/dL (ref 30.0–36.0)
MCV: 97.2 fL (ref 80.0–100.0)
Monocytes Absolute: 0.5 10*3/uL (ref 0.1–1.0)
Monocytes Relative: 11 %
Neutro Abs: 2.1 10*3/uL (ref 1.7–7.7)
Neutrophils Relative %: 45 %
Platelets: 36 10*3/uL — ABNORMAL LOW (ref 150–400)
RBC: 3.24 MIL/uL — ABNORMAL LOW (ref 3.87–5.11)
RDW: 14.7 % (ref 11.5–15.5)
WBC: 4.8 10*3/uL (ref 4.0–10.5)
nRBC: 0 % (ref 0.0–0.2)

## 2022-03-14 LAB — COMPREHENSIVE METABOLIC PANEL
ALT: 61 U/L — ABNORMAL HIGH (ref 0–44)
AST: 70 U/L — ABNORMAL HIGH (ref 15–41)
Albumin: 2.6 g/dL — ABNORMAL LOW (ref 3.5–5.0)
Alkaline Phosphatase: 242 U/L — ABNORMAL HIGH (ref 38–126)
Anion gap: 5 (ref 5–15)
BUN: 16 mg/dL (ref 6–20)
CO2: 23 mmol/L (ref 22–32)
Calcium: 8.1 mg/dL — ABNORMAL LOW (ref 8.9–10.3)
Chloride: 104 mmol/L (ref 98–111)
Creatinine, Ser: 1.25 mg/dL — ABNORMAL HIGH (ref 0.44–1.00)
GFR, Estimated: 51 mL/min — ABNORMAL LOW (ref >60)
Glucose, Bld: 205 mg/dL — ABNORMAL HIGH (ref 70–99)
Potassium: 3.9 mmol/L (ref 3.5–5.1)
Sodium: 132 mmol/L — ABNORMAL LOW (ref 135–145)
Total Bilirubin: 0.7 mg/dL (ref 0.3–1.2)
Total Protein: 7.3 g/dL (ref 6.5–8.1)

## 2022-03-14 NOTE — Progress Notes (Signed)
?Hematology/Oncology Follow Up Note ?[place] ?[phone] [phone] ? ? ?Patient Care Team: ?Lavera Guise, MD as PCP - General (Internal Medicine) ? ?REFERRING PROVIDER: ?Lavera Guise, MD ? ?CHIEF COMPLAINTS/REASON FOR VISIT:  ? thrombocytopenia ? ?HISTORY OF PRESENTING ILLNESS:  ?Abigail Rice is a 56 y.o. female who was seen in consultation at the request of Lavera Guise, MD for evaluation of thrombocytopenia ? ?Reviewed patient's labs done previously.  ?Labs showed decreased platelet counts at 43,000, ?wbc 4.1 hemoglobin 10.7 ?Reviewed patient's previous labs. Thrombocytopenia is chronic chronic onset , since at least 2019.  Platelet count in 2019 was 1 43,000.  Starting August 2020, platelet count has been in the range of 50-60,000 ?No aggravating or elevated factors. ? ?Associated symptoms or signs:  ?Denies weight loss, fever, chills, fatigue, night sweats.  ?Denies hematochezia, hematuria, hematemesis, epistaxis, black tarry stool.  easy bruising.   ?Context:  ?History of hepatitis or HIV infection, denies ? ?Patient has a history of heavy alcohol use.  She reports that she has cut down her alcohol intake since last year and currently she drinks 3-4 beer per day.  With a history of seizure and currently on Keppra. ? ?#Thrombocytopenia increased to 80,000 after a course of dexamethasone 40 mg daily for 4 days. ?Platelet count further decreased to 38,000 a few weeks later. ? 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  Findings are favored to be reactive in nature.FISH results is normal. ? ?INTERVAL HISTORY ?Abigail Rice is a 56 y.o. female who has above history reviewed by me today presents for follow up visit for management of thrombocytopenia. Continues to drink alcohol. Questions how people who don't drink also get cirrhosis and therefore, doesn't believe alcohol plays a role.  Continues to struggle with elevated blood sugars. No bleeding events. Weight up.  ? ?Review of Systems  ?Constitutional:  Negative for appetite change, fatigue and unexpected weight change.  ?HENT:   Negative for mouth sores, sore throat and trouble swallowing.   ?Respiratory:  Negative for chest tightness and shortness of breath.   ?Cardiovascular:  Negative for leg swelling.  ?Gastrointestinal:  Negative for abdominal pain, constipation, diarrhea, nausea and vomiting.  ?Genitourinary:  Negative for bladder incontinence and dysuria.   ?Musculoskeletal:  Negative for flank pain and neck stiffness.  ?Skin:  Negative for itching, rash and wound.  ?Neurological:  Negative for dizziness, headaches, light-headedness and numbness.  ?Hematological:  Negative for adenopathy. Bruises/bleeds easily.  ?Psychiatric/Behavioral:  Negative for confusion, depression and sleep disturbance. The patient is not nervous/anxious.   ? ?MEDICAL HISTORY:  ?Past Medical History:  ?Diagnosis Date  ? Bipolar disorder ([place])   ? Depression   ? Diabetes mellitus without complication ([place])   ? History of seizure 2020  ? history of 1 seizure  ? Hyperlipidemia   ? Hypertension   ? ? ?SURGICAL HISTORY: ?Past Surgical History:  ?Procedure Laterality Date  ? CESAREAN SECTION    ? COLONOSCOPY WITH PROPOFOL N/A 03/29/2016  ? Procedure: COLONOSCOPY WITH PROPOFOL;  Surgeon: Josefine Class, MD;  Location: [facility] ENDOSCOPY;  Service: Endoscopy;  Laterality: N/A;  ? ESOPHAGOGASTRODUODENOSCOPY (EGD) WITH PROPOFOL N/A 09/20/2021  ? Procedure: ESOPHAGOGASTRODUODENOSCOPY (EGD) WITH PROPOFOL;  Surgeon: Jonathon Bellows, MD;  Location: [facility] ENDOSCOPY;  Service: Gastroenterology;  Laterality: N/A;  ? TUBAL LIGATION    ? ? ?SOCIAL HISTORY: ?Social History  ? ?Socioeconomic History  ? Marital status: Married  ?  Spouse name: Not on file  ? Number of children: Not on file  ? Years of education: Not on file  ? Highest education level: Not on file  ?Occupational History  ?  Not on file  ?Tobacco Use  ? Smoking status: Every Day  ?  Packs/day: 0.50  ?  Years: 25.00  ?  Pack years: 12.50  ?  Types: Cigarettes  ? Smokeless tobacco: Never  ? Tobacco comments:  ?  6 -7  cigarettes   ?Vaping Use  ? Vaping Use: Never used  ?Substance and Sexual Activity  ? Alcohol use: Yes  ?  Alcohol/week: 2.0 standard drinks  ?  Types: 2 Cans of beer per week  ?  Comment: daily   ? Drug use: No  ? Sexual activity: Not on file  ?Other Topics Concern  ? Not on file  ?Social History Narrative  ? Not on file  ? ?Social Determinants of Health  ? ?Financial Resource Strain: Not on file  ?Food Insecurity: Not on file  ?Transportation Needs: Not on file  ?Physical Activity: Not on file  ?Stress: Not on file  ?Social Connections: Not on file  ?Intimate Partner Violence: Not on file  ? ? ?FAMILY HISTORY: ?Family History  ?Problem Relation Age of Onset  ? Diabetes Mother   ? Hypertension Mother   ? Breast cancer Sister   ? ? ?ALLERGIES:  is allergic to ace inhibitors, ciprofloxacin, lisinopril, risperdal [risperidone], and vytorin [ezetimibe-simvastatin]. ? ?MEDICATIONS:  ?Current Outpatient Medications  ?Medication Sig Dispense Refill  ? amLODipine (NORVASC) 5 MG tablet Take 1 tablet (5 mg total) by mouth daily. 90 tablet 1  ? Apple Cider Vinegar 188 MG CAPS Take 1 capsule by mouth daily.    ? cholecalciferol (VITAMIN D3) 25 MCG (1000 UNIT) tablet Take 1,000 Units by mouth daily.    ? glucose blood (ONETOUCH ULTRA) test strip Use as directed 5 times with sliding scale DX E11.65 300 strip 1  ? hyoscyamine (LEVSIN) 0.125 MG tablet TAKE 1 TABLET BY MOUTH EVERY 6 HOURS AS NEEDED FOR CRAMPING. 360 tablet 1  ? insulin lispro (HUMALOG) 100 UNIT/ML KwikPen Inject into the skin. Use at lunch with sliding scale may use up to 10 units [place] 15 mL 11  ? Insulin Lispro Prot & Lispro (HUMALOG 75/25 MIX) (75-25) 100 UNIT/ML Kwikpen Inject 20 Units into the skin 2 (two) times daily with a meal. 15 mL 11  ? Insulin Pen Needle (BD PEN  NEEDLE NANO 2ND GEN) 32G X 4 MM MISC USE AS DIRECTED 4 times a day with insulin pens 400 each 3  ? levETIRAcetam (KEPPRA) 500 MG tablet Take 1 tablet (500 mg total) by mouth 2 (two) times daily. (Patient taking differently: Take 500 mg by mouth daily. Taking 1/2 tab QD) 180 tablet 1  ? levothyroxine (SYNTHROID) 50 MCG tablet Take 1 tablet (50 mcg total) by mouth daily before breakfast. 90 tablet 1  ? losartan (COZAAR) 50 MG tablet Take 1 tablet (50 mg total) by mouth daily. 90 tablet 3  ? nitrofurantoin, macrocrystal-monohydrate, (MACROBID) 100 MG capsule Take 1 cap twice per day for 10 days. 20 capsule 0  ? rOPINIRole (REQUIP) 0.25 MG tablet TAKE 1 TO 2 TABLETS BY MOUTH AT BEDTIME AS NEEDED 180 tablet 0  ? vitamin B-12 (CYANOCOBALAMIN) 100 MCG tablet Take 100 mcg by mouth daily.    ? aspirin EC 81 MG tablet Take 81 mg by mouth daily.    ? fluconazole (DIFLUCAN) 150  MG tablet Take 1 tablet by mouth once and may repeat in 3 days if symptoms persist 3 tablet 0  ? ?No current facility-administered medications for this visit.  ? ? ?PHYSICAL EXAMINATION: ?ECOG PERFORMANCE STATUS: 0 - Asymptomatic ?Vitals:  ? 03/14/22 1055  ?BP: 120/76  ?Pulse: 79  ?Resp: 17  ?Temp: (!) 96.7 ?F (35.9 ?C)  ?SpO2: 100%  ? ?Filed Weights  ? 03/14/22 1055  ?Weight: 187 lb (84.8 kg)  ? ? ?Physical Exam ?Constitutional:   ?   General: She is not in acute distress. ?   Appearance: She is well-developed. She is not ill-appearing.  ?Eyes:  ?   General: No scleral icterus. ?Cardiovascular:  ?   Rate and Rhythm: Normal rate and regular rhythm.  ?Pulmonary:  ?   Effort: Pulmonary effort is normal.  ?Abdominal:  ?   General: There is distension.  ?   Tenderness: There is no abdominal tenderness. There is no rebound.  ?Musculoskeletal:     ?   General: No tenderness or deformity.  ?   Right lower leg: No edema.  ?   Left lower leg: No edema.  ?Skin: ?   General: Skin is warm and dry.  ?   Coloration: Skin is not pale.  ?   Findings: No bruising.   ?Neurological:  ?   General: No focal deficit present.  ?   Mental Status: She is alert and oriented to person, place, and time.  ?   Motor: No weakness.  ?Psychiatric:     ?   Mood and Affect: Mood normal.

## 2022-03-14 NOTE — Progress Notes (Signed)
Patient here for oncology follow-up appointment, expresses concerns of bleeding gums  ?

## 2022-03-22 ENCOUNTER — Ambulatory Visit: Payer: BC Managed Care – PPO | Admitting: Physician Assistant

## 2022-03-22 ENCOUNTER — Encounter: Payer: Self-pay | Admitting: Physician Assistant

## 2022-03-22 VITALS — BP 136/78 | HR 94 | Temp 98.7°F | Resp 16 | Ht 65.0 in | Wt 179.0 lb

## 2022-03-22 DIAGNOSIS — I10 Essential (primary) hypertension: Secondary | ICD-10-CM

## 2022-03-22 DIAGNOSIS — D696 Thrombocytopenia, unspecified: Secondary | ICD-10-CM

## 2022-03-22 DIAGNOSIS — E039 Hypothyroidism, unspecified: Secondary | ICD-10-CM | POA: Diagnosis not present

## 2022-03-22 DIAGNOSIS — E1165 Type 2 diabetes mellitus with hyperglycemia: Secondary | ICD-10-CM | POA: Diagnosis not present

## 2022-03-22 DIAGNOSIS — B351 Tinea unguium: Secondary | ICD-10-CM

## 2022-03-22 DIAGNOSIS — K7030 Alcoholic cirrhosis of liver without ascites: Secondary | ICD-10-CM

## 2022-03-22 LAB — POCT GLYCOSYLATED HEMOGLOBIN (HGB A1C): Hemoglobin A1C: 6.4 % — AB (ref 4.0–5.6)

## 2022-03-22 MED ORDER — LEVOTHYROXINE SODIUM 50 MCG PO TABS: 50.0000 ug | ORAL_TABLET | Freq: Every day | ORAL | 1 refills | Status: DC

## 2022-03-22 MED ORDER — BD PEN NEEDLE NANO 2ND GEN 32G X 4 MM MISC: 3 refills | Status: DC

## 2022-03-22 MED ORDER — ONETOUCH ULTRA VI STRP: ORAL_STRIP | 1 refills | Status: DC

## 2022-03-22 NOTE — Progress Notes (Signed)
[facility] ?[address] ?[place], [place] [address] ? ?Internal MEDICINE  ?Office Visit Note ? ?Patient Name: Abigail Rice ? [id number]  ?[id number] ? ?Date of Service: 04/03/2022 ? ?Chief Complaint  ?Patient presents with  ? Follow-up  ? Depression  ? Diabetes  ? Hyperlipidemia  ? Hypertension  ? ? ?HPI ?Pt is here for routine follow up ?-Her insurance changed and has had difficulty getting test strips and pen needles ?-BG fluctuate ?-she is taking 20 units mixed BID and sliding scale at lunch ?-Big toe has some fungus and nail thickening, but cant see podiatry and has elevated Lfts so not candidate for oral terbinafine currently ?-Bp stable at home ?-trying to cut back on alcohol, followed by hematology and GI ?-mammogram will be scheduled, also will call about colonoscopy repeat--got a letter to call to schedule ? ?Current Medication: ?Outpatient Encounter Medications as of 03/22/2022  ?Medication Sig  ? Apple Cider Vinegar 188 MG CAPS Take 1 capsule by mouth daily.  ? cholecalciferol (VITAMIN D3) 25 MCG (1000 UNIT) tablet Take 1,000 Units by mouth daily.  ? hyoscyamine (LEVSIN) 0.125 MG tablet TAKE 1 TABLET BY MOUTH EVERY 6 HOURS AS NEEDED FOR CRAMPING.  ? insulin lispro (HUMALOG) 100 UNIT/ML KwikPen Inject into the skin. Use at lunch with sliding scale may use up to 10 units   ? Insulin Lispro Prot & Lispro (HUMALOG 75/25 MIX) (75-25) 100 UNIT/ML Kwikpen Inject 20 Units into the skin 2 (two) times daily with a meal.  ? levETIRAcetam (KEPPRA) 500 MG tablet Take 1 tablet (500 mg total) by mouth 2 (two) times daily. (Patient taking differently: Take 500 mg by mouth daily. Taking 1/2 tab QD)  ? losartan (COZAAR) 50 MG tablet Take 1 tablet (50 mg total) by mouth daily.  ? rOPINIRole (REQUIP) 0.25 MG tablet TAKE 1 TO 2 TABLETS BY MOUTH AT BEDTIME AS NEEDED  ? vitamin B-12 (CYANOCOBALAMIN) 100 MCG tablet Take 100 mcg by mouth daily.  ? [DISCONTINUED] amLODipine (NORVASC) 5 MG tablet Take 1 tablet (5 mg  total) by mouth daily.  ? [DISCONTINUED] glucose blood (ONETOUCH ULTRA) test strip Use as directed 5 times with sliding scale DX E11.65  ? [DISCONTINUED] Insulin Pen Needle (BD PEN NEEDLE NANO 2ND GEN) 32G X 4 MM MISC USE AS DIRECTED 4 times a day with insulin pens  ? [DISCONTINUED] levothyroxine (SYNTHROID) 50 MCG tablet Take 1 tablet (50 mcg total) by mouth daily before breakfast.  ? glucose blood (ONETOUCH ULTRA) test strip Use as directed 5 times with sliding scale DX E11.65  ? Insulin Pen Needle (BD PEN NEEDLE NANO 2ND GEN) 32G X 4 MM MISC USE AS DIRECTED 4 times a day with insulin pens  ? levothyroxine (SYNTHROID) 50 MCG tablet Take 1 tablet (50 mcg total) by mouth daily before breakfast.  ? [DISCONTINUED] aspirin EC 81 MG tablet Take 81 mg by mouth daily.  ? [DISCONTINUED] fluconazole (DIFLUCAN) 150 MG tablet Take 1 tablet by mouth once and may repeat in 3 days if symptoms persist  ? [DISCONTINUED] nitrofurantoin, macrocrystal-monohydrate, (MACROBID) 100 MG capsule Take 1 cap twice per day for 10 days. (Patient not taking: Reported on 03/22/2022)  ? ?No facility-administered encounter medications on file as of 03/22/2022.  ? ? ?Surgical History: ?Past Surgical History:  ?Procedure Laterality Date  ? CESAREAN SECTION    ? COLONOSCOPY WITH PROPOFOL N/A 03/29/2016  ? Procedure: COLONOSCOPY WITH PROPOFOL;  Surgeon: Josefine Class, MD;  Location: [facility] ENDOSCOPY;  Service: Endoscopy;  Laterality: N/A;  ? ESOPHAGOGASTRODUODENOSCOPY (EGD) WITH PROPOFOL N/A 09/20/2021  ? Procedure: ESOPHAGOGASTRODUODENOSCOPY (EGD) WITH PROPOFOL;  Surgeon: Jonathon Bellows, MD;  Location: [facility] ENDOSCOPY;  Service: Gastroenterology;  Laterality: N/A;  ? TUBAL LIGATION    ? ? ?Medical History: ?Past Medical History:  ?Diagnosis Date  ? Bipolar disorder ([place])   ? Depression   ? Diabetes mellitus without complication ([place])   ? History of seizure 2020  ? history of 1 seizure  ? Hyperlipidemia   ? Hypertension   ? ? ?Family History: ?Family  History  ?Problem Relation Age of Onset  ? Diabetes Mother   ? Hypertension Mother   ? Breast cancer Sister   ? ? ?Social History  ? ?Socioeconomic History  ? Marital status: Married  ?  Spouse name: Not on file  ? Number of children: Not on file  ? Years of education: Not on file  ? Highest education level: Not on file  ?Occupational History  ? Not on file  ?Tobacco Use  ? Smoking status: Every Day  ?  Packs/day: 0.50  ?  Years: 25.00  ?  Pack years: 12.50  ?  Types: Cigarettes  ? Smokeless tobacco: Never  ? Tobacco comments:  ?  6 -7  cigarettes   ?Vaping Use  ? Vaping Use: Never used  ?Substance and Sexual Activity  ? Alcohol use: Yes  ?  Alcohol/week: 2.0 standard drinks  ?  Types: 2 Cans of beer per week  ?  Comment: daily   ? Drug use: No  ? Sexual activity: Not on file  ?Other Topics Concern  ? Not on file  ?Social History Narrative  ? Not on file  ? ?Social Determinants of Health  ? ?Financial Resource Strain: Not on file  ?Food Insecurity: Not on file  ?Transportation Needs: Not on file  ?Physical Activity: Not on file  ?Stress: Not on file  ?Social Connections: Not on file  ?Intimate Partner Violence: Not on file  ? ? ? ? ?Review of Systems  ?Constitutional:  Negative for chills, fatigue and unexpected weight change.  ?HENT:  Negative for congestion, postnasal drip, rhinorrhea, sneezing and sore throat.   ?Eyes:  Negative for redness.  ?Respiratory:  Negative for cough, chest tightness and shortness of breath.   ?Cardiovascular:  Negative for chest pain and palpitations.  ?Gastrointestinal:  Negative for abdominal pain, constipation, diarrhea, nausea and vomiting.  ?Genitourinary:  Negative for dysuria and frequency.  ?Musculoskeletal:  Negative for arthralgias, back pain, joint swelling and neck pain.  ?Skin:  Negative for rash.  ?Neurological: Negative.  Negative for tremors and numbness.  ?Hematological:  Negative for adenopathy. Does not bruise/bleed easily.  ?Psychiatric/Behavioral:  Negative for  behavioral problems (Depression), sleep disturbance and suicidal ideas. The patient is not nervous/anxious.   ? ?Vital Signs: ?BP 136/78   Pulse 94   Temp 98.7 ?F (37.1 ?C)   Resp 16   Ht '5\' 5"'$  (1.651 m)   Wt 179 lb (81.2 kg)   LMP 11/07/2015 (Approximate)   SpO2 97%   BMI 29.79 kg/m?  ? ? ?Physical Exam ?Vitals and nursing note reviewed.  ?Constitutional:   ?   General: She is not in acute distress. ?   Appearance: She is well-developed. She is obese. She is not diaphoretic.  ?HENT:  ?   Head: Normocephalic and atraumatic.  ?   Mouth/Throat:  ?   Pharynx: No oropharyngeal exudate.  ?Eyes:  ?   Pupils: Pupils are equal, round,  and reactive to light.  ?Neck:  ?   Thyroid: No thyromegaly.  ?   Vascular: No JVD.  ?   Trachea: No tracheal deviation.  ?Cardiovascular:  ?   Rate and Rhythm: Normal rate and regular rhythm.  ?   Heart sounds: Normal heart sounds. No murmur heard. ?  No friction rub. No gallop.  ?Pulmonary:  ?   Effort: Pulmonary effort is normal. No respiratory distress.  ?   Breath sounds: No wheezing or rales.  ?Chest:  ?   Chest wall: No tenderness.  ?Abdominal:  ?   General: Bowel sounds are normal.  ?   Palpations: Abdomen is soft.  ?Musculoskeletal:     ?   General: Normal range of motion.  ?   Cervical back: Normal range of motion and neck supple.  ?Feet:  ?   Right foot:  ?   Toenail Condition: Right toenails are abnormally thick. Fungal disease present. ?   Left foot:  ?   Toenail Condition: Left toenails are abnormally thick. Fungal disease present. ?Lymphadenopathy:  ?   Cervical: No cervical adenopathy.  ?Skin: ?   General: Skin is warm and dry.  ?Neurological:  ?   Mental Status: She is alert and oriented to person, place, and time.  ?   Cranial Nerves: No cranial nerve deficit.  ?Psychiatric:     ?   Behavior: Behavior normal.     ?   Thought Content: Thought content normal.     ?   Judgment: Judgment normal.  ? ? ? ? ? ?Assessment/Plan: ?1. Uncontrolled type 2 diabetes mellitus with  hyperglycemia (HCC) ?- POCT HgB A1C is 6.4 which is improved from 6.6 last check. Continue current medications and work on diet and exercise. ?- Insulin Pen Needle (BD PEN NEEDLE NANO 2ND GEN) 32G X 4 MM MISC; USE AS DIR

## 2022-03-30 ENCOUNTER — Other Ambulatory Visit: Payer: Self-pay | Admitting: Physician Assistant

## 2022-03-30 DIAGNOSIS — I10 Essential (primary) hypertension: Secondary | ICD-10-CM

## 2022-04-11 DIAGNOSIS — H40003 Preglaucoma, unspecified, bilateral: Secondary | ICD-10-CM | POA: Diagnosis not present

## 2022-04-25 ENCOUNTER — Ambulatory Visit: Payer: BC Managed Care – PPO | Admitting: Gastroenterology

## 2022-04-25 ENCOUNTER — Telehealth: Payer: Self-pay

## 2022-04-25 NOTE — Telephone Encounter (Signed)
Patient called stating that she needed to cancel her office visit because her husband had surgery yesterday and she can't not leave him alone. Patient doesn't have MyChart so I had no choice but to reschedule her appointment. Patient also asked what was the purpose of her appointment. I told her that it was to discuss her appointment with her ophthalmologist and what were her next steps. Patient stated that she went to her ophthalmologist appointment and was told that everything was normal. I told her that I would get in contact with them so we could obtain their notes so Dr. Vicente Males could review and when she came back to see Dr. Vicente Males, that he will go over the results and what needed to be done next. Patient understood and had no further questions. ?Patient was rescheduled to come in on 06/13/2022 at 1:15 PM. ?

## 2022-04-25 NOTE — Progress Notes (Deleted)
Jonathon Bellows MD, MRCP(U.K) [address]  [place]  [place], [place] [address]  Main: [phone]  Fax: [phone]   Primary Care Physician: Mylinda Latina, PA-C  Primary Gastroenterologist:  Dr. Jonathon Bellows   No chief complaint on file.   HPI: Abigail Rice is a 56 y.o. female Summary of history :   Initially referred and seen on 07/25/2021 for elevated LFTs.  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 History of heavy alcohol consumption 3-4 beers per day.  Differentials being considered are ITP and chronic alcohol consumption/liver disease.  Undergone a bone marrow biopsy in MarchDenies any illegal drug use    06/06/2021: Hemoglobin 11.2 g with a platelet count of 40 MCV of 99.7, albumin 3.3 alkaline phosphatase of 176, AST of 67, ALT of 60 hemochromatosis genetic testing showed no mutation.  HIV testing was negative acute hepatitis panel showed no active hepatitis B surface antigen or hepatitis C virus antibody.  B12 levels was normal in January 2022 iron studies showed increased iron percentage saturation of 76% and a ferritin of 595 08/10/2021: Ultrasound right upper quadrant showed lobular hepatic contour with nodularity suspicious for early cirrhosis.    07/25/2021: Elevated CK2 96, hepatitis B E antigen positive not immune to hepatitis A ceruloplasmin low otherwise autoimmune screen, celiac serology was negative.  Elevated IgE    08/15/2021 TSH normal.  CMP shows elevated alkaline phosphatase 307 along with transaminases hemoglobin 11.3 g hepatitis delta PCR not detected, hepatitis B DNA not detected CK elevated at 24-hour urinary copper excretion within normal limits 09/20/2021: EGD: Schatzki's ring found in the lower third of the esophagus nonobstructing.  No esophageal varices seen 10/31/2021: Ceruloplasmin low 16.7 , IGE elevated 1766, Hep B e antigen negative    Interval history  01/31/2022-04/25/2022  01/31/2022:: CK: Almost back to normal.  IgE elevated at  1655, creatinine 1.08 albumin 3.6 alkaline phosphatase 227 AST 57 ALT 47 INR 1.3 hemochromatosis gene not detected GGT 189 hepatitis B surface antibody nonreactive 03/08/2022: Right upper quadrant ultrasound shows cirrhosis    Still drinking since last visit .  Current Outpatient Medications  Medication Sig Dispense Refill   amLODipine (NORVASC) 5 MG tablet TAKE 1 TABLET (5 MG TOTAL) BY MOUTH DAILY. 90 tablet 1   Apple Cider Vinegar 188 MG CAPS Take 1 capsule by mouth daily.     cholecalciferol (VITAMIN D3) 25 MCG (1000 UNIT) tablet Take 1,000 Units by mouth daily.     glucose blood (ONETOUCH ULTRA) test strip Use as directed 5 times with sliding scale DX E11.65 300 strip 1   hyoscyamine (LEVSIN) 0.125 MG tablet TAKE 1 TABLET BY MOUTH EVERY 6 HOURS AS NEEDED FOR CRAMPING. 360 tablet 1   insulin lispro (HUMALOG) 100 UNIT/ML KwikPen Inject into the skin. Use at lunch with sliding scale may use up to 10 units [place] 15 mL 11   Insulin Lispro Prot & Lispro (HUMALOG 75/25 MIX) (75-25) 100 UNIT/ML Kwikpen Inject 20 Units into the skin 2 (two) times daily with a meal. 15 mL 11   Insulin Pen Needle (BD PEN NEEDLE NANO 2ND GEN) 32G X 4 MM MISC USE AS DIRECTED 4 times a day with insulin pens 400 each 3   levETIRAcetam (KEPPRA) 500 MG tablet Take 1 tablet (500 mg total) by mouth 2 (two) times daily. (Patient taking differently: Take 500 mg by mouth daily. Taking 1/2 tab QD) 180 tablet 1   levothyroxine (SYNTHROID) 50 MCG tablet Take 1 tablet (  50 mcg total) by mouth daily before breakfast. 90 tablet 1   losartan (COZAAR) 50 MG tablet Take 1 tablet (50 mg total) by mouth daily. 90 tablet 3   rOPINIRole (REQUIP) 0.25 MG tablet TAKE 1 TO 2 TABLETS BY MOUTH AT BEDTIME AS NEEDED 180 tablet 0   vitamin B-12 (CYANOCOBALAMIN) 100 MCG tablet Take 100 mcg by mouth daily.     No current facility-administered medications for this visit.    Allergies as of 04/25/2022 - Review Complete 03/22/2022  Allergen Reaction Noted    Ace inhibitors  03/28/2016   Ciprofloxacin  08/16/2019   Lisinopril  01/23/2018   Risperdal [risperidone]  01/17/2021   Vytorin [ezetimibe-simvastatin]  03/28/2016    ROS:  General: Negative for anorexia, weight loss, fever, chills, fatigue, weakness. ENT: Negative for hoarseness, difficulty swallowing , nasal congestion. CV: Negative for chest pain, angina, palpitations, dyspnea on exertion, peripheral edema.  Respiratory: Negative for dyspnea at rest, dyspnea on exertion, cough, sputum, wheezing.  GI: See history of present illness. GU:  Negative for dysuria, hematuria, urinary incontinence, urinary frequency, nocturnal urination.  Endo: Negative for unusual weight change.    Physical Examination:   LMP 11/07/2015 (Approximate)   General: Well-nourished, well-developed in no acute distress.  Eyes: No icterus. Conjunctivae pink. Mouth: Oropharyngeal mucosa moist and pink , no lesions erythema or exudate. Lungs: Clear to auscultation bilaterally. Non-labored. Heart: Regular rate and rhythm, no murmurs rubs or gallops.  Abdomen: Bowel sounds are normal, nontender, nondistended, no hepatosplenomegaly or masses, no abdominal bruits or hernia , no rebound or guarding.   Extremities: No lower extremity edema. No clubbing or deformities. Neuro: Alert and oriented x 3.  Grossly intact. Skin: Warm and dry, no jaundice.   Psych: Alert and cooperative, normal mood and affect.   Imaging Studies: No results found.  Assessment and Plan:   Abigail Rice is a 56 y.o. y/o female  here to follow-up for abnormal LFTs history of excess alcohol consumption.  Follows with Dr. Tasia Catchings in hematology.  Underwent a bone marrow biopsy.  Recent ultrasound shows features of possible cirrhosis.  She has had multiple labs which are abnormal but which are not diagnostic of any condition.  Needs further evaluation.  It is very possible that the elevated immunoglobulins, ferritin, CK may be due to the effects of  alcohol. False positive Hep B eantigen.  Continues to drink alcohol advised to strongly stop May return in February 2023   Plan 1.  Serum ceruloplasmin was low but 24-hour urinary copper was in the normal range.  Follow-up on ophthalmology evaluation for [facility] rings which she has scheduled in April 2023.   Based on the results may need to obtain genetic testing for her liver biopsy down the road.  If does not stop drinking alcohol have advised that we may have no choice but to obtain liver biopsy.    2.  Right upper quadrant ultrasound in 09/11/2022   3.  Strongly suggest her to avoid all alcohol consumption    4.  EGD to screen for esophageal varices in 09/11/2024   5.  Needs hep A and B vaccine   6.  Elevated ferritin and transferrin saturation we will check HFE gene mutation to rule out hemochromatosis    Dr Jonathon Bellows  MD,MRCP [facility]) Follow up in ***

## 2022-04-25 NOTE — Telephone Encounter (Signed)
I did request her report from the ophthalmologist and she stated that she occasionally drinks alcohol.  ?

## 2022-05-01 ENCOUNTER — Encounter: Payer: Self-pay | Admitting: Physician Assistant

## 2022-05-01 NOTE — Telephone Encounter (Signed)
We finally received patient's report from [facility] and it stated that the patient did not have Katse-Fleishcher rings to suggest Wilson's disease. The report will be in patient's chart under media. ?

## 2022-05-02 NOTE — Telephone Encounter (Signed)
Patient's report form [facility] was sent to [place]. It was given to Dr. Vicente Males for review and a copy to be scanned in patient's chart. ?

## 2022-06-13 ENCOUNTER — Other Ambulatory Visit: Payer: Self-pay

## 2022-06-13 ENCOUNTER — Encounter: Payer: Self-pay | Admitting: Gastroenterology

## 2022-06-13 ENCOUNTER — Other Ambulatory Visit: Payer: Self-pay | Admitting: Gastroenterology

## 2022-06-13 ENCOUNTER — Ambulatory Visit (INDEPENDENT_AMBULATORY_CARE_PROVIDER_SITE_OTHER): Payer: BC Managed Care – PPO | Admitting: Gastroenterology

## 2022-06-13 VITALS — BP 137/83 | HR 99 | Temp 98.4°F | Wt 179.0 lb

## 2022-06-13 DIAGNOSIS — K7030 Alcoholic cirrhosis of liver without ascites: Secondary | ICD-10-CM | POA: Diagnosis not present

## 2022-06-13 DIAGNOSIS — Z23 Encounter for immunization: Secondary | ICD-10-CM

## 2022-06-13 DIAGNOSIS — R7989 Other specified abnormal findings of blood chemistry: Secondary | ICD-10-CM

## 2022-06-13 DIAGNOSIS — R748 Abnormal levels of other serum enzymes: Secondary | ICD-10-CM

## 2022-06-13 NOTE — Patient Instructions (Signed)
Please come back in a month so you could receive your second dosage of Hepatitis A vaccine.

## 2022-06-13 NOTE — Addendum Note (Signed)
Addended by: Wayna Chalet on: 06/13/2022 02:05 PM   Modules accepted: Orders

## 2022-06-13 NOTE — Progress Notes (Signed)
Jonathon Bellows MD, MRCP(U.K) [address]  [place]  [place], [place] [address]  Main: [phone]  Fax: [phone]   Primary Care Physician: Mylinda Latina, PA-C  Primary Gastroenterologist:  Dr. Jonathon Bellows   Chief Complaint  Patient presents with   Abnormal LFTs    HPI: Abigail Rice is a 56 y.o. female  Summary of history :   Initially referred and seen on 07/25/2021 for elevated LFTs.  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 History of heavy alcohol consumption 3-4 beers per day.  Differentials being considered are ITP and chronic alcohol consumption/liver disease.  Undergone a bone marrow biopsy in MarchDenies any illegal drug use    06/06/2021: Hemoglobin 11.2 g with a platelet count of 40 MCV of 99.7, albumin 3.3 alkaline phosphatase of 176, AST of 67, ALT of 60 hemochromatosis genetic testing showed no mutation.  HIV testing was negative acute hepatitis panel showed no active hepatitis B surface antigen or hepatitis C virus antibody.  B12 levels was normal in January 2022 iron studies showed increased iron percentage saturation of 76% and a ferritin of 595 08/10/2021: Ultrasound right upper quadrant showed lobular hepatic contour with nodularity suspicious for early cirrhosis.    07/25/2021: Elevated CK2 96, hepatitis B E antigen positive not immune to hepatitis A ceruloplasmin low otherwise autoimmune screen, celiac serology was negative.  Elevated IgE    08/15/2021 TSH normal.  CMP shows elevated alkaline phosphatase 307 along with transaminases hemoglobin 11.3 g hepatitis delta PCR not detected, hepatitis B DNA not detected CK elevated at 24-hour urinary copper excretion within normal limits  09/20/2021: EGD: Schatzki's ring found in the lower third of the esophagus nonobstructing.  No esophageal varices seen   10/31/2021: Ceruloplasmin low 16.7 , IGE elevated 1766, Hep B e antigen negative   Interval history  01/31/2022-06/13/2022  May 2023 eye  exam showed no evidence of [employer] rings  01/31/2022: Hemochromatosis gene not detected,, GT elevated, alkaline phosphatase 242 ALT 61 AST 70 creatinine 1.25 total bilirubin 0.7, hemoglobin 10.6 HbA1c 6.4 hepatitis B surface antibody not reactive 03/06/2022: RUQ [id number]   Still drinking alcohol 4 cans of beer a day.  Not tried to get any help but wants to stop on her own.  No other complaints.    Current Outpatient Medications  Medication Sig Dispense Refill   amLODipine (NORVASC) 5 MG tablet TAKE 1 TABLET (5 MG TOTAL) BY MOUTH DAILY. 90 tablet 1   Apple Cider Vinegar 188 MG CAPS Take 1 capsule by mouth daily.     cholecalciferol (VITAMIN D3) 25 MCG (1000 UNIT) tablet Take 1,000 Units by mouth daily.     glucose blood (ONETOUCH ULTRA) test strip Use as directed 5 times with sliding scale DX E11.65 300 strip 1   hyoscyamine (LEVSIN) 0.125 MG tablet TAKE 1 TABLET BY MOUTH EVERY 6 HOURS AS NEEDED FOR CRAMPING. 360 tablet 1   insulin lispro (HUMALOG) 100 UNIT/ML KwikPen Inject into the skin. Use at lunch with sliding scale may use up to 10 units [place] 15 mL 11   Insulin Lispro Prot & Lispro (HUMALOG 75/25 MIX) (75-25) 100 UNIT/ML Kwikpen Inject 20 Units into the skin 2 (two) times daily with a meal. 15 mL 11   Insulin Pen Needle (BD PEN NEEDLE NANO 2ND GEN) 32G X 4 MM MISC USE AS DIRECTED 4 times a day with insulin pens 400 each 3   levETIRAcetam (KEPPRA) 500 MG tablet Take 1 tablet (500 mg  total) by mouth 2 (two) times daily. (Patient taking differently: Take 500 mg by mouth daily. Taking 1/2 tab QD) 180 tablet 1   levothyroxine (SYNTHROID) 50 MCG tablet Take 1 tablet (50 mcg total) by mouth daily before breakfast. 90 tablet 1   losartan (COZAAR) 50 MG tablet Take 1 tablet (50 mg total) by mouth daily. 90 tablet 3   rOPINIRole (REQUIP) 0.25 MG tablet TAKE 1 TO 2 TABLETS BY MOUTH AT BEDTIME AS NEEDED 180 tablet 0   No current facility-administered medications for this visit.     Allergies as of 06/13/2022 - Review Complete 06/13/2022  Allergen Reaction Noted   Ace inhibitors  03/28/2016   Ciprofloxacin  08/16/2019   Lisinopril  01/23/2018   Risperdal [risperidone]  01/17/2021   Vytorin [ezetimibe-simvastatin]  03/28/2016    ROS:  General: Negative for anorexia, weight loss, fever, chills, fatigue, weakness. ENT: Negative for hoarseness, difficulty swallowing , nasal congestion. CV: Negative for chest pain, angina, palpitations, dyspnea on exertion, peripheral edema.  Respiratory: Negative for dyspnea at rest, dyspnea on exertion, cough, sputum, wheezing.  GI: See history of present illness. GU:  Negative for dysuria, hematuria, urinary incontinence, urinary frequency, nocturnal urination.  Endo: Negative for unusual weight change.    Physical Examination:   BP 137/83   Pulse 99   Temp 98.4 F (36.9 C) (Oral)   Wt 179 lb (81.2 kg)   LMP 11/07/2015 (Approximate)   BMI 29.79 kg/m   General: Well-nourished, well-developed in no acute distress.  Eyes: No icterus. Conjunctivae pink. Neuro: Alert and oriented x 3.  Grossly intact. Skin: Warm and dry, no jaundice.   Psych: Alert and cooperative, normal mood and affect.  MELD 3.0: 11 at 01/31/2022  2:22 PM MELD-Na: 10 at 01/31/2022  2:22 PM Calculated from: Serum Creatinine: 1.08 mg/dL at 01/31/2022  2:22 PM Serum Sodium: 137 mmol/L at 01/31/2022  2:22 PM Total Bilirubin: 0.6 mg/dL (Using min of 1 mg/dL) at 01/31/2022  2:22 PM Serum Albumin: 3.6 g/dL (Using max of 3.5 g/dL) at 01/31/2022  2:22 PM INR(ratio): 1.3 at 01/31/2022  2:22 PM Age at listing (hypothetical): 74 years Sex: Female at 01/31/2022  2:22 PM   Imaging Studies: No results found.  Assessment and Plan:   Abigail Rice is a 56 y.o. y/o female  here to follow-up for abnormal LFTs history of excess alcohol consumption.  Follows with Dr. Tasia Catchings in hematology.  Underwent a bone marrow biopsy. ultrasound shows features of possible cirrhosis.  She has had  multiple labs which are abnormal but which are not diagnostic of any condition. It is very possible that the elevated immunoglobulins, ferritin, CK may be due to the effects of alcohol. False positive Hep B eantigen.  Continues to drink alcohol advised to strongly stop     Plan 1.  No evidence of KF rings, 24-hour urinary copper normal.  PTH, vitamin D, fractionated alkaline phosphatase will be checked in view of elevated alkaline phosphatase check INR to calculate meld score   2.  MRI MRCP in 09/11/2022 to evaluate for [facility] as well as for abnormally elevated alkaline phosphatase if negative will need liver biopsy   3.  Strongly suggest her to avoid all alcohol consumption, advised to join alcoholic Anonymous   4.  EGD to screen for esophageal varices in 09/11/2024   5. Needs hep A and B vaccine we will give her first dose today     Dr Jonathon Bellows  MD,MRCP [facility]) Follow up in 6  months

## 2022-06-13 NOTE — Addendum Note (Signed)
Addended by: Wayna Chalet on: 06/13/2022 01:59 PM   Modules accepted: Orders

## 2022-06-14 LAB — COMPREHENSIVE METABOLIC PANEL
ALT: 71 IU/L — ABNORMAL HIGH (ref 0–32)
AST: 62 IU/L — ABNORMAL HIGH (ref 0–40)
Albumin/Globulin Ratio: 0.7 — ABNORMAL LOW (ref 1.2–2.2)
Albumin: 2.9 g/dL — ABNORMAL LOW (ref 3.8–4.9)
Alkaline Phosphatase: 323 IU/L — ABNORMAL HIGH (ref 44–121)
BUN/Creatinine Ratio: 12 (ref 9–23)
BUN: 12 mg/dL (ref 6–24)
Bilirubin Total: 0.7 mg/dL (ref 0.0–1.2)
CO2: 19 mmol/L — ABNORMAL LOW (ref 20–29)
Calcium: 8 mg/dL — ABNORMAL LOW (ref 8.7–10.2)
Chloride: 101 mmol/L (ref 96–106)
Creatinine, Ser: 1.04 mg/dL — ABNORMAL HIGH (ref 0.57–1.00)
Globulin, Total: 4.2 g/dL (ref 1.5–4.5)
Glucose: 249 mg/dL — ABNORMAL HIGH (ref 70–99)
Potassium: 4 mmol/L (ref 3.5–5.2)
Sodium: 133 mmol/L — ABNORMAL LOW (ref 134–144)
Total Protein: 7.1 g/dL (ref 6.0–8.5)
eGFR: 63 mL/min/{1.73_m2} (ref >59)

## 2022-06-14 LAB — PTH, INTACT AND CALCIUM
Calcium: 8.1 mg/dL — ABNORMAL LOW (ref 8.7–10.2)
PTH: 31 pg/mL (ref 15–65)

## 2022-06-14 LAB — PROTIME-INR
INR: 1.4 — ABNORMAL HIGH (ref 0.9–1.2)
Prothrombin Time: 14.7 s — ABNORMAL HIGH (ref 9.1–12.0)

## 2022-06-17 LAB — ALKALINE PHOSPHATASE, ISOENZYMES
Alkaline Phosphatase: 327 IU/L — ABNORMAL HIGH (ref 44–121)
BONE FRACTION: 82 % — ABNORMAL HIGH (ref 14–68)
INTESTINAL FRAC.: 4 % (ref 0–18)
LIVER FRACTION: 14 % — ABNORMAL LOW (ref 18–85)

## 2022-06-17 LAB — TSH: TSH: 2.56 u[IU]/mL (ref 0.450–4.500)

## 2022-06-21 ENCOUNTER — Ambulatory Visit: Payer: BC Managed Care – PPO | Admitting: Physician Assistant

## 2022-06-21 ENCOUNTER — Encounter: Payer: Self-pay | Admitting: Physician Assistant

## 2022-06-21 VITALS — BP 135/80 | HR 84 | Temp 97.8°F | Resp 16 | Ht 65.0 in | Wt 178.0 lb

## 2022-06-21 DIAGNOSIS — K7030 Alcoholic cirrhosis of liver without ascites: Secondary | ICD-10-CM | POA: Diagnosis not present

## 2022-06-21 DIAGNOSIS — D696 Thrombocytopenia, unspecified: Secondary | ICD-10-CM

## 2022-06-21 DIAGNOSIS — I10 Essential (primary) hypertension: Secondary | ICD-10-CM

## 2022-06-21 DIAGNOSIS — R7989 Other specified abnormal findings of blood chemistry: Secondary | ICD-10-CM | POA: Diagnosis not present

## 2022-06-21 DIAGNOSIS — E1165 Type 2 diabetes mellitus with hyperglycemia: Secondary | ICD-10-CM | POA: Diagnosis not present

## 2022-06-21 LAB — POCT GLYCOSYLATED HEMOGLOBIN (HGB A1C): Hemoglobin A1C: 6.3 % — AB (ref 4.0–5.6)

## 2022-06-21 MED ORDER — AMLODIPINE BESYLATE 5 MG PO TABS: 5.0000 mg | ORAL_TABLET | Freq: Every day | ORAL | 1 refills | Status: DC

## 2022-06-21 MED ORDER — LOSARTAN POTASSIUM 50 MG PO TABS: 50.0000 mg | ORAL_TABLET | Freq: Every day | ORAL | 3 refills | Status: DC

## 2022-06-21 MED ORDER — INSULIN LISPRO (1 UNIT DIAL) 100 UNIT/ML (KWIKPEN): PEN_INJECTOR | SUBCUTANEOUS | 11 refills | Status: DC

## 2022-06-21 MED ORDER — INSULIN LISPRO PROT & LISPRO (75-25 MIX) 100 UNIT/ML KWIKPEN: PEN_INJECTOR | SUBCUTANEOUS | 11 refills | Status: DC

## 2022-06-21 NOTE — Progress Notes (Signed)
[facility] [place], [place] [address]  Internal MEDICINE  Office Visit Note  Patient Name: Abigail Rice  [id number]  [id number]  Date of Service: 06/21/2022  Chief Complaint  Patient presents with   Follow-up   Depression   Diabetes   Hyperlipidemia   Hypertension    HPI Pt is here for routine follow up -Taking 1 in Am and 2 at PM of Hyoscyamine, but received notice from insurance that this was no longer covered and preferred  methscopolamine or dycyclomine. She would rather stick with original and see what the cost is. Has 90day supply currently and does not need this now. -Having lots of cramping--did find low calcium on labs with GI and will start supplementing. Also discussed increasing her water intake and staying well hydrated -BG at home do fluctuate a lot. Some morning readings at 110 and others at 200 based on the night before/snacking, but A1c looks great. Found test strips high, but cheaper OTC as recommended by pharmacy -BP stable  Current Medication: Outpatient Encounter Medications as of 06/21/2022  Medication Sig   Apple Cider Vinegar 188 MG CAPS Take 1 capsule by mouth daily.   cholecalciferol (VITAMIN D3) 25 MCG (1000 UNIT) tablet Take 1,000 Units by mouth daily.   glucose blood (ONETOUCH ULTRA) test strip Use as directed 5 times with sliding scale DX E11.65   hyoscyamine (LEVSIN) 0.125 MG tablet TAKE 1 TABLET BY MOUTH EVERY 6 HOURS AS NEEDED FOR CRAMPING.   Insulin Pen Needle (BD PEN NEEDLE NANO 2ND GEN) 32G X 4 MM MISC USE AS DIRECTED 4 times a day with insulin pens   levETIRAcetam (KEPPRA) 500 MG tablet Take 1 tablet (500 mg total) by mouth 2 (two) times daily. (Patient taking differently: Take 500 mg by mouth daily. Taking 1/2 tab QD)   levothyroxine (SYNTHROID) 50 MCG tablet Take 1 tablet (50 mcg total) by mouth daily before breakfast.   rOPINIRole (REQUIP) 0.25 MG tablet TAKE 1 TO 2 TABLETS BY MOUTH AT BEDTIME AS NEEDED    [DISCONTINUED] amLODipine (NORVASC) 5 MG tablet TAKE 1 TABLET (5 MG TOTAL) BY MOUTH DAILY.   [DISCONTINUED] insulin lispro (HUMALOG) 100 UNIT/ML KwikPen Inject into the skin. Use at lunch with sliding scale may use up to 10 units [place]   [DISCONTINUED] Insulin Lispro Prot & Lispro (HUMALOG 75/25 MIX) (75-25) 100 UNIT/ML Kwikpen Inject 20 Units into the skin 2 (two) times daily with a meal.   [DISCONTINUED] losartan (COZAAR) 50 MG tablet Take 1 tablet (50 mg total) by mouth daily.   amLODipine (NORVASC) 5 MG tablet Take 1 tablet (5 mg total) by mouth daily.   insulin lispro (HUMALOG) 100 UNIT/ML KwikPen Inject into the skin. Use at lunch with sliding scale may use up to 10 units [place]   Insulin Lispro Prot & Lispro (HUMALOG 75/25 MIX) (75-25) 100 UNIT/ML Kwikpen Inject 20 Units into the skin 2 (two) times daily with a meal.   losartan (COZAAR) 50 MG tablet Take 1 tablet (50 mg total) by mouth daily.   No facility-administered encounter medications on file as of 06/21/2022.    Surgical History: Past Surgical History:  Procedure Laterality Date   CESAREAN SECTION     COLONOSCOPY WITH PROPOFOL N/A 03/29/2016   Procedure: COLONOSCOPY WITH PROPOFOL;  Surgeon: Josefine Class, MD;  Location: [facility] ENDOSCOPY;  Service: Endoscopy;  Laterality: N/A;   ESOPHAGOGASTRODUODENOSCOPY (EGD) WITH PROPOFOL N/A 09/20/2021   Procedure: ESOPHAGOGASTRODUODENOSCOPY (EGD) WITH PROPOFOL;  Surgeon: Jonathon Bellows,  MD;  Location: [facility] ENDOSCOPY;  Service: Gastroenterology;  Laterality: N/A;   TUBAL LIGATION      Medical History: Past Medical History:  Diagnosis Date   Bipolar disorder ([place])    Depression    Diabetes mellitus without complication ([place])    History of seizure 2020   history of 1 seizure   Hyperlipidemia    Hypertension     Family History: Family History  Problem Relation Age of Onset   Diabetes Mother    Hypertension Mother    Breast cancer Sister     Social History   Socioeconomic History    Marital status: Married    Spouse name: Not on file   Number of children: Not on file   Years of education: Not on file   Highest education level: Not on file  Occupational History   Not on file  Tobacco Use   Smoking status: Every Day    Packs/day: 0.50    Years: 25.00    Total pack years: 12.50    Types: Cigarettes   Smokeless tobacco: Never   Tobacco comments:    6 -7  cigarettes   Vaping Use   Vaping Use: Never used  Substance and Sexual Activity   Alcohol use: Yes    Alcohol/week: 2.0 standard drinks of alcohol    Types: 2 Cans of beer per week    Comment: daily    Drug use: No   Sexual activity: Not on file  Other Topics Concern   Not on file  Social History Narrative   Not on file   Social Determinants of Health   Financial Resource Strain: Not on file  Food Insecurity: Not on file  Transportation Needs: Not on file  Physical Activity: Not on file  Stress: Not on file  Social Connections: Not on file  Intimate Partner Violence: Not on file      Review of Systems  Constitutional:  Negative for chills, fatigue and unexpected weight change.  HENT:  Negative for congestion, postnasal drip, rhinorrhea, sneezing and sore throat.   Eyes:  Negative for redness.  Respiratory:  Negative for cough, chest tightness and shortness of breath.   Cardiovascular:  Negative for chest pain and palpitations.  Gastrointestinal:  Negative for abdominal pain, constipation, diarrhea, nausea and vomiting.  Genitourinary:  Negative for dysuria and frequency.  Musculoskeletal:  Negative for arthralgias, back pain, joint swelling and neck pain.  Skin:  Negative for rash.  Neurological: Negative.  Negative for tremors and numbness.  Hematological:  Negative for adenopathy. Does not bruise/bleed easily.  Psychiatric/Behavioral:  Negative for behavioral problems (Depression), sleep disturbance and suicidal ideas. The patient is not nervous/anxious.     Vital Signs: BP 135/80    Pulse 84   Temp 97.8 F (36.6 C)   Resp 16   Ht '5\' 5"'$  (1.651 m)   Wt 178 lb (80.7 kg)   LMP 11/07/2015 (Approximate)   SpO2 100%   BMI 29.62 kg/m    Physical Exam Vitals and nursing note reviewed.  Constitutional:      General: She is not in acute distress.    Appearance: She is well-developed. She is obese. She is not diaphoretic.  HENT:     Head: Normocephalic and atraumatic.     Mouth/Throat:     Pharynx: No oropharyngeal exudate.  Eyes:     Pupils: Pupils are equal, round, and reactive to light.  Neck:     Thyroid: No thyromegaly.  Vascular: No JVD.     Trachea: No tracheal deviation.  Cardiovascular:     Rate and Rhythm: Normal rate and regular rhythm.     Heart sounds: Normal heart sounds. No murmur heard.    No friction rub. No gallop.  Pulmonary:     Effort: Pulmonary effort is normal. No respiratory distress.     Breath sounds: No wheezing or rales.  Chest:     Chest wall: No tenderness.  Abdominal:     General: Bowel sounds are normal.     Palpations: Abdomen is soft.  Musculoskeletal:        General: Normal range of motion.     Cervical back: Normal range of motion and neck supple.  Lymphadenopathy:     Cervical: No cervical adenopathy.  Skin:    General: Skin is warm and dry.  Neurological:     Mental Status: She is alert and oriented to person, place, and time.     Cranial Nerves: No cranial nerve deficit.  Psychiatric:        Behavior: Behavior normal.        Thought Content: Thought content normal.        Judgment: Judgment normal.        Assessment/Plan: 1. Uncontrolled type 2 diabetes mellitus with hyperglycemia (HCC) - POCT HgB A1C is 6.3 which is improving from 6.4 last check. Will continue current medications and monitoring - insulin lispro (HUMALOG) 100 UNIT/ML KwikPen; Inject into the skin. Use at lunch with sliding scale may use up to 10 units [place]  Dispense: 15 mL; Refill: 11  2. Essential hypertension Well controlled, continue  current medications - amLODipine (NORVASC) 5 MG tablet; Take 1 tablet (5 mg total) by mouth daily.  Dispense: 90 tablet; Refill: 1 - losartan (COZAAR) 50 MG tablet; Take 1 tablet (50 mg total) by mouth daily.  Dispense: 90 tablet; Refill: 3  3. Low serum calcium Will start supplementing OTC  4. Alcoholic cirrhosis of liver without ascites (HCC) Followed by GI and heme/onc  5. Thrombocytopenia ([place]) Followed by heme/onc   General Counseling: Shawntia verbalizes understanding of the findings of todays visit and agrees with plan of treatment. I have discussed any further diagnostic evaluation that may be needed or ordered today. We also reviewed her medications today. she has been encouraged to call the office with any questions or concerns that should arise related to todays visit.    Orders Placed This Encounter  Procedures   POCT HgB A1C    Meds ordered this encounter  Medications   Insulin Lispro Prot & Lispro (HUMALOG 75/25 MIX) (75-25) 100 UNIT/ML Kwikpen    Sig: Inject 20 Units into the skin 2 (two) times daily with a meal.    Dispense:  15 mL    Refill:  11   insulin lispro (HUMALOG) 100 UNIT/ML KwikPen    Sig: Inject into the skin. Use at lunch with sliding scale may use up to 10 units [place]    Dispense:  15 mL    Refill:  11   amLODipine (NORVASC) 5 MG tablet    Sig: Take 1 tablet (5 mg total) by mouth daily.    Dispense:  90 tablet    Refill:  1   losartan (COZAAR) 50 MG tablet    Sig: Take 1 tablet (50 mg total) by mouth daily.    Dispense:  90 tablet    Refill:  3    This patient was seen by Drema Dallas,  PA-C in collaboration with Dr. Clayborn Bigness as a part of collaborative care agreement.   Total time spent:30 Minutes Time spent includes review of chart, medications, test results, and follow up plan with the patient.      Dr Lavera Guise Internal medicine

## 2022-06-24 ENCOUNTER — Telehealth: Payer: Self-pay

## 2022-06-24 NOTE — Telephone Encounter (Signed)
Pt given 3 boxes of samples for Humalog 100 unit/ML KwikPen

## 2022-07-03 NOTE — Progress Notes (Signed)
Abigail Rice  Inform alkaline phosphotase is high related to the bones and not liver- needs to follow up with Mylinda Latina, PA-C for further evaluation

## 2022-07-08 ENCOUNTER — Telehealth: Payer: Self-pay

## 2022-07-08 NOTE — Telephone Encounter (Signed)
Sent mychart message

## 2022-07-08 NOTE — Telephone Encounter (Signed)
-----   Message from Jonathon Bellows, MD sent at 07/03/2022  9:54 AM EDT ----- Herb Grays  Inform alkaline phosphotase is high related to the bones and not liver- needs to follow up with Mylinda Latina, PA-C for further evaluation

## 2022-07-15 ENCOUNTER — Ambulatory Visit: Payer: BC Managed Care – PPO | Admitting: Oncology

## 2022-07-15 ENCOUNTER — Other Ambulatory Visit: Payer: BC Managed Care – PPO

## 2022-08-01 ENCOUNTER — Telehealth: Payer: Self-pay

## 2022-08-01 ENCOUNTER — Encounter: Payer: Self-pay | Admitting: Oncology

## 2022-08-01 ENCOUNTER — Inpatient Hospital Stay: Payer: BC Managed Care – PPO | Attending: Oncology

## 2022-08-01 ENCOUNTER — Inpatient Hospital Stay: Payer: BC Managed Care – PPO | Admitting: Oncology

## 2022-08-01 ENCOUNTER — Other Ambulatory Visit: Payer: Self-pay | Admitting: Gastroenterology

## 2022-08-01 VITALS — BP 136/84 | HR 86 | Temp 98.7°F | Resp 18 | Wt 176.6 lb

## 2022-08-01 DIAGNOSIS — Z803 Family history of malignant neoplasm of breast: Secondary | ICD-10-CM | POA: Insufficient documentation

## 2022-08-01 DIAGNOSIS — K921 Melena: Secondary | ICD-10-CM | POA: Diagnosis not present

## 2022-08-01 DIAGNOSIS — Z789 Other specified health status: Secondary | ICD-10-CM

## 2022-08-01 DIAGNOSIS — E119 Type 2 diabetes mellitus without complications: Secondary | ICD-10-CM | POA: Diagnosis not present

## 2022-08-01 DIAGNOSIS — K7030 Alcoholic cirrhosis of liver without ascites: Secondary | ICD-10-CM | POA: Insufficient documentation

## 2022-08-01 DIAGNOSIS — I10 Essential (primary) hypertension: Secondary | ICD-10-CM | POA: Diagnosis not present

## 2022-08-01 DIAGNOSIS — D696 Thrombocytopenia, unspecified: Secondary | ICD-10-CM

## 2022-08-01 DIAGNOSIS — D6949 Other primary thrombocytopenia: Secondary | ICD-10-CM | POA: Insufficient documentation

## 2022-08-01 DIAGNOSIS — D649 Anemia, unspecified: Secondary | ICD-10-CM

## 2022-08-01 DIAGNOSIS — F17210 Nicotine dependence, cigarettes, uncomplicated: Secondary | ICD-10-CM | POA: Diagnosis not present

## 2022-08-01 LAB — COMPREHENSIVE METABOLIC PANEL
ALT: 65 U/L — ABNORMAL HIGH (ref 0–44)
AST: 67 U/L — ABNORMAL HIGH (ref 15–41)
Albumin: 2.6 g/dL — ABNORMAL LOW (ref 3.5–5.0)
Alkaline Phosphatase: 214 U/L — ABNORMAL HIGH (ref 38–126)
Anion gap: 5 (ref 5–15)
BUN: 14 mg/dL (ref 6–20)
CO2: 24 mmol/L (ref 22–32)
Calcium: 8.5 mg/dL — ABNORMAL LOW (ref 8.9–10.3)
Chloride: 105 mmol/L (ref 98–111)
Creatinine, Ser: 1.23 mg/dL — ABNORMAL HIGH (ref 0.44–1.00)
GFR, Estimated: 52 mL/min — ABNORMAL LOW (ref >60)
Glucose, Bld: 155 mg/dL — ABNORMAL HIGH (ref 70–99)
Potassium: 3.7 mmol/L (ref 3.5–5.1)
Sodium: 134 mmol/L — ABNORMAL LOW (ref 135–145)
Total Bilirubin: 0.5 mg/dL (ref 0.3–1.2)
Total Protein: 7.2 g/dL (ref 6.5–8.1)

## 2022-08-01 LAB — CBC
HCT: 31.4 % — ABNORMAL LOW (ref 36.0–46.0)
Hemoglobin: 10.4 g/dL — ABNORMAL LOW (ref 12.0–15.0)
MCH: 32.5 pg (ref 26.0–34.0)
MCHC: 33.1 g/dL (ref 30.0–36.0)
MCV: 98.1 fL (ref 80.0–100.0)
Platelets: 42 10*3/uL — ABNORMAL LOW (ref 150–400)
RBC: 3.2 MIL/uL — ABNORMAL LOW (ref 3.87–5.11)
RDW: 13.2 % (ref 11.5–15.5)
WBC: 5.9 10*3/uL (ref 4.0–10.5)
nRBC: 0 % (ref 0.0–0.2)

## 2022-08-01 MED ORDER — HYDROCORTISONE (PERIANAL) 2.5 % EX CREA: 1.0000 | TOPICAL_CREAM | Freq: Two times a day (BID) | CUTANEOUS | 0 refills | Status: AC

## 2022-08-01 NOTE — Telephone Encounter (Signed)
Abigail Rice  Her hemoglobin appears stable within last 6 months.  Looks like she is bleeding from internal hemorrhoids based on the previous colonoscopy and her platelets are low which is likely contributing to bleeding from internal hemorrhoids.  Have her try Anusol cream twice daily per rectum for 7 to 10 days until she is seen on 9/14.  She will need planned colonoscopy scheduled because of severe thrombocytopenia after she sees Dr. Toma Aran, MD

## 2022-08-01 NOTE — Telephone Encounter (Signed)
Called patient but had to leave her a voicemail letting her know what Dr. Marius Ditch recommended for her. The prescription was sent to her [facility] in [place]. Appointment was also scheduled for 09/05/2022 and I let her know that she would be able to see it on MyChart. I also let her know that if she had further questions to call us back or send [place] a patient message.

## 2022-08-01 NOTE — Progress Notes (Signed)
Pt here for follow up. Pt reports that she has been having some bleeding when she has had BM's so she is concerned. She called GI last week to see if she could be set up for a colonoscopy but has not heard back. Reached out to Dr. Georgeann Oppenheim CMA to follow up regarding this.

## 2022-08-01 NOTE — Addendum Note (Signed)
Addended by: Wayna Chalet on: 08/01/2022 04:48 PM   Modules accepted: Orders

## 2022-08-01 NOTE — Telephone Encounter (Signed)
Benjamine Mola, RN at the [facility] wanted for [place] to schedule an appointment for patient since she was at their office for an appointment telling them that she had been having stools with some blood. They drew a CBC and a CMP today and the results are available to be seen. Benjamine Mola wanted to schedule an appointment for the patient to be seen sooner than December. I then offered 09/05/2022 and the patient wanted to be seen sooner. However, we do not have anything sooner. In the meantime, can you please review her labs and advise me on what to tell the patient. Thank you.

## 2022-08-03 DIAGNOSIS — K7030 Alcoholic cirrhosis of liver without ascites: Secondary | ICD-10-CM | POA: Insufficient documentation

## 2022-08-03 MED ORDER — DEXAMETHASONE 4 MG PO TABS: 40.0000 mg | ORAL_TABLET | Freq: Every day | ORAL | 0 refills | Status: AC

## 2022-08-03 NOTE — Progress Notes (Signed)
Hematology/Oncology Progress note [phone] [phone]      Patient Care Team: Carolynne Edouard as PCP - General (Physician Assistant) Earlie Server, MD as Consulting Physician (Oncology)  REFERRING PROVIDER: Lavera Guise, MD  CHIEF COMPLAINTS/REASON FOR VISIT:   thrombocytopenia  HISTORY OF PRESENTING ILLNESS:  Abigail Rice is a 56 y.o. female who was seen in consultation at the request of Lavera Guise, MD for evaluation of thrombocytopenia   Reviewed patient's labs done previously.  Labs showed decreased platelet counts at 43,000, wbc 4.1 hemoglobin 10.7 Reviewed patient's previous labs. Thrombocytopenia is chronic chronic onset , since at least 2019.  Platelet count in 2019 was 1 43,000.  Starting August 2020, platelet count has been in the range of 50-60,000 No aggravating or elevated factors.  Associated symptoms or signs:  Denies weight loss, fever, chills, fatigue, night sweats.  Denies hematochezia, hematuria, hematemesis, epistaxis, black tarry stool.  easy bruising.   Context:  History of hepatitis or HIV infection, denies  Patient has a history of heavy alcohol use.  She reports that she has cut down her alcohol intake since last year and currently she drinks 3-4 beer per day.  With a history of seizure and currently on Keppra.  #Thrombocytopenia increased to 80,000 after a course of dexamethasone 40 mg daily for 4 days. Platelet count further decreased to 38,000 a few weeks later.  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  Findings are favored to be reactive in nature.FISH results is normal.  INTERVAL HISTORY Abigail Rice is a 56 y.o. female who has above history reviewed by me today presents for follow up visit for management of thrombocytopenia. Intermittent blood in the stool. Currently she drinks 2 beers per day.  Per patient, she has cut back  alcohol consumption a lot.  Review of Systems  Constitutional:  Negative for appetite change, chills, fatigue and fever.  HENT:   Negative for hearing loss and voice change.   Eyes:  Negative for eye problems.  Respiratory:  Negative for chest tightness and cough.   Cardiovascular:  Negative for chest pain.  Gastrointestinal:  Negative for abdominal distention, abdominal pain and blood in stool.       Intermittent blood in the stool.  Endocrine: Negative for hot flashes.  Genitourinary:  Negative for difficulty urinating and frequency.   Musculoskeletal:  Negative for arthralgias.  Skin:  Negative for itching and rash.  Neurological:  Negative for extremity weakness.  Hematological:  Negative for adenopathy.  Psychiatric/Behavioral:  Negative for confusion.     MEDICAL HISTORY:  Past Medical History:  Diagnosis Date   Bipolar disorder ([place])    Depression    Diabetes mellitus without complication ([place])    History of seizure 2020   history of 1 seizure   Hyperlipidemia    Hypertension     SURGICAL HISTORY: Past Surgical History:  Procedure Laterality Date   CESAREAN SECTION     COLONOSCOPY WITH PROPOFOL N/A 03/29/2016   Procedure: COLONOSCOPY WITH PROPOFOL;  Surgeon: Josefine Class, MD;  Location[facility] ENDOSCOPY;  Service: Endoscopy;  Laterality: N/A;   ESOPHAGOGASTRODUODENOSCOPY (EGD) WITH PROPOFOL N/A 09/20/2021   Procedure: ESOPHAGOGASTRODUODENOSCOPY (EGD) WITH PROPOFOL;  Surgeon: Jonathon Bellows, MD;  Location: [facility] ENDOSCOPY;  Service: Gastroenterology;  Laterality: N/A;   TUBAL LIGATION      SOCIAL HISTORY: Social History   Socioeconomic History   Marital status: Married    Spouse name: Not  on file   Number of children: Not on file   Years of education: Not on file   Highest education level: Not on file  Occupational History   Not on file  Tobacco Use   Smoking status: Every Day    Packs/day: 0.50    Years: 25.00    Total pack years: 12.50    Types:  Cigarettes   Smokeless tobacco: Never   Tobacco comments:    6 -7  cigarettes   Vaping Use   Vaping Use: Never used  Substance and Sexual Activity   Alcohol use: Yes    Alcohol/week: 2.0 standard drinks of alcohol    Types: 2 Cans of beer per week    Comment: daily    Drug use: No   Sexual activity: Not on file  Other Topics Concern   Not on file  Social History Narrative   Not on file   Social Determinants of Health   Financial Resource Strain: Not on file  Food Insecurity: Not on file  Transportation Needs: Not on file  Physical Activity: Not on file  Stress: Not on file  Social Connections: Not on file  Intimate Partner Violence: Not on file    FAMILY HISTORY: Family History  Problem Relation Age of Onset   Diabetes Mother    Hypertension Mother    Breast cancer Sister     ALLERGIES:  is allergic to ace inhibitors, ciprofloxacin, lisinopril, risperdal [risperidone], and vytorin [ezetimibe-simvastatin].  MEDICATIONS:  Current Outpatient Medications  Medication Sig Dispense Refill   amLODipine (NORVASC) 5 MG tablet Take 1 tablet (5 mg total) by mouth daily. 90 tablet 1   Apple Cider Vinegar 188 MG CAPS Take 1 capsule by mouth daily.     cholecalciferol (VITAMIN D3) 25 MCG (1000 UNIT) tablet Take 1,000 Units by mouth daily.     hyoscyamine (LEVSIN) 0.125 MG tablet TAKE 1 TABLET BY MOUTH EVERY 6 HOURS AS NEEDED FOR CRAMPING. 360 tablet 1   insulin lispro (HUMALOG) 100 UNIT/ML KwikPen Inject into the skin. Use at lunch with sliding scale may use up to 10 units [place] 15 mL 11   Insulin Lispro Prot & Lispro (HUMALOG 75/25 MIX) (75-25) 100 UNIT/ML Kwikpen Inject 20 Units into the skin 2 (two) times daily with a meal. 15 mL 11   Insulin Pen Needle (BD PEN NEEDLE NANO 2ND GEN) 32G X 4 MM MISC USE AS DIRECTED 4 times a day with insulin pens 400 each 3   levETIRAcetam (KEPPRA) 500 MG tablet Take 1 tablet (500 mg total) by mouth 2 (two) times daily. (Patient taking differently:  Take 250 mg by mouth daily. Taking 1/2 tab QD) 180 tablet 1   levothyroxine (SYNTHROID) 50 MCG tablet Take 1 tablet (50 mcg total) by mouth daily before breakfast. 90 tablet 1   losartan (COZAAR) 50 MG tablet Take 1 tablet (50 mg total) by mouth daily. 90 tablet 3   rOPINIRole (REQUIP) 0.25 MG tablet TAKE 1 TO 2 TABLETS BY MOUTH AT BEDTIME AS NEEDED 180 tablet 0   hydrocortisone (ANUSOL-HC) 2.5 % rectal cream Place 1 Application rectally 2 (two) times daily for 10 days. 30 g 0   No current facility-administered medications for this visit.     PHYSICAL EXAMINATION: ECOG PERFORMANCE STATUS: 1 - Symptomatic but completely ambulatory Vitals:   08/01/22 1426  BP: 136/84  Pulse: 86  Resp: 18  Temp: 98.7 F (37.1 C)   Filed Weights   08/01/22 1426  Weight: 176  lb 9.6 oz (80.1 kg)    Physical Exam Constitutional:      General: She is not in acute distress. HENT:     Head: Normocephalic and atraumatic.  Eyes:     General: No scleral icterus. Cardiovascular:     Rate and Rhythm: Normal rate and regular rhythm.     Heart sounds: Normal heart sounds.  Pulmonary:     Effort: Pulmonary effort is normal. No respiratory distress.     Breath sounds: No wheezing.  Abdominal:     General: Bowel sounds are normal. There is no distension.     Palpations: Abdomen is soft.  Musculoskeletal:        General: No deformity. Normal range of motion.     Cervical back: Normal range of motion and neck supple.  Skin:    General: Skin is warm and dry.     Findings: No erythema or rash.  Neurological:     Mental Status: She is alert and oriented to person, place, and time. Mental status is at baseline.     Cranial Nerves: No cranial nerve deficit.     Coordination: Coordination normal.  Psychiatric:        Mood and Affect: Mood normal.      LABORATORY DATA:  I have reviewed the data as listed    Latest Ref Rng & Units 08/01/2022    2:09 PM 03/14/2022   10:09 AM 09/12/2021    9:40 AM  CBC   WBC 4.0 - 10.5 K/uL 5.9  4.8  5.6   Hemoglobin 12.0 - 15.0 g/dL 10.4  10.6  11.1   Hematocrit 36.0 - 46.0 % 31.4  31.5  33.4   Platelets 150 - 400 K/uL 42  36  39       Latest Ref Rng & Units 08/01/2022    2:09 PM 06/13/2022    2:07 PM 06/13/2022    2:05 PM  CMP  Glucose 70 - 99 mg/dL 155   249   BUN 6 - 20 mg/dL 14   12   Creatinine 0.44 - 1.00 mg/dL 1.23   1.04   Sodium 135 - 145 mmol/L 134   133   Potassium 3.5 - 5.1 mmol/L 3.7   4.0   Chloride 98 - 111 mmol/L 105   101   CO2 22 - 32 mmol/L 24   19   Calcium 8.9 - 10.3 mg/dL 8.5  8.1  8.0   Total Protein 6.5 - 8.1 g/dL 7.2   7.1   Total Bilirubin 0.3 - 1.2 mg/dL 0.5   0.7   Alkaline Phos 38 - 126 U/L 214   323   AST 15 - 41 U/L 67   62   ALT 0 - 44 U/L 65   71      Iron/TIBC/Ferritin/ %Sat    Component Value Date/Time   IRON 156 01/17/2021 1120   TIBC 206 (L) 01/17/2021 1120   FERRITIN 595 (H) 01/17/2021 1120   IRONPCTSAT 76 (H) 01/17/2021 1120      RADIOGRAPHIC STUDIES: I have personally reviewed the radiological images as listed and agreed with the findings in the report.    12/13/2020, ultrasound abdomen limited-images are not available to me Impression, mildly increased echogenicity of the liver.  Likely fatty infiltration.  Liver unremarkable contour, echotexture.  Normal size of liver and spleen.  There are innumerable tiny scattered calcifications throughout the spleen.  ASSESSMENT & PLAN:  1. Thrombocytopenia ([place])   2. 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 Normocytic anemia   4. Alcohol use   5. Blood in the stool    #Thrombocytopenia possible due to peripheral destruction-possible ITP, chronic liver disease/alcohol consumption. Labs reviewed and discussed with patient. Platelet count is 42,000. Her platelet counts previously increased with steroid use.  Given that she now has intermittent blood in stool, I recommend to dexamethasone 20m daily for 4 days, also potential  rituximab treatment in the future if needed.  I asked patient to update me weeks prior to future elective procedures.  Recommend CBC prior to procedures.  #Liver cirrhosis-recent ultrasound showed cirrhosis.  She follows up with gastroenterology Dr.Anna, MRI abdomen is scheduled. #Intermittent blood in stool.  Hb is stable.  Recommend patient to follow-up with gastroenterology.  #Alcohol use, alcohol cessation was discussed with patient.  Repeat CBC in 2-3 weeks.  #Follow-up 6 months Cc PCP  Orders Placed This Encounter  Procedures   CBC with Differential/Platelet    Standing Status:   Future    Standing Expiration Date:   08/02/2023   Comprehensive metabolic panel    Standing Status:   Future    Standing Expiration Date:   08/01/2023   CBC with Differential/Platelet    Standing Status:   Future    Standing Expiration Date:   08/02/2023   Comprehensive metabolic panel    Standing Status:   Future    Standing Expiration Date:   08/02/2023    All questions were answered. The patient knows to call the clinic with any problems questions or concerns.  Cc KLavera Guise MD   ZEarlie Server MD, PhD 08/03/2022

## 2022-08-05 ENCOUNTER — Telehealth: Payer: Self-pay

## 2022-08-05 DIAGNOSIS — D649 Anemia, unspecified: Secondary | ICD-10-CM

## 2022-08-05 NOTE — Telephone Encounter (Signed)
-----   Message from Earlie Server, MD sent at 08/03/2022 11:48 AM EDT ----- Please let her know that since she has intermitted blood in the stool, platelet is low, I recommend her to take steroid. Dexamethsone '40mg'$  daily x 4 days. Rx sent.  Please add lab encounter in 2-3 weeks with repeat cbc

## 2022-08-05 NOTE — Telephone Encounter (Signed)
Called and informed patient of labs results and Dr. Collie Siad recommendation. Patient verbalized understanding.   [place], please schedule patient for lab in 2-3 weeks. Please notify patient of appt. Thanks.

## 2022-08-19 ENCOUNTER — Encounter: Payer: BC Managed Care – PPO | Admitting: Physician Assistant

## 2022-08-27 ENCOUNTER — Ambulatory Visit: Payer: BC Managed Care – PPO | Admitting: Gastroenterology

## 2022-08-28 ENCOUNTER — Inpatient Hospital Stay: Payer: BC Managed Care – PPO

## 2022-08-29 ENCOUNTER — Ambulatory Visit: Payer: BC Managed Care – PPO | Admitting: Gastroenterology

## 2022-09-05 ENCOUNTER — Ambulatory Visit (INDEPENDENT_AMBULATORY_CARE_PROVIDER_SITE_OTHER): Payer: BC Managed Care – PPO | Admitting: Gastroenterology

## 2022-09-05 DIAGNOSIS — R7989 Other specified abnormal findings of blood chemistry: Secondary | ICD-10-CM

## 2022-09-05 DIAGNOSIS — D696 Thrombocytopenia, unspecified: Secondary | ICD-10-CM | POA: Diagnosis not present

## 2022-09-05 DIAGNOSIS — R748 Abnormal levels of other serum enzymes: Secondary | ICD-10-CM | POA: Diagnosis not present

## 2022-09-05 DIAGNOSIS — K625 Hemorrhage of anus and rectum: Secondary | ICD-10-CM

## 2022-09-05 DIAGNOSIS — K7030 Alcoholic cirrhosis of liver without ascites: Secondary | ICD-10-CM

## 2022-09-05 NOTE — Progress Notes (Signed)
Jonathon Bellows MD, MRCP(U.K) [address]  [place]  [place], [place] [address]  Main: [phone]  Fax: [phone]   Primary Care Physician: Mylinda Latina, PA-C  Primary Gastroenterologist:  Dr. Jonathon Bellows   Chief Complaint  Patient presents with   Rectal Bleeding    HPI: Abigail Rice is a 56 y.o. female  Summary of history :   Initially referred and seen on 07/25/2021 for elevated LFTs.  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 History of heavy alcohol consumption 3-4 beers per day.  Differentials being considered are ITP and chronic alcohol consumption/liver disease.  Undergone a bone marrow biopsy in MarchDenies any illegal drug use    06/06/2021: Hemoglobin 11.2 g with a platelet count of 40 MCV of 99.7, albumin 3.3 alkaline phosphatase of 176, AST of 67, ALT of 60 hemochromatosis genetic testing showed no mutation.  HIV testing was negative acute hepatitis panel showed no active hepatitis B surface antigen or hepatitis C virus antibody.  B12 levels was normal in January 2022 iron studies showed increased iron percentage saturation of 76% and a ferritin of 595 08/10/2021: Ultrasound right upper quadrant showed lobular hepatic contour with nodularity suspicious for early cirrhosis.    07/25/2021: Elevated CK2 96, hepatitis B E antigen positive not immune to hepatitis A ceruloplasmin low otherwise autoimmune screen, celiac serology was negative.  Elevated IgE    08/15/2021 TSH normal.  CMP shows elevated alkaline phosphatase 307 along with transaminases hemoglobin 11.3 g hepatitis delta PCR not detected, hepatitis B DNA not detected CK elevated at 24-hour urinary copper excretion within normal limits   09/20/2021: EGD: Schatzki's ring found in the lower third of the esophagus nonobstructing.  No esophageal varices seen   10/31/2021: Ceruloplasmin low 16.7 , IGE elevated 1766, Hep B e antigen negative May 2023 eye exam showed no evidence of [employer]  rings   01/31/2022: Hemochromatosis gene not detected,, GT elevated, alkaline phosphatase 242 ALT 61 AST 70 creatinine 1.25 total bilirubin 0.7, hemoglobin 10.6 HbA1c 6.4 hepatitis B surface antibody not reactive 03/06/2022: RUQ HAF:[id number]    Interval history 06/13/2022-09/05/2022   She called in with rectal bleeding a few weeks back discussed with Dr. Marius Ditch here for follow-up.  No rectal bleeding presently.  No other complaints presently still drinking beer. Was told was commenced on steroids by Dr. Tasia Catchings for her thrombocytopenia has not commenced on the same 06/13/2022: fractionated alk phos : elevated bone fraction     Current Outpatient Medications  Medication Sig Dispense Refill   amLODipine (NORVASC) 5 MG tablet Take 1 tablet (5 mg total) by mouth daily. 90 tablet 1   Apple Cider Vinegar 188 MG CAPS Take 1 capsule by mouth daily.     cholecalciferol (VITAMIN D3) 25 MCG (1000 UNIT) tablet Take 1,000 Units by mouth daily.     hyoscyamine (LEVSIN) 0.125 MG tablet TAKE 1 TABLET BY MOUTH EVERY 6 HOURS AS NEEDED FOR CRAMPING. 360 tablet 1   insulin lispro (HUMALOG) 100 UNIT/ML KwikPen Inject into the skin. Use at lunch with sliding scale may use up to 10 units [place] 15 mL 11   Insulin Lispro Prot & Lispro (HUMALOG 75/25 MIX) (75-25) 100 UNIT/ML Kwikpen Inject 20 Units into the skin 2 (two) times daily with a meal. 15 mL 11   Insulin Pen Needle (BD PEN NEEDLE NANO 2ND GEN) 32G X 4 MM MISC USE AS DIRECTED 4 times a day with insulin pens 400 each 3  levETIRAcetam (KEPPRA) 500 MG tablet Take 1 tablet (500 mg total) by mouth 2 (two) times daily. (Patient taking differently: Take 250 mg by mouth daily. Taking 1/2 tab QD) 180 tablet 1   levothyroxine (SYNTHROID) 50 MCG tablet Take 1 tablet (50 mcg total) by mouth daily before breakfast. 90 tablet 1   losartan (COZAAR) 50 MG tablet Take 1 tablet (50 mg total) by mouth daily. 90 tablet 3   rOPINIRole (REQUIP) 0.25 MG tablet TAKE 1 TO 2 TABLETS BY MOUTH AT  BEDTIME AS NEEDED 180 tablet 0   No current facility-administered medications for this visit.    Allergies as of 09/05/2022 - Review Complete 08/01/2022  Allergen Reaction Noted   Ace inhibitors  03/28/2016   Ciprofloxacin  08/16/2019   Lisinopril  01/23/2018   Risperdal [risperidone]  01/17/2021   Vytorin [ezetimibe-simvastatin]  03/28/2016    ROS:  General: Negative for anorexia, weight loss, fever, chills, fatigue, weakness. ENT: Negative for hoarseness, difficulty swallowing , nasal congestion. CV: Negative for chest pain, angina, palpitations, dyspnea on exertion, peripheral edema.  Respiratory: Negative for dyspnea at rest, dyspnea on exertion, cough, sputum, wheezing.  GI: See history of present illness. GU:  Negative for dysuria, hematuria, urinary incontinence, urinary frequency, nocturnal urination.  Endo: Negative for unusual weight change.    Physical Examination:   LMP 11/07/2015 (Approximate)   General: Well-nourished, well-developed in no acute distress.  Eyes: No icterus. Conjunctivae pink. Mouth: Oropharyngeal mucosa moist and pink , no lesions erythema or exudate. Lungs: Clear to auscultation bilaterally. Non-labored. Heart: Regular rate and rhythm, no murmurs rubs or gallops.  Abdomen: Bowel sounds are normal, nontender, nondistended, no hepatosplenomegaly or masses, no abdominal bruits or hernia , no rebound or guarding.   Extremities: No lower extremity edema. No clubbing or deformities. Neuro: Alert and oriented x 3.  Grossly intact. Skin: Warm and dry, no jaundice.   Psych: Alert and cooperative, normal mood and affect.   Imaging Studies: No results found.  Assessment and Plan:   Abigail Rice is a 56 y.o. y/o female  here to follow-up for abnormal LFTs history of excess alcohol consumption.  Follows with Dr. Tasia Catchings in hematology.  Underwent a bone marrow biopsy. ultrasound shows features of possible cirrhosis.  She has had multiple labs which are  abnormal but which are not diagnostic of any condition. It is very possible that the elevated immunoglobulins, ferritin, CK may be due to the effects of alcohol. False positive Hep B eantigen.  Continues to drink alcohol advised to strongly stop     Plan 1. Elevated Alk phos secondary to Bone due to elevated fraction from bone.    2.  MRI MRCP in 09/11/2022 to evaluate for [facility] as well as for abnormally elevated alkaline phosphatase if negative will need liver biopsy   3. Strongly suggest her to avoid all alcohol consumption, advised to join alcoholic Anonymous   4.  EGD to screen for esophageal varices in 09/11/2024   5.  Not keen on hepatitis a and B shot today  6.  Advised to proceed with colonoscopy but platelet count is low she has not yet taken steroids prescribed by Dr. Tasia Catchings in hematology she informed me that once her platelet count is over 50,000 she will get in touch with me and we will proceed with colonoscopy        Dr Jonathon Bellows  MD,MRCP [facility]) Follow up in 8 weeks

## 2022-09-11 ENCOUNTER — Other Ambulatory Visit: Payer: Self-pay | Admitting: Physician Assistant

## 2022-09-12 ENCOUNTER — Encounter: Payer: BC Managed Care – PPO | Admitting: Physician Assistant

## 2022-09-13 ENCOUNTER — Other Ambulatory Visit: Payer: Self-pay | Admitting: Gastroenterology

## 2022-09-13 ENCOUNTER — Ambulatory Visit
Admission: RE | Admit: 2022-09-13 | Discharge: 2022-09-13 | Disposition: A | Discharge status: Home / Self Care | Payer: BC Managed Care – PPO | Source: Ambulatory Visit | Attending: Gastroenterology | Admitting: Gastroenterology

## 2022-09-13 DIAGNOSIS — K7030 Alcoholic cirrhosis of liver without ascites: Secondary | ICD-10-CM | POA: Diagnosis not present

## 2022-09-13 DIAGNOSIS — R748 Abnormal levels of other serum enzymes: Secondary | ICD-10-CM | POA: Diagnosis not present

## 2022-09-13 DIAGNOSIS — R188 Other ascites: Secondary | ICD-10-CM | POA: Diagnosis not present

## 2022-09-13 DIAGNOSIS — K8689 Other specified diseases of pancreas: Secondary | ICD-10-CM | POA: Diagnosis not present

## 2022-09-13 MED ORDER — GADOPICLENOL 0.5 MMOL/ML IV SOLN
7.5000 mL | Freq: Once | INTRAVENOUS | Status: AC | PRN
  Administered 2022-09-13: 7.5 mL via INTRAVENOUS

## 2022-09-16 ENCOUNTER — Telehealth: Payer: Self-pay | Admitting: Physician Assistant

## 2022-09-16 NOTE — Telephone Encounter (Signed)
Left vm to confirm 09/20/22 appointment-Toni

## 2022-09-18 ENCOUNTER — Telehealth: Payer: Self-pay

## 2022-09-18 ENCOUNTER — Other Ambulatory Visit: Payer: Self-pay

## 2022-09-18 NOTE — Telephone Encounter (Signed)
Pt came by office and was requesting more samples of the Humalog and that it cost her over 400.00.  I informed pt that we were out of samples and that she has and up coming appt and she cam discuss with provider if she can change it to something cheaper.  Pt agreed

## 2022-09-19 NOTE — Telephone Encounter (Signed)
error 

## 2022-09-20 ENCOUNTER — Other Ambulatory Visit: Payer: Self-pay | Admitting: Physician Assistant

## 2022-09-20 ENCOUNTER — Ambulatory Visit (INDEPENDENT_AMBULATORY_CARE_PROVIDER_SITE_OTHER): Payer: BC Managed Care – PPO | Admitting: Physician Assistant

## 2022-09-20 ENCOUNTER — Encounter: Payer: Self-pay | Admitting: Physician Assistant

## 2022-09-20 DIAGNOSIS — Z23 Encounter for immunization: Secondary | ICD-10-CM | POA: Diagnosis not present

## 2022-09-20 DIAGNOSIS — D696 Thrombocytopenia, unspecified: Secondary | ICD-10-CM

## 2022-09-20 DIAGNOSIS — Z0001 Encounter for general adult medical examination with abnormal findings: Secondary | ICD-10-CM

## 2022-09-20 DIAGNOSIS — K7030 Alcoholic cirrhosis of liver without ascites: Secondary | ICD-10-CM

## 2022-09-20 DIAGNOSIS — I10 Essential (primary) hypertension: Secondary | ICD-10-CM | POA: Diagnosis not present

## 2022-09-20 DIAGNOSIS — E039 Hypothyroidism, unspecified: Secondary | ICD-10-CM | POA: Diagnosis not present

## 2022-09-20 DIAGNOSIS — E1165 Type 2 diabetes mellitus with hyperglycemia: Secondary | ICD-10-CM

## 2022-09-20 DIAGNOSIS — Z794 Long term (current) use of insulin: Secondary | ICD-10-CM

## 2022-09-20 DIAGNOSIS — R7989 Other specified abnormal findings of blood chemistry: Secondary | ICD-10-CM

## 2022-09-20 DIAGNOSIS — R748 Abnormal levels of other serum enzymes: Secondary | ICD-10-CM

## 2022-09-20 DIAGNOSIS — R300 Dysuria: Secondary | ICD-10-CM | POA: Diagnosis not present

## 2022-09-20 LAB — POCT GLYCOSYLATED HEMOGLOBIN (HGB A1C): Hemoglobin A1C: 6 % — AB (ref 4.0–5.6)

## 2022-09-20 MED ORDER — INSULIN LISPRO (1 UNIT DIAL) 100 UNIT/ML (KWIKPEN): PEN_INJECTOR | SUBCUTANEOUS | 11 refills | Status: DC

## 2022-09-20 NOTE — Progress Notes (Signed)
[facility] [place], [place] [address]  Internal MEDICINE  Office Visit Note  Patient Name: Abigail Rice  [id number]  [id number]  Date of Service: 10/01/2022  Chief Complaint  Patient presents with   Annual Exam   Depression   Diabetes   Hypertension   Hyperlipidemia     HPI Pt is here for routine health maintenance examination -Generally 20 units BID of mix with sliding scale at lunch 4-10 usually. Prefers kwikpen, but humalog has been expensive. She states she also doesn't need very much since she only uses a few units per day and that it would be more affordable to only get 1 pen at a time. Will adjust script for this. BG has been doing very well. -Seeing GI and had an MRI of liver but hasnt had follow up yet. This was done due to elevated alk phos which per previous GI note does not seem to be liver related, but rather bone related. Did advise patient to supplement calcium and vitamin D and that an endocrinology referral may be needed to evaluate this further. She declines for now and will follow up with GI and supplement vitamins for now. Was supposed to have colonoscopy, but holding off due to low platelets -Hematology told her she needs steroids to boost platelets, but she never started on these and was confused as to whether she should. Advised that she likely does need these and to call hematology today to clarify. -Mammogram next Friday -defers foot exam due to stockings too hard to take off. And will plan for this next visit  Current Medication: Outpatient Encounter Medications as of 09/20/2022  Medication Sig   amLODipine (NORVASC) 5 MG tablet Take 1 tablet (5 mg total) by mouth daily.   Apple Cider Vinegar 188 MG CAPS Take 1 capsule by mouth daily.   cholecalciferol (VITAMIN D3) 25 MCG (1000 UNIT) tablet Take 1,000 Units by mouth daily.   Insulin Lispro Prot & Lispro (HUMALOG MIX 75/25 KWIKPEN) (75-25) 100 UNIT/ML Kwikpen INJECT 20 UNITS INTO  THE SKIN 2 (TWO) TIMES DAILY WITH A MEAL.   Insulin Pen Needle (BD PEN NEEDLE NANO 2ND GEN) 32G X 4 MM MISC USE AS DIRECTED 4 times a day with insulin pens   levETIRAcetam (KEPPRA) 500 MG tablet Take 1 tablet (500 mg total) by mouth 2 (two) times daily. (Patient taking differently: Take 250 mg by mouth daily. Taking 1/2 tab QD)   levothyroxine (SYNTHROID) 50 MCG tablet Take 1 tablet (50 mcg total) by mouth daily before breakfast.   losartan (COZAAR) 50 MG tablet Take 1 tablet (50 mg total) by mouth daily.   rOPINIRole (REQUIP) 0.25 MG tablet TAKE 1 TO 2 TABLETS BY MOUTH AT BEDTIME AS NEEDED   [DISCONTINUED] hyoscyamine (LEVSIN) 0.125 MG tablet TAKE 1 TABLET BY MOUTH EVERY 6 HOURS AS NEEDED FOR CRAMPING.   [DISCONTINUED] insulin lispro (HUMALOG) 100 UNIT/ML KwikPen Inject into the skin. Use at lunch with sliding scale may use up to 10 units [place]   insulin lispro (HUMALOG) 100 UNIT/ML KwikPen Inject into the skin. Use at lunch with sliding scale may use up to 10 units [place]   No facility-administered encounter medications on file as of 09/20/2022.    Surgical History: Past Surgical History:  Procedure Laterality Date   CESAREAN SECTION     COLONOSCOPY WITH PROPOFOL N/A 03/29/2016   Procedure: COLONOSCOPY WITH PROPOFOL;  Surgeon: Josefine Class, MD;  [facility] ENDOSCOPY;  Service: Endoscopy;  Laterality:  N/A;   ESOPHAGOGASTRODUODENOSCOPY (EGD) WITH PROPOFOL N/A 09/20/2021   Procedure: ESOPHAGOGASTRODUODENOSCOPY (EGD) WITH PROPOFOL;  Surgeon: Jonathon Bellows, MD;  [facility] ENDOSCOPY;  Service: Gastroenterology;  Laterality: N/A;   TUBAL LIGATION      Medical History: Past Medical History:  Diagnosis Date   Bipolar disorder ([place])    Depression    Diabetes mellitus without complication ([place])    History of seizure 2020   history of 1 seizure   Hyperlipidemia    Hypertension     Family History: Family History  Problem Relation Age of Onset   Diabetes Mother    Hypertension Mother     Breast cancer Sister       Review of Systems  Constitutional:  Negative for chills, fatigue and unexpected weight change.  HENT:  Negative for congestion, postnasal drip, rhinorrhea, sneezing and sore throat.   Eyes:  Negative for redness.  Respiratory:  Negative for cough, chest tightness and shortness of breath.   Cardiovascular:  Negative for chest pain and palpitations.  Gastrointestinal:  Negative for abdominal pain, constipation, diarrhea, nausea and vomiting.  Genitourinary:  Negative for dysuria and frequency.  Musculoskeletal:  Negative for arthralgias, back pain, joint swelling and neck pain.  Skin:  Negative for rash.  Neurological: Negative.  Negative for tremors and numbness.  Hematological:  Negative for adenopathy. Does not bruise/bleed easily.  Psychiatric/Behavioral:  Negative for behavioral problems (Depression), sleep disturbance and suicidal ideas. The patient is not nervous/anxious.      Vital Signs: BP 135/80   Pulse 89   Temp 98.4 F (36.9 C)   Resp 16   Ht _0  (1.651 m)   Wt 176 lb 3.2 oz (79.9 kg)   LMP 11/07/2015 (Approximate)   SpO2 100%   BMI 29.32 kg/m    Physical Exam Vitals and nursing note reviewed.  Constitutional:      General: She is not in acute distress.    Appearance: She is well-developed. She is obese. She is not diaphoretic.  HENT:     Head: Normocephalic and atraumatic.     Mouth/Throat:     Pharynx: No oropharyngeal exudate.  Eyes:     Pupils: Pupils are equal, round, and reactive to light.  Neck:     Thyroid: No thyromegaly.     Vascular: No JVD.     Trachea: No tracheal deviation.  Cardiovascular:     Rate and Rhythm: Normal rate and regular rhythm.     Heart sounds: Normal heart sounds. No murmur heard.    No friction rub. No gallop.  Pulmonary:     Effort: Pulmonary effort is normal. No respiratory distress.     Breath sounds: No wheezing or rales.  Chest:     Chest wall: No tenderness.  Abdominal:      General: Bowel sounds are normal.     Palpations: Abdomen is soft.     Tenderness: There is no abdominal tenderness.  Musculoskeletal:        General: Normal range of motion.     Cervical back: Normal range of motion and neck supple.  Lymphadenopathy:     Cervical: No cervical adenopathy.  Skin:    General: Skin is warm and dry.  Neurological:     Mental Status: She is alert and oriented to person, place, and time.     Cranial Nerves: No cranial nerve deficit.  Psychiatric:        Behavior: Behavior normal.  Thought Content: Thought content normal.        Judgment: Judgment normal.      LABS: Recent Results (from the past 2160 hour(s))  Comprehensive metabolic panel     Status: Abnormal   Collection Time: 08/01/22  2:09 PM  Result Value Ref Range   Sodium 134 (L) 135 - 145 mmol/L   Potassium 3.7 3.5 - 5.1 mmol/L   Chloride 105 98 - 111 mmol/L   CO2 24 22 - 32 mmol/L   Glucose, Bld 155 (H) 70 - 99 mg/dL    Comment: Glucose reference range applies only to samples taken after fasting for at least 8 hours.   BUN 14 6 - 20 mg/dL   Creatinine, Ser 1.23 (H) 0.44 - 1.00 mg/dL   Calcium 8.5 (L) 8.9 - 10.3 mg/dL   Total Protein 7.2 6.5 - 8.1 g/dL   Albumin 2.6 (L) 3.5 - 5.0 g/dL   AST 67 (H) 15 - 41 U/L   ALT 65 (H) 0 - 44 U/L   Alkaline Phosphatase 214 (H) 38 - 126 U/L   Total Bilirubin 0.5 0.3 - 1.2 mg/dL   GFR, Estimated 52 (L) >60 mL/min    Comment: (NOTE) Calculated using the CKD-EPI Creatinine Equation (2021)    Anion gap 5 5 - 15    Comment: Performed at [facility], [address] [place], [place] [address]  CBC     Status: Abnormal   Collection Time: 08/01/22  2:09 PM  Result Value Ref Range   WBC 5.9 4.0 - 10.5 K/uL   RBC 3.20 (L) 3.87 - 5.11 MIL/uL   Hemoglobin 10.4 (L) 12.0 - 15.0 g/dL   HCT 31.4 (L) 36.0 - 46.0 %   MCV 98.1 80.0 - 100.0 fL   MCH 32.5 26.0 - 34.0 pg   MCHC 33.1 30.0 - 36.0 g/dL   RDW 13.2 11.5 - 15.5 %   Platelets 42 (L) 150  - 400 K/uL    Comment: SPECIMEN CHECKED FOR CLOTS   nRBC 0.0 0.0 - 0.2 %    Comment: Performed at [facility], [address] [place], [place] [address]  POCT HgB A1C     Status: Abnormal   Collection Time: 09/20/22  9:06 AM  Result Value Ref Range   Hemoglobin A1C 6.0 (A) 4.0 - 5.6 %   HbA1c POC (<> result, manual entry)     HbA1c, POC (prediabetic range)     HbA1c, POC (controlled diabetic range)    UA/M w/rflx Culture, Routine     Status: Abnormal   Collection Time: 09/20/22 11:53 AM   Specimen: Urine   Urine  Result Value Ref Range   Specific Gravity, UA 1.011 1.005 - 1.030   pH, UA 6.0 5.0 - 7.5   Color, UA Yellow Yellow   Appearance Ur Turbid (A) Clear   Leukocytes,UA 3+ (A) Negative   Protein,UA 2+ (A) Negative/Trace   Glucose, UA Negative Negative   Ketones, UA Negative Negative   RBC, UA 1+ (A) Negative   Bilirubin, UA Negative Negative   Urobilinogen, Ur 0.2 0.2 - 1.0 mg/dL   Nitrite, UA Negative Negative   Microscopic Examination See below:     Comment: Microscopic was indicated and was performed.   Urinalysis Reflex Comment     Comment: This specimen has reflexed to a Urine Culture.  Urine Microalbumin w/creat. ratio     Status: Abnormal   Collection Time: 09/20/22 11:53 AM  Result Value Ref Range  Creatinine, Urine 89.8 Not Estab. mg/dL   Microalbumin, Urine 545.7 Not Estab. ug/mL    Comment: Results confirmed on dilution.    Microalb/Creat Ratio 608 (H) 0 - 29 mg/g creat    Comment:                        Normal:                0 -  29                        Moderately increased: 30 - 300                        Severely increased:       >300   Microscopic Examination     Status: Abnormal   Collection Time: 09/20/22 11:53 AM   Urine  Result Value Ref Range   WBC, UA >30 (A) 0 - 5 /hpf   RBC, Urine 0-2 0 - 2 /hpf   Epithelial Cells (non renal) 0-10 0 - 10 /hpf   Casts None seen None seen /lpf   Bacteria, UA Many (A) None seen/Few  Urine  Culture, Reflex     Status: Abnormal   Collection Time: 09/20/22 11:53 AM   Urine  Result Value Ref Range   Urine Culture, Routine Final report (A)    Organism ID, Bacteria Escherichia coli (A)     Comment: Multi-Drug Resistant Organism Susceptibility profile is consistent with a probable ESBL. Greater than 100,000 colony forming units per mL    ORGANISM ID, BACTERIA Not applicable    Antimicrobial Susceptibility Comment     Comment:       ** S = Susceptible; I = Intermediate; R = Resistant **                    P = Positive; N = Negative             MICS are expressed in micrograms per mL    Antibiotic                 RSLT#1    RSLT#2    RSLT#3    RSLT#4 Amoxicillin/Clavulanic Acid    S Ampicillin                     R Cefazolin                      R Cefepime                       R Ceftriaxone                    R Cefuroxime                     R Ciprofloxacin                  R Ertapenem                      S Gentamicin                     R Imipenem                       S Levofloxacin  R Meropenem                      S Nitrofurantoin                 S Piperacillin/Tazobactam        S Tetracycline                   S Tobramycin                     I Trimethoprim/Sulfa             S         Assessment/Plan: 1. Encounter for general adult medical examination with abnormal findings CPE performed, due for colonoscopy and is follpowing with GI, mammogram scheduled  2. Essential hypertension Stable, continue current medications  3. Type 2 diabetes mellitus with hyperglycemia, with long-term current use of insulin (HCC) - POCT HgB A1C is 6.0 which is improved from 6.3 last visit. May continue current medications and working on diet and exercise - Urine Microalbumin w/creat. ratio - insulin lispro (HUMALOG) 100 UNIT/ML KwikPen; Inject into the skin. Use at lunch with sliding scale may use up to 10 units [place]  Dispense: 3 mL; Refill: 11  4. Acquired  hypothyroidism Continue synthroid as before  5. Alcoholic cirrhosis, unspecified whether ascites present ([place]) Followed by GI  6. Thrombocytopenia ([place]) Followed by heme/onc--advised to contact office regarding prescribed steroids  7. Elevated alkaline phosphatase level Followed by GI, though concern for bone turnover rather than liver cause and advised patient further workup may be indicated with endocrinology. Pt declines further work up/referral at this time, but will start calcium and vit D supplements. Advised to contact office if she changes her mind.  8. Low serum calcium Start calcium and vit D supplements  9. Flu vaccine need - Flu Vaccine MDCK QUAD PF  10. Dysuria - UA/M w/rflx Culture, Routine   General Counseling: Ronnisha verbalizes understanding of the findings of todays visit and agrees with plan of treatment. I have discussed any further diagnostic evaluation that may be needed or ordered today. We also reviewed her medications today. she has been encouraged to call the office with any questions or concerns that should arise related to todays visit.    Counseling:    Orders Placed This Encounter  Procedures   Microscopic Examination   Urine Culture, Reflex   Flu Vaccine MDCK QUAD PF   UA/M w/rflx Culture, Routine   Urine Microalbumin w/creat. ratio   POCT HgB A1C    Meds ordered this encounter  Medications   insulin lispro (HUMALOG) 100 UNIT/ML KwikPen    Sig: Inject into the skin. Use at lunch with sliding scale may use up to 10 units [place]    Dispense:  3 mL    Refill:  11    Please fill generic    This patient was seen by Drema Dallas, PA-C in collaboration with Dr. Clayborn Bigness as a part of collaborative care agreement.  Total time spent:35 Minutes  Time spent includes review of chart, medications, test results, and follow up plan with the patient.     Lavera Guise, MD  Internal Medicine

## 2022-09-25 ENCOUNTER — Other Ambulatory Visit: Payer: Self-pay

## 2022-09-25 ENCOUNTER — Telehealth: Payer: Self-pay

## 2022-09-25 LAB — UA/M W/RFLX CULTURE, ROUTINE
Bilirubin, UA: NEGATIVE
Glucose, UA: NEGATIVE
Ketones, UA: NEGATIVE
Nitrite, UA: NEGATIVE
Specific Gravity, UA: 1.011 (ref 1.005–1.030)
Urobilinogen, Ur: 0.2 mg/dL (ref 0.2–1.0)
pH, UA: 6 (ref 5.0–7.5)

## 2022-09-25 LAB — MICROALBUMIN / CREATININE URINE RATIO
Creatinine, Urine: 89.8 mg/dL
Microalb/Creat Ratio: 608 mg/g creat — ABNORMAL HIGH (ref 0–29)
Microalbumin, Urine: 545.7 ug/mL

## 2022-09-25 LAB — URINE CULTURE, REFLEX

## 2022-09-25 LAB — MICROSCOPIC EXAMINATION
Casts: NONE SEEN /lpf
WBC, UA: >30 /hpf — AB (ref 0–5)

## 2022-09-25 MED ORDER — NITROFURANTOIN MONOHYD MACRO 100 MG PO CAPS: 100.0000 mg | ORAL_CAPSULE | Freq: Two times a day (BID) | ORAL | 0 refills | Status: DC

## 2022-09-25 NOTE — Progress Notes (Signed)
Lmom to call us back 

## 2022-09-25 NOTE — Telephone Encounter (Signed)
-----   Message from Mylinda Latina, PA-C sent at 09/25/2022  9:21 AM EDT ----- Please let her know that she has a UTI and send macrobid BID x10 days. Please also let her know that she had a lot of protein in her urine. This can sometimes be seen with a UTI, but her levels might suggest this is due to changes in her kidneys and we will need to recheck this and consider nephrology referral.

## 2022-09-25 NOTE — Telephone Encounter (Signed)
Pt notified for result and send Macrobid and also send desha message to help her with pt assistance program for Humalog

## 2022-09-26 ENCOUNTER — Telehealth: Payer: Self-pay

## 2022-09-26 NOTE — Telephone Encounter (Signed)
-----   Message from Corlis Hove sent at 09/25/2022  2:20 PM EDT ----- Pt  humalog  is expensive please can you send pt assistance program

## 2022-09-26 NOTE — Telephone Encounter (Signed)
LMOM that she can pick up the pt asst application I placed up front and gave instructions on completing application and once finished bring back to office and we will complete and fax to company

## 2022-09-27 DIAGNOSIS — Z1231 Encounter for screening mammogram for malignant neoplasm of breast: Secondary | ICD-10-CM | POA: Diagnosis not present

## 2022-10-17 IMAGING — CT CT BIOPSY AND ASPIRATION BONE MARROW
2 of 4 series · 8 of 14 positions shown, 9 images · non-contrast
Comparison: none

INDICATION: Thrombocytopenia of uncertain etiology. Please perform CT-guided
bone marrow biopsy for tissue diagnostic purposes.

[Series 2: i-spiral 5.0 b30f · axial · 0.54mm/px · z∈[+876,+928]mm · 4 of 26 slices shown, 5 images (1 of 2)]
[im 6/26  soft-tissue]
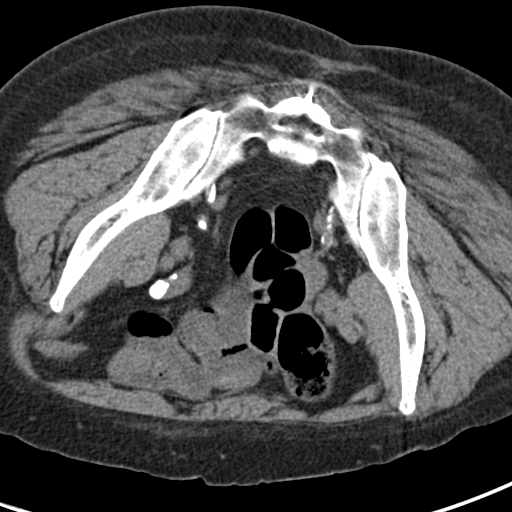
[im 6/26  bone]
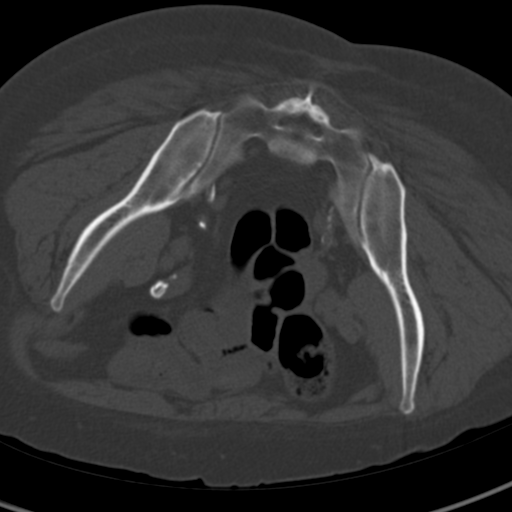
[im 11/26  bone]
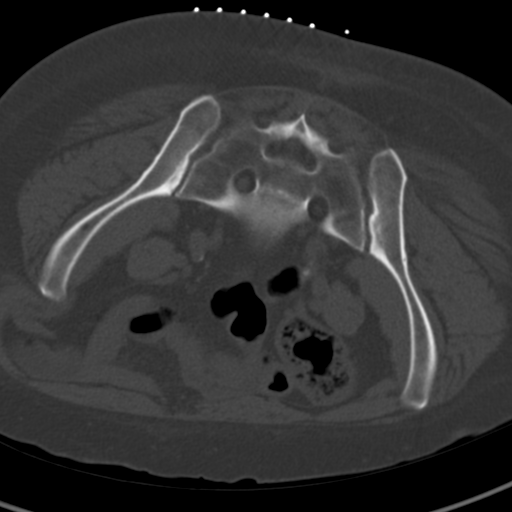
[im 16/26  bone]
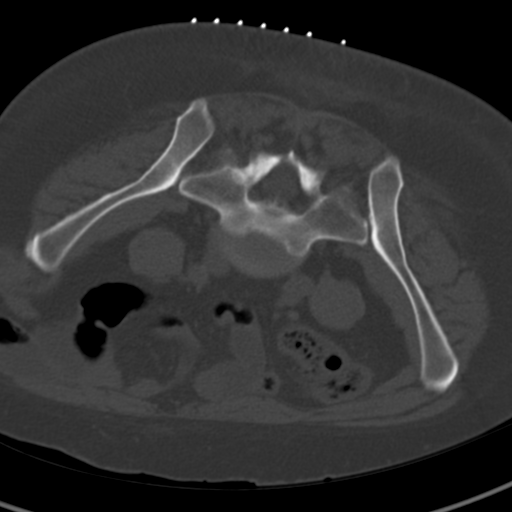
[im 21/26  bone]
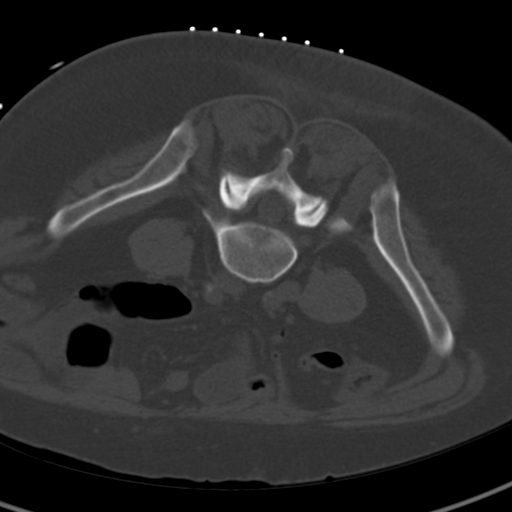

[Series 3: i-spiral 5.0 b30f · axial · 0.54mm/px · z∈[+876,+928]mm · 4 of 26 slices shown (2 of 2)]
[im 6/26  bone]
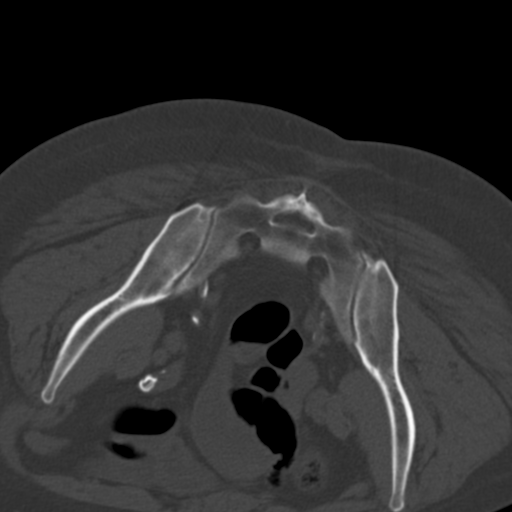
[im 11/26  bone]
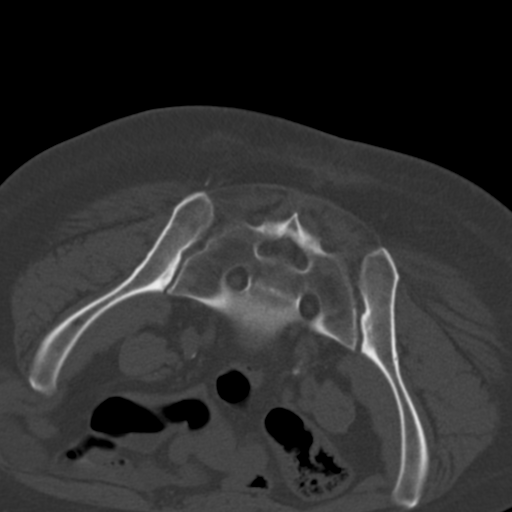
[im 16/26  bone]
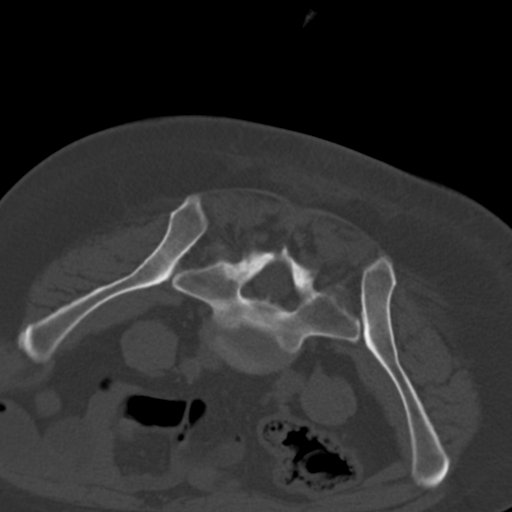
[im 21/26  bone]
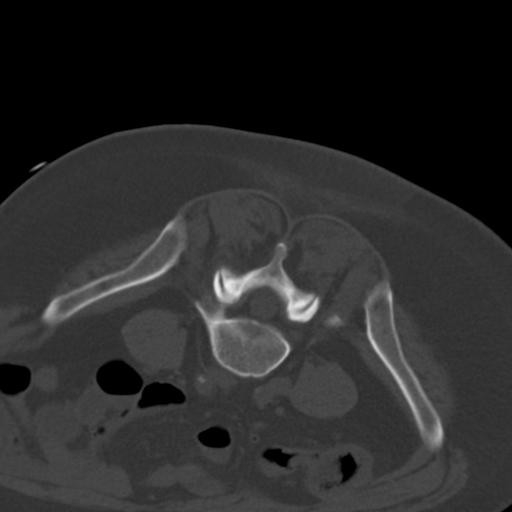

[8 of 14 positions shown; findings below may reference images not displayed]

EXAM:
CT-GUIDED BONE MARROW BIOPSY AND ASPIRATION

MEDICATIONS:
None

ANESTHESIA/SEDATION:
Fentanyl 100 mcg IV; Versed 2 mg IV

Sedation Time: 14 Minutes; The patient was continuously monitored
during the procedure by the interventional radiology nurse under my
direct supervision.

COMPLICATIONS:
None immediate.

PROCEDURE:
Informed consent was obtained from the patient following an
explanation of the procedure, risks, benefits and alternatives. The
patient understands, agrees and consents for the procedure. All
questions were addressed. A time out was performed prior to the
initiation of the procedure.

The patient was positioned prone and non-contrast localization CT
was performed of the pelvis to demonstrate the iliac marrow spaces.
The operative site was prepped and draped in the usual sterile
fashion.

Under sterile conditions and local anesthesia, a 22 gauge spinal
needle was utilized for procedural planning. Next, an 11 gauge
coaxial bone biopsy needle was advanced into the left iliac marrow
space. Needle position was confirmed with CT imaging. Initially, a
bone marrow aspiration was performed. Next, a bone marrow biopsy was
obtained with the 11 gauge outer bone marrow device. The 11 gauge
coaxial bone biopsy needle was re-advanced into a slightly different
location within the left iliac marrow space, positioning was
confirmed with CT imaging and an additional bone marrow biopsy was
obtained. The needle was removed and superficial hemostasis was
obtained with manual compression. A dressing was applied. The
patient tolerated the procedure well without immediate post
procedural complication.
IMPRESSION: Successful CT guided left iliac bone marrow aspiration and core
biopsy.

## 2022-10-20 ENCOUNTER — Other Ambulatory Visit: Payer: Self-pay | Admitting: Physician Assistant

## 2022-10-20 DIAGNOSIS — I10 Essential (primary) hypertension: Secondary | ICD-10-CM

## 2022-10-29 ENCOUNTER — Telehealth: Payer: Self-pay | Admitting: Oncology

## 2022-10-29 NOTE — Telephone Encounter (Signed)
pt called wanting to know about coming in for MD appt, Says she finished her steroids, and need plateletts checked.

## 2022-10-30 NOTE — Telephone Encounter (Signed)
Per Dr. Tasia Catchings, no need to recheck labs unless she is having an upcoming procedure, if so, she will need labs 1 week (cbc) prior to procedure/ surgery. Pt states she is having a procedure sometime next month but is not sure of date yet, she will call back to let us know.

## 2022-11-01 ENCOUNTER — Other Ambulatory Visit: Payer: Self-pay | Admitting: Physician Assistant

## 2022-11-01 DIAGNOSIS — G2581 Restless legs syndrome: Secondary | ICD-10-CM

## 2022-11-19 ENCOUNTER — Other Ambulatory Visit: Payer: Self-pay

## 2022-11-19 DIAGNOSIS — R569 Unspecified convulsions: Secondary | ICD-10-CM

## 2022-11-19 MED ORDER — LEVETIRACETAM 500 MG PO TABS: 500.0000 mg | ORAL_TABLET | Freq: Two times a day (BID) | ORAL | 0 refills | Status: DC

## 2022-11-19 NOTE — Telephone Encounter (Signed)
As per lauren send keppra as  it and advised pt to discus with lauren at next visit

## 2022-12-04 ENCOUNTER — Other Ambulatory Visit: Payer: Self-pay | Admitting: Physician Assistant

## 2022-12-04 DIAGNOSIS — R569 Unspecified convulsions: Secondary | ICD-10-CM

## 2022-12-09 ENCOUNTER — Other Ambulatory Visit: Payer: Self-pay

## 2022-12-12 ENCOUNTER — Ambulatory Visit: Payer: BC Managed Care – PPO | Admitting: Gastroenterology

## 2022-12-12 ENCOUNTER — Telehealth: Payer: Self-pay

## 2022-12-12 DIAGNOSIS — R7989 Other specified abnormal findings of blood chemistry: Secondary | ICD-10-CM

## 2022-12-12 NOTE — Telephone Encounter (Signed)
Called patient to let her know that I needed to cancel her appointment with Dr. Vicente Males today since she had no taken the steroids that Dr. Tasia Catchings had prescribed her so her platelets could go up. Patient then stated that she had taken the steroids and that she called Dr. Collie Siad office to ask if she could have her labs drawn to check on her levels and she was told that she did not needed to have blood drawn unless she was having surgery. So I then communicated this to Dr. Vicente Males and he stated that she should check her cbc in 4 weeks and if her platelets were higher than 50 thousand, then she needed to schedule a colonoscopy. I then called patient back and informed her what Dr. Vicente Males recommended and she agreed. Patient stated that in 4 weeks, she would go and have her labs drawn.

## 2022-12-12 NOTE — Progress Notes (Deleted)
Jonathon Bellows MD, MRCP(U.K) [address]  [place]  [place], [place] [address]  Main: [phone]  Fax: [phone]   Primary Care Physician: Mylinda Latina, PA-C  Primary Gastroenterologist:  Dr. Jonathon Bellows   No chief complaint on file.   HPI: Abigail Rice is a 56 y.o. female Summary of history :   Initially referred and seen on 07/25/2021 for elevated LFTs.  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 History of heavy alcohol consumption 3-4 beers per day.  Differentials being considered are ITP and chronic alcohol consumption/liver disease.  Undergone a bone marrow biopsy in MarchDenies any illegal drug use    06/06/2021: Hemoglobin 11.2 g with a platelet count of 40 MCV of 99.7, albumin 3.3 alkaline phosphatase of 176, AST of 67, ALT of 60 hemochromatosis genetic testing showed no mutation.  HIV testing was negative acute hepatitis panel showed no active hepatitis B surface antigen or hepatitis C virus antibody.  B12 levels was normal in January 2022 iron studies showed increased iron percentage saturation of 76% and a ferritin of 595 08/10/2021: Ultrasound right upper quadrant showed lobular hepatic contour with nodularity suspicious for early cirrhosis.    07/25/2021: Elevated CK2 96, hepatitis B E antigen positive not immune to hepatitis A ceruloplasmin low otherwise autoimmune screen, celiac serology was negative.  Elevated IgE    08/15/2021 TSH normal.  CMP shows elevated alkaline phosphatase 307 along with transaminases hemoglobin 11.3 g hepatitis delta PCR not detected, hepatitis B DNA not detected CK elevated at 24-hour urinary copper excretion within normal limits   09/20/2021: EGD: Schatzki's ring found in the lower third of the esophagus nonobstructing.  No esophageal varices seen   10/31/2021: Ceruloplasmin low 16.7 , IGE elevated 1766, Hep B e antigen negative May 2023 eye exam showed no evidence of [employer] rings   01/31/2022:  Hemochromatosis gene not detected,, GT elevated, alkaline phosphatase 242 ALT 61 AST 70 creatinine 1.25 total bilirubin 0.7, hemoglobin 10.6 HbA1c 6.4 hepatitis B surface antibody not reactive 03/06/2022: RUQ VEL:[id number]    Interval history  09/05/2022-12/12/2022   She called in with rectal bleeding a few weeks back discussed with Dr. Marius Ditch here for follow-up.  No rectal bleeding presently.  No other complaints presently still drinking beer. Was told was commenced on steroids by Dr. Tasia Catchings for her thrombocytopenia has not commenced on the same 06/13/2022: fractionated alk phos : elevated bone fraction    Current Outpatient Medications  Medication Sig Dispense Refill   amLODipine (NORVASC) 5 MG tablet TAKE 1 TABLET (5 MG TOTAL) BY MOUTH DAILY. 90 tablet 1   Apple Cider Vinegar 188 MG CAPS Take 1 capsule by mouth daily.     cholecalciferol (VITAMIN D3) 25 MCG (1000 UNIT) tablet Take 1,000 Units by mouth daily.     insulin lispro (HUMALOG) 100 UNIT/ML KwikPen Inject into the skin. Use at lunch with sliding scale may use up to 10 units [place] 3 mL 11   Insulin Lispro Prot & Lispro (HUMALOG MIX 75/25 KWIKPEN) (75-25) 100 UNIT/ML Kwikpen INJECT 20 UNITS INTO THE SKIN 2 (TWO) TIMES DAILY WITH A MEAL. 15 mL 11   Insulin Pen Needle (BD PEN NEEDLE NANO 2ND GEN) 32G X 4 MM MISC USE AS DIRECTED 4 times a day with insulin pens 400 each 3   levETIRAcetam (KEPPRA) 500 MG tablet TAKE 1 TABLET BY MOUTH TWICE A DAY 60 tablet 0   levothyroxine (SYNTHROID) 50 MCG tablet Take 1 tablet (50 mcg  total) by mouth daily before breakfast. 90 tablet 1   losartan (COZAAR) 50 MG tablet Take 1 tablet (50 mg total) by mouth daily. 90 tablet 3   rOPINIRole (REQUIP) 0.25 MG tablet TAKE 1-2 TABLETS BY MOUTH AT BEDTIME AS NEEDED 180 tablet 1   No current facility-administered medications for this visit.    Allergies as of 12/12/2022 - Review Complete 09/20/2022  Allergen Reaction Noted   Ace inhibitors  03/28/2016   Ciprofloxacin   08/16/2019   Lisinopril  01/23/2018   Risperdal [risperidone]  01/17/2021   Vytorin [ezetimibe-simvastatin]  03/28/2016    ROS:  General: Negative for anorexia, weight loss, fever, chills, fatigue, weakness. ENT: Negative for hoarseness, difficulty swallowing , nasal congestion. CV: Negative for chest pain, angina, palpitations, dyspnea on exertion, peripheral edema.  Respiratory: Negative for dyspnea at rest, dyspnea on exertion, cough, sputum, wheezing.  GI: See history of present illness. GU:  Negative for dysuria, hematuria, urinary incontinence, urinary frequency, nocturnal urination.  Endo: Negative for unusual weight change.    Physical Examination:   LMP 11/07/2015 (Approximate)   General: Well-nourished, well-developed in no acute distress.  Eyes: No icterus. Conjunctivae pink. Mouth: Oropharyngeal mucosa moist and pink , no lesions erythema or exudate. Lungs: Clear to auscultation bilaterally. Non-labored. Heart: Regular rate and rhythm, no murmurs rubs or gallops.  Abdomen: Bowel sounds are normal, nontender, nondistended, no hepatosplenomegaly or masses, no abdominal bruits or hernia , no rebound or guarding.   Extremities: No lower extremity edema. No clubbing or deformities. Neuro: Alert and oriented x 3.  Grossly intact. Skin: Warm and dry, no jaundice.   Psych: Alert and cooperative, normal mood and affect.   Imaging Studies: No results found.  Assessment and Plan:   Abigail Rice is a 56 y.o. y/o female  here to follow-up for abnormal LFTs history of excess alcohol consumption.  Follows with Dr. Tasia Catchings in hematology.  Underwent a bone marrow biopsy. ultrasound shows features of possible cirrhosis.  She has had multiple labs which are abnormal but which are not diagnostic of any condition. It is very possible that the elevated immunoglobulins, ferritin, CK may be due to the effects of alcohol. False positive Hep B eantigen.  Continues to drink alcohol advised to  strongly stop     Plan 1. Elevated Alk phos secondary to Bone due to elevated fraction from bone.    2.  MRI MRCP in 09/11/2022 to evaluate for [facility] as well as for abnormally elevated alkaline phosphatase if negative will need liver biopsy   3. Strongly suggest her to avoid all alcohol consumption, advised to join alcoholic Anonymous   4.  EGD to screen for esophageal varices in 09/11/2024   5.  Not keen on hepatitis a and B shot today   6.  Advised to proceed with colonoscopy but platelet count is low she has not yet taken steroids prescribed by Dr. Tasia Catchings in hematology she informed me that once her platelet count is over 50,000 she will get in touch with me and we will proceed with colonoscopy      Dr Jonathon Bellows  MD,MRCP [facility]) Follow up in ***

## 2022-12-20 ENCOUNTER — Ambulatory Visit (INDEPENDENT_AMBULATORY_CARE_PROVIDER_SITE_OTHER): Payer: BC Managed Care – PPO | Admitting: Physician Assistant

## 2022-12-20 ENCOUNTER — Encounter: Payer: Self-pay | Admitting: Physician Assistant

## 2022-12-20 VITALS — BP 117/74 | HR 83 | Temp 98.1°F | Resp 16 | Ht 65.0 in | Wt 171.0 lb

## 2022-12-20 DIAGNOSIS — I10 Essential (primary) hypertension: Secondary | ICD-10-CM | POA: Diagnosis not present

## 2022-12-20 DIAGNOSIS — D696 Thrombocytopenia, unspecified: Secondary | ICD-10-CM

## 2022-12-20 DIAGNOSIS — E1165 Type 2 diabetes mellitus with hyperglycemia: Secondary | ICD-10-CM | POA: Diagnosis not present

## 2022-12-20 DIAGNOSIS — Z794 Long term (current) use of insulin: Secondary | ICD-10-CM

## 2022-12-20 LAB — POCT GLYCOSYLATED HEMOGLOBIN (HGB A1C): Hemoglobin A1C: 5.5 % (ref 4.0–5.6)

## 2022-12-20 NOTE — Progress Notes (Signed)
[facility], [place] [address]  Internal MEDICINE  Office Visit Note  Patient Name: Abigail Rice  [id number]  [id number]  Date of Service: 12/31/2022  Chief Complaint  Patient presents with   Follow-up   Depression   Diabetes   Hypertension   Hyperlipidemia    HPI Pt is here for routine follow up -Left big toe had scab under it that came off in office but new skin well healed under. Discussed well fitting shoes and checking feet directly. -Had taken steroids with hematology to boost platelets prior to ABX for UTI after last visit. By 8th day on ABX had rash and swelling in both legs. This is improved now, but still some dry skin. Will add ABX as allergy due to rash -Dr. Vicente Males wouldn't do colonoscopy while platelets low so needed these to be rechecked after steroids. So he has ordered CBC to have labs rechecked and will have visit after this to move forward with colonoscopy. -Also supposed to have teeth pulled and dentures placed with gums bleeding, but can't do that while platelets low. -Pt is stressed and overwhelmed with health concerns and multiple specialist visits, but is pleased with improvement in A1c to normal and may be able to scale back insulin if sugars well maintained  Current Medication: Outpatient Encounter Medications as of 12/20/2022  Medication Sig   amLODipine (NORVASC) 5 MG tablet TAKE 1 TABLET (5 MG TOTAL) BY MOUTH DAILY.   Apple Cider Vinegar 188 MG CAPS Take 1 capsule by mouth daily.   cholecalciferol (VITAMIN D3) 25 MCG (1000 UNIT) tablet Take 1,000 Units by mouth daily.   insulin lispro (HUMALOG) 100 UNIT/ML KwikPen Inject into the skin. Use at lunch with sliding scale may use up to 10 units [place]   Insulin Lispro Prot & Lispro (HUMALOG MIX 75/25 KWIKPEN) (75-25) 100 UNIT/ML Kwikpen INJECT 20 UNITS INTO THE SKIN 2 (TWO) TIMES DAILY WITH A MEAL.   Insulin Pen Needle (BD PEN NEEDLE NANO 2ND GEN) 32G X 4 MM MISC USE AS DIRECTED 4  times a day with insulin pens   levETIRAcetam (KEPPRA) 500 MG tablet TAKE 1 TABLET BY MOUTH TWICE A DAY   levothyroxine (SYNTHROID) 50 MCG tablet Take 1 tablet (50 mcg total) by mouth daily before breakfast.   losartan (COZAAR) 50 MG tablet Take 1 tablet (50 mg total) by mouth daily.   rOPINIRole (REQUIP) 0.25 MG tablet TAKE 1-2 TABLETS BY MOUTH AT BEDTIME AS NEEDED   No facility-administered encounter medications on file as of 12/20/2022.    Surgical History: Past Surgical History:  Procedure Laterality Date   CESAREAN SECTION     COLONOSCOPY WITH PROPOFOL N/A 03/29/2016   Procedure: COLONOSCOPY WITH PROPOFOL;  Surgeon: Josefine Class, MD;  Location: [facility] ENDOSCOPY;  Service: Endoscopy;  Laterality: N/A;   ESOPHAGOGASTRODUODENOSCOPY (EGD) WITH PROPOFOL N/A 09/20/2021   Procedure: ESOPHAGOGASTRODUODENOSCOPY (EGD) WITH PROPOFOL;  Surgeon: Jonathon Bellows, MD;  Location: [facility] ENDOSCOPY;  Service: Gastroenterology;  Laterality: N/A;   TUBAL LIGATION      Medical History: Past Medical History:  Diagnosis Date   Bipolar disorder ([place])    Depression    Diabetes mellitus without complication ([place])    History of seizure 2020   history of 1 seizure   Hyperlipidemia    Hypertension     Family History: Family History  Problem Relation Age of Onset   Diabetes Mother    Hypertension Mother    Breast cancer Sister  Social History   Socioeconomic History   Marital status: Married    Spouse name: Not on file   Number of children: Not on file   Years of education: Not on file   Highest education level: Not on file  Occupational History   Not on file  Tobacco Use   Smoking status: Every Day    Packs/day: 0.50    Years: 25.00    Total pack years: 12.50    Types: Cigarettes   Smokeless tobacco: Never   Tobacco comments:    6 -7  cigarettes   Vaping Use   Vaping Use: Never used  Substance and Sexual Activity   Alcohol use: Yes    Alcohol/week: 2.0 standard drinks of alcohol     Types: 2 Cans of beer per week    Comment: daily    Drug use: No   Sexual activity: Not on file  Other Topics Concern   Not on file  Social History Narrative   Not on file   Social Determinants of Health   Financial Resource Strain: Not on file  Food Insecurity: Not on file  Transportation Needs: Not on file  Physical Activity: Not on file  Stress: Not on file  Social Connections: Not on file  Intimate Partner Violence: Not on file      Review of Systems  Constitutional:  Negative for chills, fatigue and unexpected weight change.  HENT:  Negative for congestion, postnasal drip, rhinorrhea, sneezing and sore throat.   Eyes:  Negative for redness.  Respiratory:  Negative for cough, chest tightness and shortness of breath.   Cardiovascular:  Negative for chest pain and palpitations.  Gastrointestinal:  Negative for abdominal pain, constipation, diarrhea, nausea and vomiting.  Genitourinary:  Negative for dysuria and frequency.  Musculoskeletal:  Negative for arthralgias, back pain, joint swelling and neck pain.  Skin:  Positive for wound. Negative for rash.       Scab under left big toe  Neurological: Negative.  Negative for tremors and numbness.  Hematological:  Negative for adenopathy. Does not bruise/bleed easily.  Psychiatric/Behavioral:  Negative for behavioral problems (Depression), sleep disturbance and suicidal ideas. The patient is not nervous/anxious.     Vital Signs: BP 117/74   Pulse 83   Temp 98.1 F (36.7 C)   Resp 16   Ht '5\' 5"'$  (1.651 m)   Wt 171 lb (77.6 kg)   LMP 11/07/2015 (Approximate)   SpO2 99%   BMI 28.46 kg/m    Physical Exam Vitals and nursing note reviewed.  Constitutional:      General: She is not in acute distress.    Appearance: She is well-developed. She is obese. She is not diaphoretic.  HENT:     Head: Normocephalic and atraumatic.     Mouth/Throat:     Pharynx: No oropharyngeal exudate.  Eyes:     Pupils: Pupils are equal,  round, and reactive to light.  Neck:     Thyroid: No thyromegaly.     Vascular: No JVD.     Trachea: No tracheal deviation.  Cardiovascular:     Rate and Rhythm: Normal rate and regular rhythm.     Heart sounds: Normal heart sounds. No murmur heard.    No friction rub. No gallop.  Pulmonary:     Effort: Pulmonary effort is normal. No respiratory distress.     Breath sounds: No wheezing or rales.  Chest:     Chest wall: No tenderness.  Abdominal:  General: Bowel sounds are normal.     Palpations: Abdomen is soft.  Musculoskeletal:        General: Normal range of motion.     Cervical back: Normal range of motion and neck supple.  Lymphadenopathy:     Cervical: No cervical adenopathy.  Skin:    General: Skin is warm and dry.     Comments: Scab fell off underside of left big toe likely from previous ulcer/blister, but no open wound and skin well healed without sign of infection  Neurological:     Mental Status: She is alert and oriented to person, place, and time.     Cranial Nerves: No cranial nerve deficit.  Psychiatric:        Behavior: Behavior normal.        Thought Content: Thought content normal.        Judgment: Judgment normal.        Assessment/Plan: 1. Type 2 diabetes mellitus with hyperglycemia, with long-term current use of insulin (HCC) - POCT glycosylated hemoglobin (Hb A1C) is 5.5 which is improved from 6.0 last visit and in normal range, may be able to adjust insulin as sugars controlled  2. Essential hypertension Stable, continue current medications  3. Thrombocytopenia ([place]) Followed by heme/onc with updated labs ordered by GI prior to scheduling colonoscopy   General Counseling: Abigail Rice verbalizes understanding of the findings of todays visit and agrees with plan of treatment. I have discussed any further diagnostic evaluation that may be needed or ordered today. We also reviewed her medications today. she has been encouraged to call the office with  any questions or concerns that should arise related to todays visit.    Orders Placed This Encounter  Procedures   POCT glycosylated hemoglobin (Hb A1C)    No orders of the defined types were placed in this encounter.   This patient was seen by Drema Dallas, PA-C in collaboration with Dr. Clayborn Bigness as a part of collaborative care agreement.   Total time spent:30 Minutes Time spent includes review of chart, medications, test results, and follow up plan with the patient.      Dr Lavera Guise Internal medicine

## 2023-01-02 ENCOUNTER — Other Ambulatory Visit: Payer: Self-pay | Admitting: Physician Assistant

## 2023-01-02 DIAGNOSIS — R569 Unspecified convulsions: Secondary | ICD-10-CM

## 2023-01-15 ENCOUNTER — Telehealth: Payer: Self-pay

## 2023-01-15 NOTE — Telephone Encounter (Signed)
Patient called stating that she was going today to [facility] to have her lab drawn today. She wanted to make sure that Dr. Vicente Males calls her back with results so that way she is able to go to her dentist if her platelets were back to normal and to please notify Dr. Tasia Catchings about the results. I told her that I will follow up on it. Patient had no further questions.

## 2023-01-21 NOTE — Telephone Encounter (Signed)
Called patient to ask if she had gone to have her labs drawn but she did not answer and I was not able to leave her a message.

## 2023-01-31 ENCOUNTER — Inpatient Hospital Stay: Payer: Commercial Managed Care - PPO | Attending: Oncology

## 2023-01-31 ENCOUNTER — Inpatient Hospital Stay (HOSPITAL_BASED_OUTPATIENT_CLINIC_OR_DEPARTMENT_OTHER): Payer: Commercial Managed Care - PPO | Admitting: Oncology

## 2023-01-31 ENCOUNTER — Encounter: Payer: Self-pay | Admitting: Oncology

## 2023-01-31 VITALS — BP 125/76 | HR 75 | Temp 97.4°F | Resp 18 | Wt 169.4 lb

## 2023-01-31 DIAGNOSIS — K7030 Alcoholic cirrhosis of liver without ascites: Secondary | ICD-10-CM

## 2023-01-31 DIAGNOSIS — M7989 Other specified soft tissue disorders: Secondary | ICD-10-CM

## 2023-01-31 DIAGNOSIS — Z79899 Other long term (current) drug therapy: Secondary | ICD-10-CM | POA: Insufficient documentation

## 2023-01-31 DIAGNOSIS — R748 Abnormal levels of other serum enzymes: Secondary | ICD-10-CM

## 2023-01-31 DIAGNOSIS — F17210 Nicotine dependence, cigarettes, uncomplicated: Secondary | ICD-10-CM | POA: Diagnosis not present

## 2023-01-31 DIAGNOSIS — D649 Anemia, unspecified: Secondary | ICD-10-CM

## 2023-01-31 DIAGNOSIS — D696 Thrombocytopenia, unspecified: Secondary | ICD-10-CM | POA: Diagnosis present

## 2023-01-31 DIAGNOSIS — E119 Type 2 diabetes mellitus without complications: Secondary | ICD-10-CM | POA: Diagnosis not present

## 2023-01-31 DIAGNOSIS — I10 Essential (primary) hypertension: Secondary | ICD-10-CM | POA: Diagnosis not present

## 2023-01-31 DIAGNOSIS — Z789 Other specified health status: Secondary | ICD-10-CM

## 2023-01-31 DIAGNOSIS — Z803 Family history of malignant neoplasm of breast: Secondary | ICD-10-CM | POA: Insufficient documentation

## 2023-01-31 LAB — CBC WITH DIFFERENTIAL/PLATELET
Abs Immature Granulocytes: 0.01 10*3/uL (ref 0.00–0.07)
Basophils Absolute: 0.1 10*3/uL (ref 0.0–0.1)
Basophils Relative: 1 %
Eosinophils Absolute: 0.1 10*3/uL (ref 0.0–0.5)
Eosinophils Relative: 2 %
HCT: 31.4 % — ABNORMAL LOW (ref 36.0–46.0)
Hemoglobin: 10.2 g/dL — ABNORMAL LOW (ref 12.0–15.0)
Immature Granulocytes: 0 %
Lymphocytes Relative: 60 %
Lymphs Abs: 3.4 10*3/uL (ref 0.7–4.0)
MCH: 31.2 pg (ref 26.0–34.0)
MCHC: 32.5 g/dL (ref 30.0–36.0)
MCV: 96 fL (ref 80.0–100.0)
Monocytes Absolute: 0.4 10*3/uL (ref 0.1–1.0)
Monocytes Relative: 7 %
Neutro Abs: 1.7 10*3/uL (ref 1.7–7.7)
Neutrophils Relative %: 30 %
Platelets: 70 10*3/uL — ABNORMAL LOW (ref 150–400)
RBC: 3.27 MIL/uL — ABNORMAL LOW (ref 3.87–5.11)
RDW: 13.7 % (ref 11.5–15.5)
WBC: 5.7 10*3/uL (ref 4.0–10.5)
nRBC: 0 % (ref 0.0–0.2)

## 2023-01-31 LAB — COMPREHENSIVE METABOLIC PANEL
ALT: 41 U/L (ref 0–44)
AST: 56 U/L — ABNORMAL HIGH (ref 15–41)
Albumin: 2.8 g/dL — ABNORMAL LOW (ref 3.5–5.0)
Alkaline Phosphatase: 142 U/L — ABNORMAL HIGH (ref 38–126)
Anion gap: 9 (ref 5–15)
BUN: 10 mg/dL (ref 6–20)
CO2: 21 mmol/L — ABNORMAL LOW (ref 22–32)
Calcium: 8.2 mg/dL — ABNORMAL LOW (ref 8.9–10.3)
Chloride: 100 mmol/L (ref 98–111)
Creatinine, Ser: 0.94 mg/dL (ref 0.44–1.00)
GFR, Estimated: >60 mL/min (ref >60)
Glucose, Bld: 147 mg/dL — ABNORMAL HIGH (ref 70–99)
Potassium: 3.6 mmol/L (ref 3.5–5.1)
Sodium: 130 mmol/L — ABNORMAL LOW (ref 135–145)
Total Bilirubin: 0.7 mg/dL (ref 0.3–1.2)
Total Protein: 7.8 g/dL (ref 6.5–8.1)

## 2023-01-31 NOTE — Assessment & Plan Note (Signed)
Obtain ultrasound left lower extremity to rule out DVT.  If negative, most likely this is secondary to vein insufficiency.

## 2023-01-31 NOTE — Progress Notes (Signed)
Pt here for follow up. Pt reports that she will have all teeth extracted this month so she can get dentures.

## 2023-01-31 NOTE — Assessment & Plan Note (Signed)
Alkaline phosphatase fractionated showed increase of bone fraction.  Recommend skeletal survey.  She declined.

## 2023-01-31 NOTE — Assessment & Plan Note (Signed)
Hemoglobin stable.  Continue observation.

## 2023-01-31 NOTE — Assessment & Plan Note (Addendum)
#  Thrombocytopenia possible due to peripheral destruction-possible ITP, chronic liver disease/alcohol consumption. Status post dexamethasone 40 mg daily for 4 days after last visit. Labs reviewed and discussed with patient. Platelet count has improved to 70,000.-Okay to proceed with dental extraction. Patient has upcoming tooth extraction procedure at the end of February. Recommend patient to repeat a CBC/coags 1 week prior to her procedure and she declined.

## 2023-01-31 NOTE — Progress Notes (Signed)
Hematology/Oncology Progress note [phone] [phone]    CHIEF COMPLAINTS/REASON FOR VISIT:  Thrombocytopenia  ASSESSMENT & PLAN:   Thrombocytopenia ([place]) #Thrombocytopenia possible due to peripheral destruction-possible ITP, chronic liver disease/alcohol consumption. Status post dexamethasone 40 mg daily for 4 days after last visit. Labs reviewed and discussed with patient. Platelet count has improved to 70,000.-Okay to proceed with dental extraction. Patient has upcoming tooth extraction procedure at the end of February. Recommend patient to repeat a CBC/coags 1 week prior to her procedure and she declined.  Alcoholic cirrhosis ([place]) Recommend patient to follow-up with gastroenterology Dr. Vicente Males.   It was recommended to repeat endoscopy after repeating blood work. Patient prefers to defer for now due to her dental issue.  She plans to call GI office later.  Normocytic anemia Hemoglobin stable.  Continue observation.  Elevated alkaline phosphatase level Alkaline phosphatase fractionated showed increase of bone fraction.  Recommend skeletal survey.  She declined.  Left leg swelling Obtain ultrasound left lower extremity to rule out DVT.  If negative, most likely this is secondary to vein insufficiency.  Orders Placed This Encounter  Procedures   US Venous Img Lower Unilateral Left    Standing Status:   Future    Standing Expiration Date:   01/31/2024    Order Specific Question:   Reason for Exam (SYMPTOM  OR DIAGNOSIS REQUIRED)    Answer:   left leg swelling    Order Specific Question:   Preferred imaging location?    Answer:   [facility]   APTT    Standing Status:   Future    Standing Expiration Date:   02/01/2024   CBC with Differential/Platelet    Standing Status:   Future    Standing Expiration Date:   02/01/2024   Comprehensive metabolic panel    Standing Status:   Future    Standing Expiration Date:   01/31/2024   AFP tumor marker     Standing Status:   Future    Standing Expiration Date:   01/31/2024   Protime-INR    Standing Status:   Future    Standing Expiration Date:   02/01/2024   Follow-up in 6 months. All questions were answered. The patient knows to call the clinic with any problems, questions or concerns.  Earlie Server, MD, PhD [facility] Hematology Oncology 01/31/2023    HISTORY OF PRESENTING ILLNESS:  Abigail Rice is a 57 y.o. female who was seen in consultation at the request of Mylinda Latina, PA* for evaluation of thrombocytopenia   Reviewed patient's labs done previously.  Labs showed decreased platelet counts at 43,000, wbc 4.1 hemoglobin 10.7 Reviewed patient's previous labs. Thrombocytopenia is chronic chronic onset , since at least 2019.  Platelet count in 2019 was 1 43,000.  Starting August 2020, platelet count has been in the range of 50-60,000 No aggravating or elevated factors.  Associated symptoms or signs:  Denies weight loss, fever, chills, fatigue, night sweats.  Denies hematochezia, hematuria, hematemesis, epistaxis, black tarry stool.  easy bruising.   Context:  History of hepatitis or HIV infection, denies  Patient has a history of heavy alcohol use.  She reports that she has cut down her alcohol intake since last year and currently she drinks 3-4 beer per day.  With a history of seizure and currently on Keppra.  #Thrombocytopenia increased to 80,000 after a course of dexamethasone 40 mg daily for 4 days. Platelet count further decreased to 38,000 a few weeks later.  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  Findings are favored to be reactive in nature.FISH results is normal.  INTERVAL HISTORY Abigail Rice is a 57 y.o. female who has above history reviewed by me today presents for follow up visit for management of thrombocytopenia. Intermittent blood in the stool. She took dexamethasone 40 mg x 4  days for last visit. She reports having upcoming dental extraction at the end of February 2024. She follows up with gastroenterology Dr. Lily Kocher who recommends endoscopy.  She has not had endoscopy done due to not getting preprocedure labs done. She also noticed left lower extremity is bigger than right lower extremity, hardness.  No calf tenderness.  Review of Systems  Constitutional:  Negative for appetite change, chills, fatigue and fever.  HENT:   Negative for hearing loss and voice change.   Eyes:  Negative for eye problems.  Respiratory:  Negative for chest tightness and cough.   Cardiovascular:  Negative for chest pain.  Gastrointestinal:  Negative for abdominal distention, abdominal pain and blood in stool.       Intermittent blood in the stool.  Endocrine: Negative for hot flashes.  Genitourinary:  Negative for difficulty urinating and frequency.   Musculoskeletal:  Negative for arthralgias.  Skin:  Negative for itching and rash.  Neurological:  Negative for extremity weakness.  Hematological:  Negative for adenopathy.  Psychiatric/Behavioral:  Negative for confusion.     MEDICAL HISTORY:  Past Medical History:  Diagnosis Date   Bipolar disorder ([place])    Depression    Diabetes mellitus without complication ([place])    History of seizure 2020   history of 1 seizure   Hyperlipidemia    Hypertension     SURGICAL HISTORY: Past Surgical History:  Procedure Laterality Date   CESAREAN SECTION     COLONOSCOPY WITH PROPOFOL N/A 03/29/2016   Procedure: COLONOSCOPY WITH PROPOFOL;  Surgeon: Josefine Class, MD;  Location: [facility] ENDOSCOPY;  Service: Endoscopy;  Laterality: N/A;   ESOPHAGOGASTRODUODENOSCOPY (EGD) WITH PROPOFOL N/A 09/20/2021   Procedure: ESOPHAGOGASTRODUODENOSCOPY (EGD) WITH PROPOFOL;  Surgeon: Jonathon Bellows, MD;  Location: [facility] ENDOSCOPY;  Service: Gastroenterology;  Laterality: N/A;   TUBAL LIGATION      SOCIAL HISTORY: Social History   Socioeconomic History    Marital status: Married    Spouse name: Not on file   Number of children: Not on file   Years of education: Not on file   Highest education level: Not on file  Occupational History   Not on file  Tobacco Use   Smoking status: Every Day    Packs/day: 0.50    Years: 25.00    Total pack years: 12.50    Types: Cigarettes   Smokeless tobacco: Never   Tobacco comments:    6 -7  cigarettes   Vaping Use   Vaping Use: Never used  Substance and Sexual Activity   Alcohol use: Yes    Alcohol/week: 2.0 standard drinks of alcohol    Types: 2 Cans of beer per week    Comment: daily    Drug use: No   Sexual activity: Not on file  Other Topics Concern   Not on file  Social History Narrative   Not on file   Social Determinants of Health   Financial Resource Strain: Not on file  Food Insecurity: Not on file  Transportation Needs: Not on file  Physical Activity: Not on file  Stress: Not on file  Social Connections: Not  on file  Intimate Partner Violence: Not on file    FAMILY HISTORY: Family History  Problem Relation Age of Onset   Diabetes Mother    Hypertension Mother    Breast cancer Sister     ALLERGIES:  is allergic to ace inhibitors, ciprofloxacin, lisinopril, risperdal [risperidone], vytorin [ezetimibe-simvastatin], and macrobid [nitrofurantoin].  MEDICATIONS:  Current Outpatient Medications  Medication Sig Dispense Refill   amLODipine (NORVASC) 5 MG tablet TAKE 1 TABLET (5 MG TOTAL) BY MOUTH DAILY. 90 tablet 1   Apple Cider Vinegar 188 MG CAPS Take 1 capsule by mouth daily.     cholecalciferol (VITAMIN D3) 25 MCG (1000 UNIT) tablet Take 1,000 Units by mouth daily.     Insulin Lispro Prot & Lispro (HUMALOG MIX 75/25 KWIKPEN) (75-25) 100 UNIT/ML Kwikpen INJECT 20 UNITS INTO THE SKIN 2 (TWO) TIMES DAILY WITH A MEAL. 15 mL 11   Insulin Pen Needle (BD PEN NEEDLE NANO 2ND GEN) 32G X 4 MM MISC USE AS DIRECTED 4 times a day with insulin pens 400 each 3   levETIRAcetam (KEPPRA)  500 MG tablet TAKE 1 TABLET BY MOUTH TWICE A DAY (Patient taking differently: Take 0.5 tablets by mouth daily.) 180 tablet 1   levothyroxine (SYNTHROID) 50 MCG tablet Take 1 tablet (50 mcg total) by mouth daily before breakfast. 90 tablet 1   losartan (COZAAR) 50 MG tablet Take 1 tablet (50 mg total) by mouth daily. 90 tablet 3   rOPINIRole (REQUIP) 0.25 MG tablet TAKE 1-2 TABLETS BY MOUTH AT BEDTIME AS NEEDED 180 tablet 1   insulin lispro (HUMALOG) 100 UNIT/ML KwikPen Inject into the skin. Use at lunch with sliding scale may use up to 10 units [place] (Patient not taking: Reported on 01/31/2023) 3 mL 11   No current facility-administered medications for this visit.     PHYSICAL EXAMINATION: ECOG PERFORMANCE STATUS: 1 - Symptomatic but completely ambulatory Vitals:   01/31/23 1207  BP: 125/76  Pulse: 75  Resp: 18  Temp: (!) 97.4 F (36.3 C)   Filed Weights   01/31/23 1207  Weight: 169 lb 6.4 oz (76.8 kg)    Physical Exam Constitutional:      General: She is not in acute distress. HENT:     Head: Normocephalic and atraumatic.  Eyes:     General: No scleral icterus. Cardiovascular:     Rate and Rhythm: Normal rate and regular rhythm.     Heart sounds: Normal heart sounds.  Pulmonary:     Effort: Pulmonary effort is normal. No respiratory distress.     Breath sounds: No wheezing.  Abdominal:     General: Bowel sounds are normal. There is no distension.     Palpations: Abdomen is soft.  Musculoskeletal:        General: No deformity. Normal range of motion.     Cervical back: Normal range of motion and neck supple.     Left lower leg: Edema present.  Skin:    General: Skin is warm and dry.     Findings: No erythema or rash.  Neurological:     Mental Status: She is alert and oriented to person, place, and time. Mental status is at baseline.     Cranial Nerves: No cranial nerve deficit.     Coordination: Coordination normal.  Psychiatric:        Mood and Affect: Mood normal.       LABORATORY DATA:  I have reviewed the data as listed    Latest Ref  Rng & Units 01/31/2023   11:45 AM 08/01/2022    2:09 PM 03/14/2022   10:09 AM  CBC  WBC 4.0 - 10.5 K/uL 5.7  5.9  4.8   Hemoglobin 12.0 - 15.0 g/dL 10.2  10.4  10.6   Hematocrit 36.0 - 46.0 % 31.4  31.4  31.5   Platelets 150 - 400 K/uL 70  42  36       Latest Ref Rng & Units 01/31/2023   11:45 AM 08/01/2022    2:09 PM 06/13/2022    2:07 PM  CMP  Glucose 70 - 99 mg/dL 147  155    BUN 6 - 20 mg/dL 10  14    Creatinine 0.44 - 1.00 mg/dL 0.94  1.23    Sodium 135 - 145 mmol/L 130  134    Potassium 3.5 - 5.1 mmol/L 3.6  3.7    Chloride 98 - 111 mmol/L 100  105    CO2 22 - 32 mmol/L 21  24    Calcium 8.9 - 10.3 mg/dL 8.2  8.5  8.1   Total Protein 6.5 - 8.1 g/dL 7.8  7.2    Total Bilirubin 0.3 - 1.2 mg/dL 0.7  0.5    Alkaline Phos 38 - 126 U/L 142  214    AST 15 - 41 U/L 56  67    ALT 0 - 44 U/L 41  65       Iron/TIBC/Ferritin/ %Sat    Component Value Date/Time   IRON 156 01/17/2021 1120   TIBC 206 (L) 01/17/2021 1120   FERRITIN 595 (H) 01/17/2021 1120   IRONPCTSAT 76 (H) 01/17/2021 1120      RADIOGRAPHIC STUDIES: I have personally reviewed the radiological images as listed and agreed with the findings in the report.    12/13/2020, ultrasound abdomen limited-images are not available to me Impression, mildly increased echogenicity of the liver.  Likely fatty infiltration.  Liver unremarkable contour, echotexture.  Normal size of liver and spleen.  There are innumerable tiny scattered calcifications throughout the spleen.

## 2023-01-31 NOTE — Assessment & Plan Note (Signed)
Recommend patient to follow-up with gastroenterology Dr. Vicente Males.   It was recommended to repeat endoscopy after repeating blood work. Patient prefers to defer for now due to her dental issue.  She plans to call GI office later.

## 2023-03-28 ENCOUNTER — Encounter: Payer: Self-pay | Admitting: Physician Assistant

## 2023-03-28 ENCOUNTER — Ambulatory Visit: Payer: Commercial Managed Care - PPO | Admitting: Physician Assistant

## 2023-03-28 VITALS — BP 129/74 | HR 79 | Temp 98.1°F | Resp 16 | Ht 65.0 in | Wt 168.2 lb

## 2023-03-28 DIAGNOSIS — Z794 Long term (current) use of insulin: Secondary | ICD-10-CM | POA: Diagnosis not present

## 2023-03-28 DIAGNOSIS — E1165 Type 2 diabetes mellitus with hyperglycemia: Secondary | ICD-10-CM

## 2023-03-28 DIAGNOSIS — D696 Thrombocytopenia, unspecified: Secondary | ICD-10-CM | POA: Diagnosis not present

## 2023-03-28 DIAGNOSIS — I10 Essential (primary) hypertension: Secondary | ICD-10-CM

## 2023-03-28 NOTE — Progress Notes (Signed)
[facility] [address] [place], [place] [address]  Internal MEDICINE  Office Visit Note  Patient Name: Abigail Rice  [id number]  [id number]  Date of Service: 04/07/2023  Chief Complaint  Patient presents with   Depression   Diabetes   Hypertension   Hyperlipidemia    HPI Pt is here for routine follow up -Did have all her teeth pulled and now has dentures, has follow up Monday with the dentist -Has been doing 18units BID some days instead of 20 BID due to sugar dropping, plus sliding scale at lunch. Will switch instructions to 18 BID and drop to 16 if needed if sugars dropping -Platelets improved which allowed for dental surgery, then will schedule colonoscopy once able -BP stable  Current Medication: Outpatient Encounter Medications as of 03/28/2023  Medication Sig   amLODipine (NORVASC) 5 MG tablet TAKE 1 TABLET (5 MG TOTAL) BY MOUTH DAILY.   Apple Cider Vinegar 188 MG CAPS Take 1 capsule by mouth daily.   cholecalciferol (VITAMIN D3) 25 MCG (1000 UNIT) tablet Take 1,000 Units by mouth daily.   insulin lispro (HUMALOG) 100 UNIT/ML KwikPen Inject into the skin. Use at lunch with sliding scale may use up to 10 units [place]   Insulin Lispro Prot & Lispro (HUMALOG MIX 75/25 KWIKPEN) (75-25) 100 UNIT/ML Kwikpen INJECT 20 UNITS INTO THE SKIN 2 (TWO) TIMES DAILY WITH A MEAL.   Insulin Pen Needle (BD PEN NEEDLE NANO 2ND GEN) 32G X 4 MM MISC USE AS DIRECTED 4 times a day with insulin pens   levETIRAcetam (KEPPRA) 500 MG tablet TAKE 1 TABLET BY MOUTH TWICE A DAY (Patient taking differently: Take 0.5 tablets by mouth daily.)   losartan (COZAAR) 50 MG tablet Take 1 tablet (50 mg total) by mouth daily.   rOPINIRole (REQUIP) 0.25 MG tablet TAKE 1-2 TABLETS BY MOUTH AT BEDTIME AS NEEDED   [DISCONTINUED] levothyroxine (SYNTHROID) 50 MCG tablet Take 1 tablet (50 mcg total) by mouth daily before breakfast.   No facility-administered encounter medications on file as of 03/28/2023.     Surgical History: Past Surgical History:  Procedure Laterality Date   CESAREAN SECTION     COLONOSCOPY WITH PROPOFOL N/A 03/29/2016   Procedure: COLONOSCOPY WITH PROPOFOL;  Surgeon: Elnita Maxwell, MD;  Location[facility] ENDOSCOPY;  Service: Endoscopy;  Laterality: N/A;   ESOPHAGOGASTRODUODENOSCOPY (EGD) WITH PROPOFOL N/A 09/20/2021   Procedure: ESOPHAGOGASTRODUODENOSCOPY (EGD) WITH PROPOFOL;  Surgeon: Wyline Mood, MD;  Location: [facility] ENDOSCOPY;  Service: Gastroenterology;  Laterality: N/A;   TUBAL LIGATION      Medical History: Past Medical History:  Diagnosis Date   Bipolar disorder    Depression    Diabetes mellitus without complication    History of seizure 2020   history of 1 seizure   Hyperlipidemia    Hypertension     Family History: Family History  Problem Relation Age of Onset   Diabetes Mother    Hypertension Mother    Breast cancer Sister     Social History   Socioeconomic History   Marital status: Married    Spouse name: Not on file   Number of children: Not on file   Years of education: Not on file   Highest education level: Not on file  Occupational History   Not on file  Tobacco Use   Smoking status: Every Day    Packs/day: 0.50    Years: 25.00    Additional pack years: 0.00    Total pack years: 12.50  Types: Cigarettes   Smokeless tobacco: Never   Tobacco comments:    6 -7  cigarettes   Vaping Use   Vaping Use: Never used  Substance and Sexual Activity   Alcohol use: Yes    Alcohol/week: 2.0 standard drinks of alcohol    Types: 2 Cans of beer per week    Comment: daily    Drug use: No   Sexual activity: Not on file  Other Topics Concern   Not on file  Social History Narrative   Not on file   Social Determinants of Health   Financial Resource Strain: Not on file  Food Insecurity: Not on file  Transportation Needs: Not on file  Physical Activity: Not on file  Stress: Not on file  Social Connections: Not on file  Intimate  Partner Violence: Not on file      Review of Systems  Constitutional:  Negative for chills, fatigue and unexpected weight change.  HENT:  Negative for congestion, postnasal drip, rhinorrhea, sneezing and sore throat.   Eyes:  Negative for redness.  Respiratory:  Negative for cough, chest tightness and shortness of breath.   Cardiovascular:  Negative for chest pain and palpitations.  Gastrointestinal:  Negative for abdominal pain, constipation, diarrhea, nausea and vomiting.  Genitourinary:  Negative for dysuria and frequency.  Musculoskeletal:  Negative for arthralgias, back pain, joint swelling and neck pain.  Skin:  Negative for rash.  Neurological: Negative.  Negative for tremors and numbness.  Hematological:  Negative for adenopathy. Does not bruise/bleed easily.  Psychiatric/Behavioral:  Negative for behavioral problems (Depression), sleep disturbance and suicidal ideas. The patient is not nervous/anxious.     Vital Signs: BP 129/74   Pulse 79   Temp 98.1 F (36.7 C)   Resp 16   Ht 5\' 5"  (1.651 m)   Wt 168 lb 3.2 oz (76.3 kg)   LMP 11/07/2015 (Approximate)   SpO2 99%   BMI 27.99 kg/m    Physical Exam Vitals and nursing note reviewed.  Constitutional:      General: She is not in acute distress.    Appearance: She is well-developed. She is obese. She is not diaphoretic.  HENT:     Head: Normocephalic and atraumatic.     Mouth/Throat:     Pharynx: No oropharyngeal exudate.  Eyes:     Pupils: Pupils are equal, round, and reactive to light.  Neck:     Thyroid: No thyromegaly.     Vascular: No JVD.     Trachea: No tracheal deviation.  Cardiovascular:     Rate and Rhythm: Normal rate and regular rhythm.     Heart sounds: Normal heart sounds. No murmur heard.    No friction rub. No gallop.  Pulmonary:     Effort: Pulmonary effort is normal. No respiratory distress.     Breath sounds: No wheezing or rales.  Chest:     Chest wall: No tenderness.  Abdominal:      General: Bowel sounds are normal.     Palpations: Abdomen is soft.     Tenderness: There is no abdominal tenderness.  Musculoskeletal:        General: Normal range of motion.     Cervical back: Normal range of motion and neck supple.  Lymphadenopathy:     Cervical: No cervical adenopathy.  Skin:    General: Skin is warm and dry.  Neurological:     Mental Status: She is alert and oriented to person, place, and time.  Cranial Nerves: No cranial nerve deficit.  Psychiatric:        Behavior: Behavior normal.        Thought Content: Thought content normal.        Judgment: Judgment normal.        Assessment/Plan: 1. Type 2 diabetes mellitus with hyperglycemia, with long-term current use of insulin A1c normal last check, will recheck next visit. Will reduce insulin to 18units BID unless low sugar--in which case drop to 16units. Continue sliding scale at lunch.  2. Essential hypertension Stable, continue current medications  3. Thrombocytopenia Improving, followed by hematology   General Counseling: Sarina verbalizes understanding of the findings of todays visit and agrees with plan of treatment. I have discussed any further diagnostic evaluation that may be needed or ordered today. We also reviewed her medications today. she has been encouraged to call the office with any questions or concerns that should arise related to todays visit.    No orders of the defined types were placed in this encounter.   No orders of the defined types were placed in this encounter.   This patient was seen by Lynn ItoLauren Brendyn Mclaren, PA-C in collaboration with Dr. Beverely RisenFozia Khan as a part of collaborative care agreement.   Total time spent:30 Minutes Time spent includes review of chart, medications, test results, and follow up plan with the patient.      Dr Lyndon CodeFozia M Khan Internal medicine

## 2023-03-29 ENCOUNTER — Other Ambulatory Visit: Payer: Self-pay | Admitting: Physician Assistant

## 2023-03-29 DIAGNOSIS — E039 Hypothyroidism, unspecified: Secondary | ICD-10-CM

## 2023-03-30 IMAGING — US US ABDOMEN LIMITED
1 series · 14 of 25 positions shown · non-contrast
Comparison: Monday August, 2015.  Abdominal CT from 1628.

CLINICAL DATA: Abnormal liver function of 54-year-old female.

EXAM:
ULTRASOUND ABDOMEN LIMITED RIGHT UPPER QUADRANT

[Series 1: us abdomen limited ruq (liver/gb) · 14 of 52 slices shown]
[im 1/52]
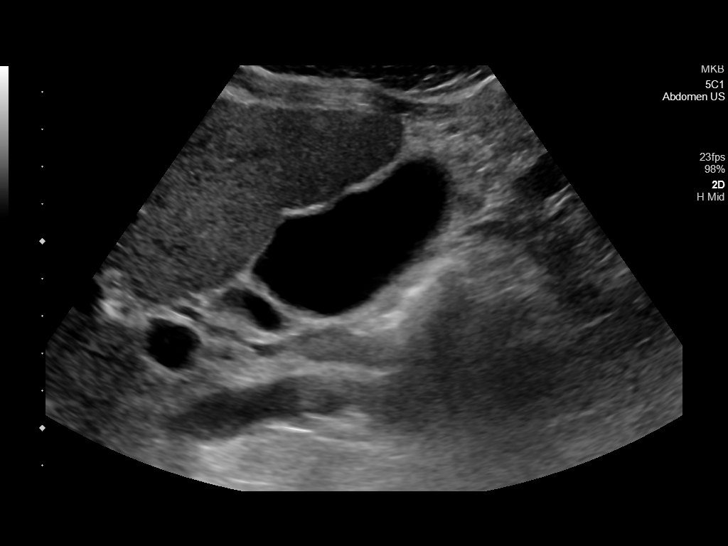
[im 5/52]
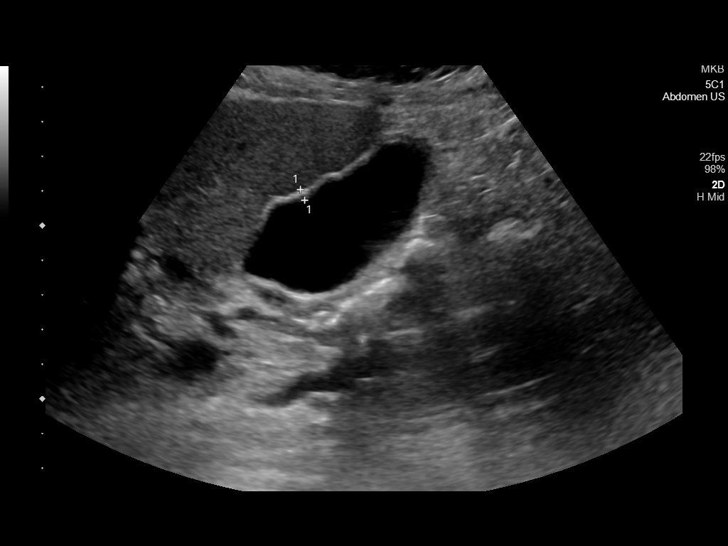
[im 9/52]
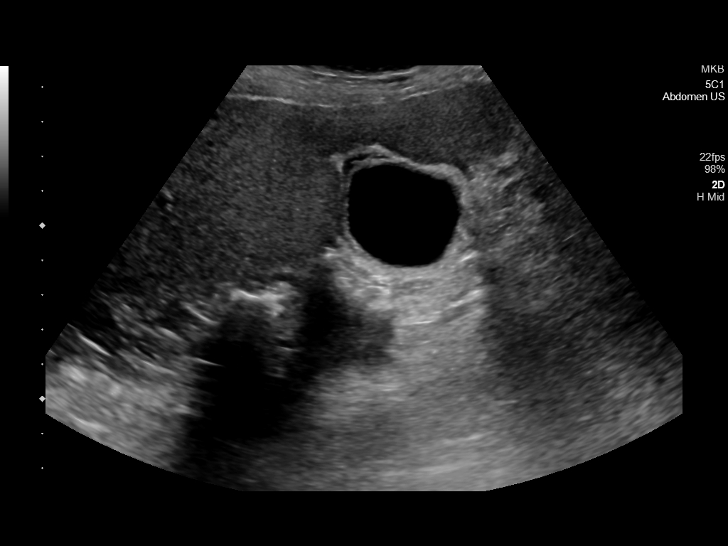
[im 13/52]
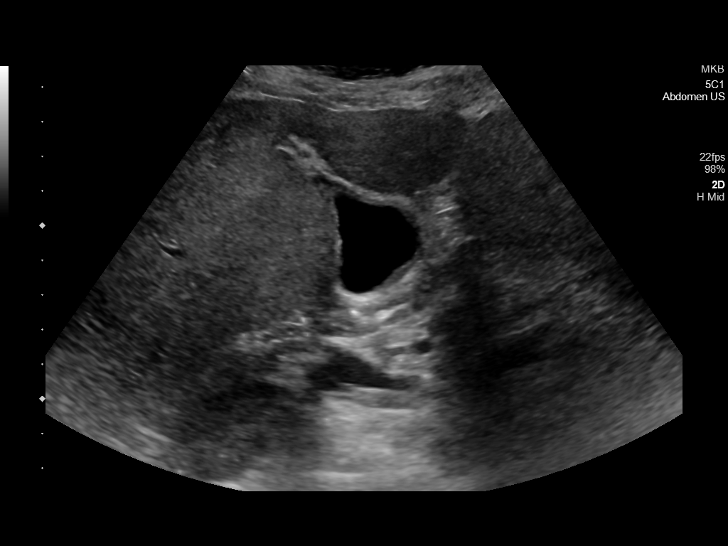
[im 18/52]
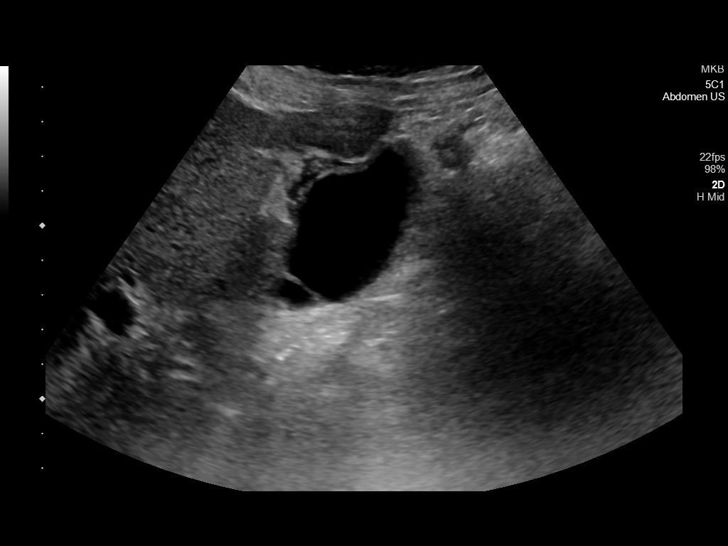
[im 20/52]
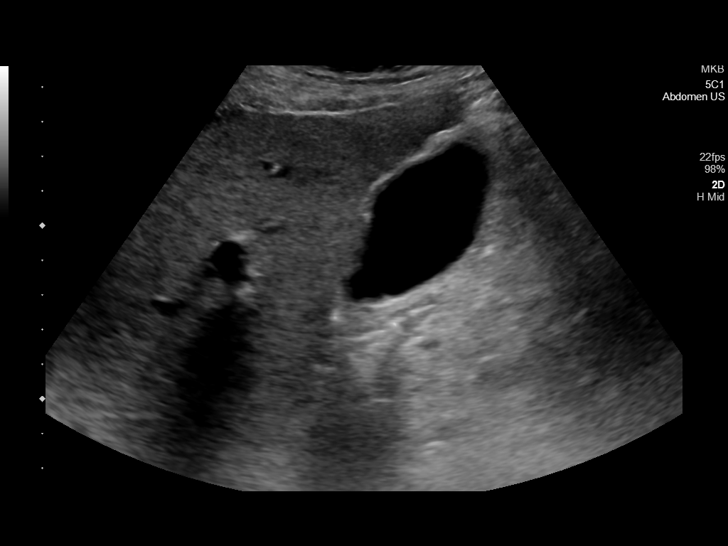
[im 24/52]
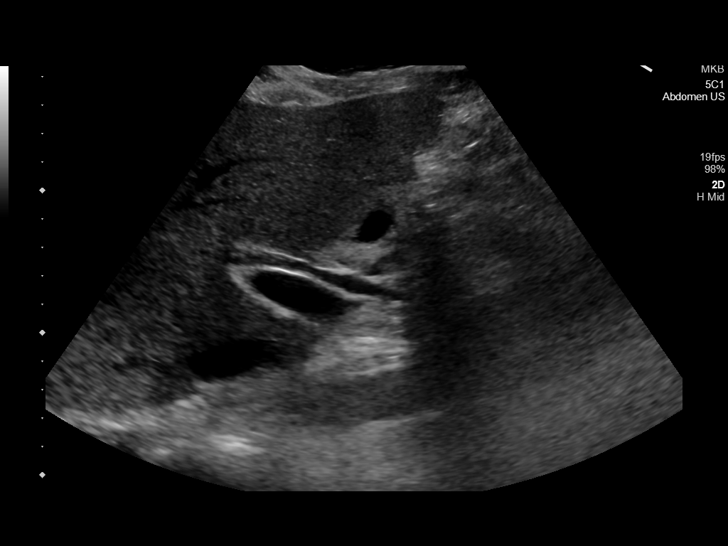
[im 28/52]
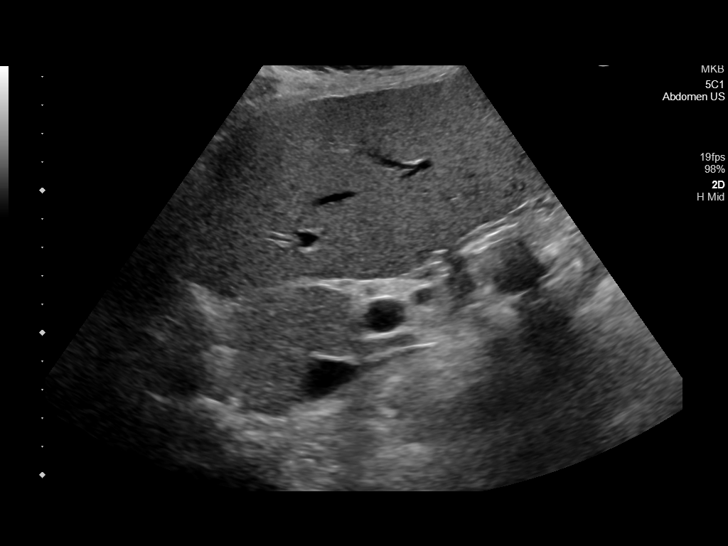
[im 32/52]
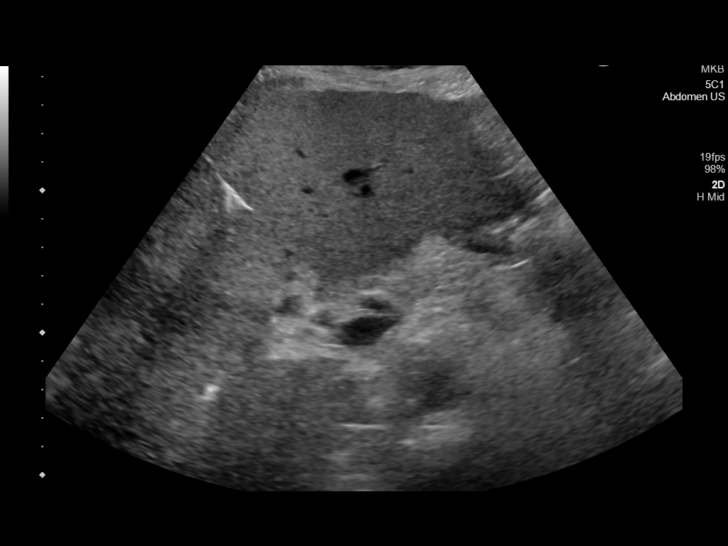
[im 35/52]
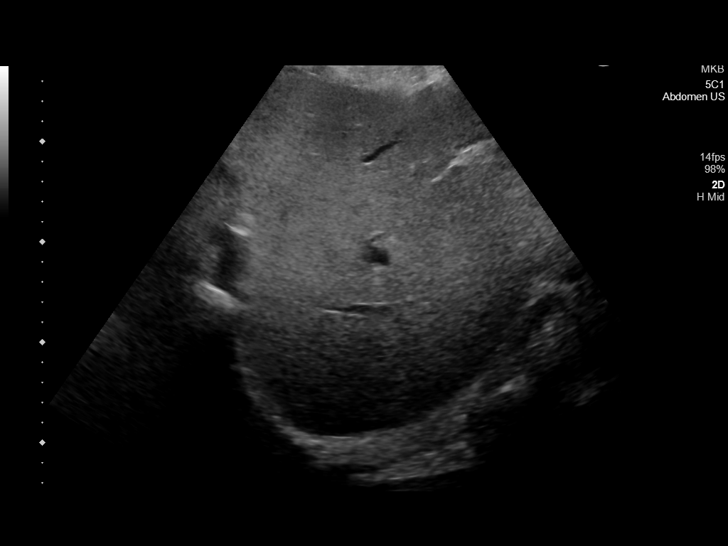
[im 39/52]
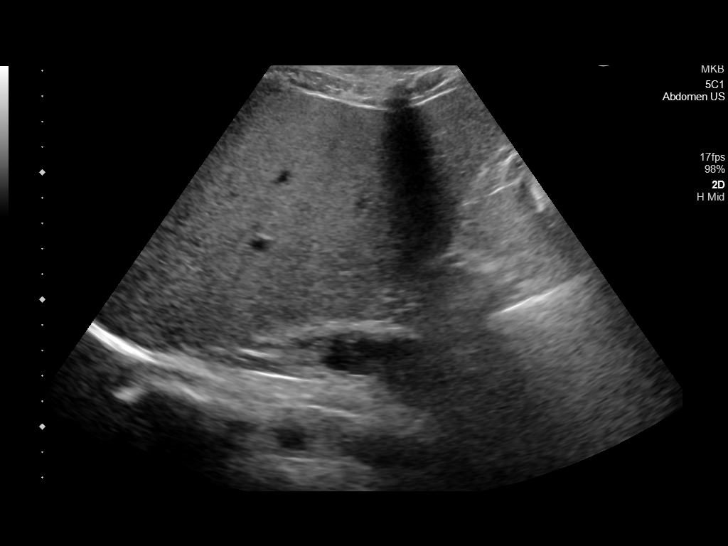
[im 43/52]
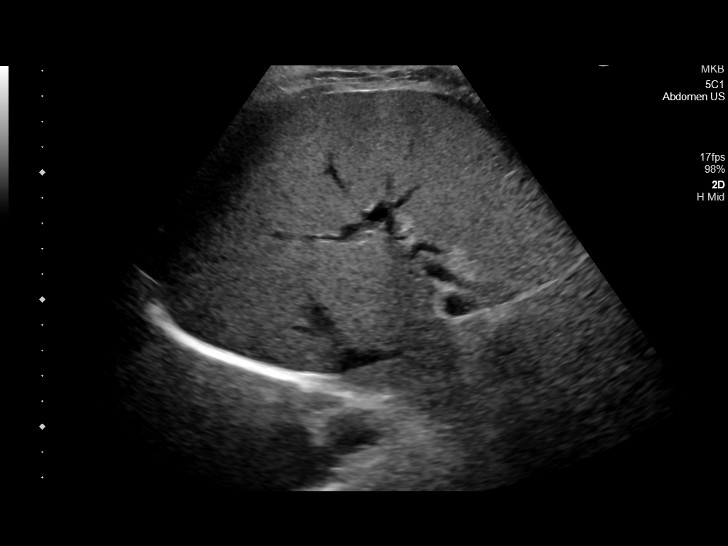
[im 47/52]
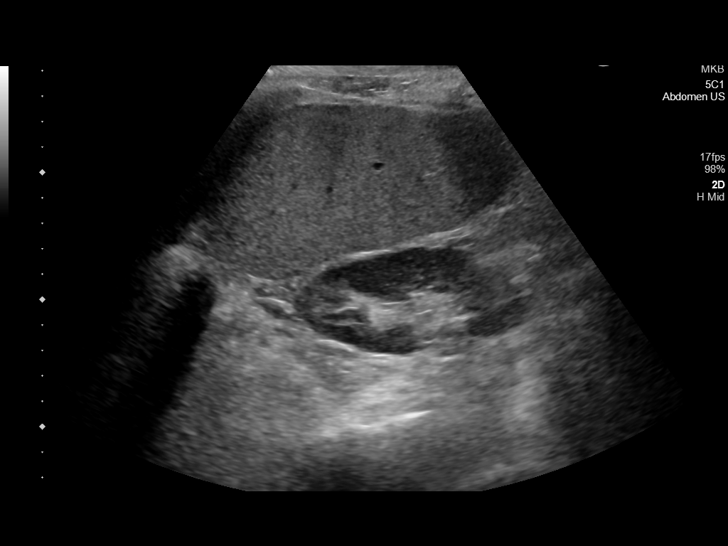
[im 52/52]
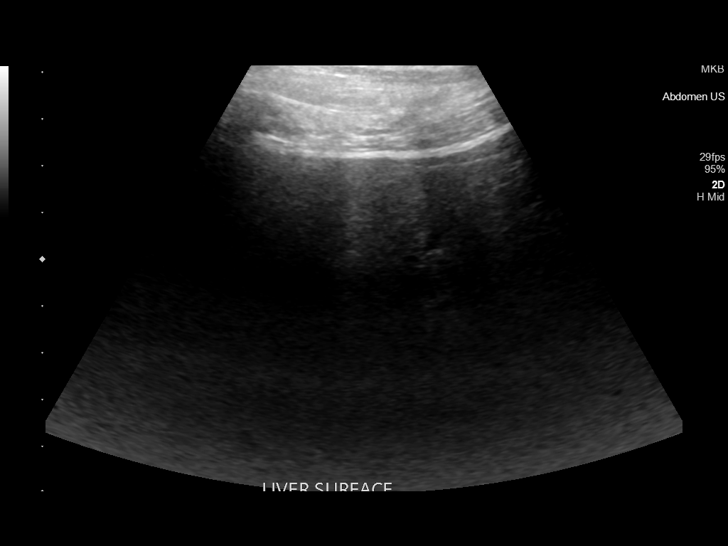

[14 of 25 positions shown; findings below may reference images not displayed]

FINDINGS: Gallbladder:

No gallstones, wall thickness at 3 mm, under distension of the
gallbladder. No sonographic Murphy sign noted by sonographer. Trace
pericholecystic fluid, more fluid about the liver.

Common bile duct:

Diameter: 5.5 mm.

Liver:

Mildly increased hepatic echogenicity. Nodular hepatic contours with
signs of fissural widening. No visible lesion on submitted images
portal vein is patent on color Doppler imaging with normal direction
of blood flow towards the liver.

Other: Trace ascites about the liver.
IMPRESSION: Hepatic steatosis with nodular and lobular hepatic contour
suspicious for liver disease/early cirrhosis.

Trace ascites.

No acute biliary findings, minimal wall thickening and trace fluid
about the liver likely related to liver disease.

## 2023-06-18 ENCOUNTER — Telehealth: Payer: Self-pay

## 2023-06-18 NOTE — Telephone Encounter (Signed)
Patient states she called on Friday after 12 and left a voicemail on the main line but did not realized we closed at 12 on Fridays. She states she was calling because she is wanting to schedule a colonoscopy for a Thursday with Dr. Tobi Bastos.

## 2023-06-19 NOTE — Telephone Encounter (Signed)
Called patient back and left her a voicemail with Dr. Johnney Killian last recommendation when he saw her last. I also sent her a patient message since I saw that she is active on my chart and reads her messages.

## 2023-06-25 ENCOUNTER — Other Ambulatory Visit: Payer: Self-pay | Admitting: Physician Assistant

## 2023-06-25 DIAGNOSIS — I10 Essential (primary) hypertension: Secondary | ICD-10-CM

## 2023-06-25 DIAGNOSIS — R569 Unspecified convulsions: Secondary | ICD-10-CM

## 2023-07-04 ENCOUNTER — Ambulatory Visit: Payer: BC Managed Care – PPO | Admitting: Physician Assistant

## 2023-07-10 ENCOUNTER — Ambulatory Visit (INDEPENDENT_AMBULATORY_CARE_PROVIDER_SITE_OTHER): Payer: BC Managed Care – PPO | Admitting: Physician Assistant

## 2023-07-10 ENCOUNTER — Encounter: Payer: Self-pay | Admitting: Physician Assistant

## 2023-07-10 VITALS — BP 125/65 | HR 89 | Temp 97.6°F | Resp 16 | Ht 65.0 in | Wt 160.2 lb

## 2023-07-10 DIAGNOSIS — E039 Hypothyroidism, unspecified: Secondary | ICD-10-CM | POA: Diagnosis not present

## 2023-07-10 DIAGNOSIS — Z794 Long term (current) use of insulin: Secondary | ICD-10-CM | POA: Diagnosis not present

## 2023-07-10 DIAGNOSIS — E1165 Type 2 diabetes mellitus with hyperglycemia: Secondary | ICD-10-CM

## 2023-07-10 DIAGNOSIS — I10 Essential (primary) hypertension: Secondary | ICD-10-CM

## 2023-07-10 LAB — POCT GLYCOSYLATED HEMOGLOBIN (HGB A1C): Hemoglobin A1C: 5.1 % (ref 4.0–5.6)

## 2023-07-10 MED ORDER — INSULIN LISPRO PROT & LISPRO (75-25 MIX) 100 UNIT/ML KWIKPEN: PEN_INJECTOR | SUBCUTANEOUS | 11 refills | Status: DC

## 2023-07-10 MED ORDER — LEVOTHYROXINE SODIUM 50 MCG PO TABS
50.0000 ug | ORAL_TABLET | Freq: Every day | ORAL | 1 refills | Status: DC
Start: 2023-07-10 — End: 2023-09-26

## 2023-07-10 MED ORDER — LOSARTAN POTASSIUM 50 MG PO TABS
50.0000 mg | ORAL_TABLET | Freq: Every day | ORAL | 3 refills | Status: DC
Start: 2023-07-10 — End: 2023-09-26

## 2023-07-10 NOTE — Progress Notes (Signed)
[facility] [address] [place], [place] [address]  Internal MEDICINE  Office Visit Note  Patient Name: Abigail Rice  [id number]  [id number]  Date of Service: 07/10/2023  Chief Complaint  Patient presents with   Follow-up   Depression   Diabetes   Hypertension   Hyperlipidemia    HPI Pt is here for routine follow up -weaned down insulin and sliding scale, sugars have been controlled. Down to 14-16units BID and maybe 0-4 at lunch  -will go ahead and hold lunch sliding scale and drop mix down down to 14units BID and continue to drop pending sugars -down 8 lbs since last visit partially due to new dentures and changing diet habits and is trying to maintain these diet changes. -Will be scheduling colonoscopy, however states GI wants her to follow back up with heme/onc prior -has heme/onc visit in August with labs -has CPE in Oct, will hold off on ordering labs until heme/onc labs done to avoid duplicates. Will order at CPE  Current Medication: Outpatient Encounter Medications as of 07/10/2023  Medication Sig   amLODipine (NORVASC) 5 MG tablet TAKE 1 TABLET (5 MG TOTAL) BY MOUTH DAILY.   Apple Cider Vinegar 188 MG CAPS Take 1 capsule by mouth daily.   cholecalciferol (VITAMIN D3) 25 MCG (1000 UNIT) tablet Take 1,000 Units by mouth daily.   insulin lispro (HUMALOG) 100 UNIT/ML KwikPen Inject into the skin. Use at lunch with sliding scale may use up to 10 units [place]   Insulin Pen Needle (BD PEN NEEDLE NANO 2ND GEN) 32G X 4 MM MISC USE AS DIRECTED 4 times a day with insulin pens   levETIRAcetam (KEPPRA) 500 MG tablet TAKE 1 TABLET BY MOUTH TWICE A DAY   rOPINIRole (REQUIP) 0.25 MG tablet TAKE 1-2 TABLETS BY MOUTH AT BEDTIME AS NEEDED   [DISCONTINUED] Insulin Lispro Prot & Lispro (HUMALOG MIX 75/25 KWIKPEN) (75-25) 100 UNIT/ML Kwikpen INJECT 20 UNITS INTO THE SKIN 2 (TWO) TIMES DAILY WITH A MEAL.   [DISCONTINUED] levothyroxine (SYNTHROID) 50 MCG tablet TAKE 1 TABLET BY MOUTH  DAILY BEFORE BREAKFAST   [DISCONTINUED] losartan (COZAAR) 50 MG tablet Take 1 tablet (50 mg total) by mouth daily.   Insulin Lispro Prot & Lispro (HUMALOG MIX 75/25 KWIKPEN) (75-25) 100 UNIT/ML Kwikpen INJECT 14 UNITS INTO THE SKIN 2 (TWO) TIMES DAILY WITH A MEAL.   levothyroxine (SYNTHROID) 50 MCG tablet Take 1 tablet (50 mcg total) by mouth daily before breakfast.   losartan (COZAAR) 50 MG tablet Take 1 tablet (50 mg total) by mouth daily.   No facility-administered encounter medications on file as of 07/10/2023.    Surgical History: Past Surgical History:  Procedure Laterality Date   CESAREAN SECTION     COLONOSCOPY WITH PROPOFOL N/A 03/29/2016   Procedure: COLONOSCOPY WITH PROPOFOL;  Surgeon: Elnita Maxwell, MD;  Location: [facility] ENDOSCOPY;  Service: Endoscopy;  Laterality: N/A;   ESOPHAGOGASTRODUODENOSCOPY (EGD) WITH PROPOFOL N/A 09/20/2021   Procedure: ESOPHAGOGASTRODUODENOSCOPY (EGD) WITH PROPOFOL;  Surgeon: Wyline Mood, MD;  Location: [facility] ENDOSCOPY;  Service: Gastroenterology;  Laterality: N/A;   TUBAL LIGATION      Medical History: Past Medical History:  Diagnosis Date   Bipolar disorder (HCC)    Depression    Diabetes mellitus without complication (HCC)    History of seizure 2020   history of 1 seizure   Hyperlipidemia    Hypertension     Family History: Family History  Problem Relation Age of Onset   Diabetes Mother  Hypertension Mother    Breast cancer Sister     Social History   Socioeconomic History   Marital status: Married    Spouse name: Not on file   Number of children: Not on file   Years of education: Not on file   Highest education level: Not on file  Occupational History   Not on file  Tobacco Use   Smoking status: Every Day    Current packs/day: 0.50    Average packs/day: 0.5 packs/day for 25.0 years (12.5 ttl pk-yrs)    Types: Cigarettes   Smokeless tobacco: Never   Tobacco comments:    6 -7  cigarettes   Vaping Use   Vaping status:  Never Used  Substance and Sexual Activity   Alcohol use: Yes    Alcohol/week: 2.0 standard drinks of alcohol    Types: 2 Cans of beer per week    Comment: daily    Drug use: No   Sexual activity: Not on file  Other Topics Concern   Not on file  Social History Narrative   Not on file   Social Determinants of Health   Financial Resource Strain: Not on file  Food Insecurity: Not on file  Transportation Needs: Not on file  Physical Activity: Not on file  Stress: Not on file  Social Connections: Not on file  Intimate Partner Violence: Not on file      Review of Systems  Constitutional:  Negative for chills, fatigue and unexpected weight change.  HENT:  Negative for congestion, postnasal drip, rhinorrhea, sneezing and sore throat.   Eyes:  Negative for redness.  Respiratory:  Negative for cough, chest tightness and shortness of breath.   Cardiovascular:  Negative for chest pain and palpitations.  Gastrointestinal:  Negative for abdominal pain, constipation, diarrhea, nausea and vomiting.  Genitourinary:  Negative for dysuria and frequency.  Musculoskeletal:  Negative for arthralgias, back pain, joint swelling and neck pain.  Skin:  Negative for rash.  Neurological: Negative.  Negative for tremors and numbness.  Hematological:  Negative for adenopathy. Does not bruise/bleed easily.  Psychiatric/Behavioral:  Negative for behavioral problems (Depression), sleep disturbance and suicidal ideas. The patient is not nervous/anxious.     Vital Signs: BP 125/65   Pulse 89   Temp 97.6 F (36.4 C)   Resp 16   Ht 5\' 5"  (1.651 m)   Wt 160 lb 3.2 oz (72.7 kg)   LMP 11/07/2015 (Approximate)   SpO2 98%   BMI 26.66 kg/m    Physical Exam Vitals and nursing note reviewed.  Constitutional:      General: She is not in acute distress.    Appearance: She is well-developed. She is obese. She is not diaphoretic.  HENT:     Head: Normocephalic and atraumatic.     Mouth/Throat:      Pharynx: No oropharyngeal exudate.  Eyes:     Pupils: Pupils are equal, round, and reactive to light.  Neck:     Thyroid: No thyromegaly.     Vascular: No JVD.     Trachea: No tracheal deviation.  Cardiovascular:     Rate and Rhythm: Normal rate and regular rhythm.     Heart sounds: Normal heart sounds. No murmur heard.    No friction rub. No gallop.  Pulmonary:     Effort: Pulmonary effort is normal. No respiratory distress.     Breath sounds: No wheezing or rales.  Chest:     Chest wall: No tenderness.  Abdominal:  General: Bowel sounds are normal.     Palpations: Abdomen is soft.     Tenderness: There is no abdominal tenderness.  Musculoskeletal:        General: Normal range of motion.     Cervical back: Normal range of motion and neck supple.  Lymphadenopathy:     Cervical: No cervical adenopathy.  Skin:    General: Skin is warm and dry.  Neurological:     Mental Status: She is alert and oriented to person, place, and time.     Cranial Nerves: No cranial nerve deficit.  Psychiatric:        Behavior: Behavior normal.        Thought Content: Thought content normal.        Judgment: Judgment normal.        Assessment/Plan: 1. Type 2 diabetes mellitus with hyperglycemia, with long-term current use of insulin (HCC) - POCT HgB A1C is 5.1 which is normal and decreased from last visit, will continue to decrease insulin. Stop lunch sliding scale and will reduce mix to 14units BID and drop further pending sugars  2. Essential hypertension Stable, continue current medications - losartan (COZAAR) 50 MG tablet; Take 1 tablet (50 mg total) by mouth daily.  Dispense: 90 tablet; Refill: 3  3. Acquired hypothyroidism - levothyroxine (SYNTHROID) 50 MCG tablet; Take 1 tablet (50 mcg total) by mouth daily before breakfast.  Dispense: 90 tablet; Refill: 1   General Counseling: Abigail Rice verbalizes understanding of the findings of todays visit and agrees with plan of treatment. I have  discussed any further diagnostic evaluation that may be needed or ordered today. We also reviewed her medications today. she has been encouraged to call the office with any questions or concerns that should arise related to todays visit.    Orders Placed This Encounter  Procedures   POCT HgB A1C    Meds ordered this encounter  Medications   Insulin Lispro Prot & Lispro (HUMALOG MIX 75/25 KWIKPEN) (75-25) 100 UNIT/ML Kwikpen    Sig: INJECT 14 UNITS INTO THE SKIN 2 (TWO) TIMES DAILY WITH A MEAL.    Dispense:  15 mL    Refill:  11   losartan (COZAAR) 50 MG tablet    Sig: Take 1 tablet (50 mg total) by mouth daily.    Dispense:  90 tablet    Refill:  3   levothyroxine (SYNTHROID) 50 MCG tablet    Sig: Take 1 tablet (50 mcg total) by mouth daily before breakfast.    Dispense:  90 tablet    Refill:  1    This patient was seen by Lynn Ito, PA-C in collaboration with Dr. Beverely Risen as a part of collaborative care agreement.   Total time spent:30 Minutes Time spent includes review of chart, medications, test results, and follow up plan with the patient.      Dr Lyndon Code Internal medicine

## 2023-08-01 ENCOUNTER — Inpatient Hospital Stay (HOSPITAL_BASED_OUTPATIENT_CLINIC_OR_DEPARTMENT_OTHER): Payer: BC Managed Care – PPO | Admitting: Oncology

## 2023-08-01 ENCOUNTER — Encounter: Payer: Self-pay | Admitting: Oncology

## 2023-08-01 ENCOUNTER — Ambulatory Visit: Payer: Commercial Managed Care - PPO | Admitting: Oncology

## 2023-08-01 ENCOUNTER — Other Ambulatory Visit: Payer: Commercial Managed Care - PPO

## 2023-08-01 ENCOUNTER — Inpatient Hospital Stay: Payer: BC Managed Care – PPO | Attending: Oncology

## 2023-08-01 VITALS — BP 114/71 | HR 69 | Temp 98.4°F | Resp 18 | Wt 162.2 lb

## 2023-08-01 DIAGNOSIS — D696 Thrombocytopenia, unspecified: Secondary | ICD-10-CM | POA: Diagnosis not present

## 2023-08-01 DIAGNOSIS — R748 Abnormal levels of other serum enzymes: Secondary | ICD-10-CM

## 2023-08-01 DIAGNOSIS — K7030 Alcoholic cirrhosis of liver without ascites: Secondary | ICD-10-CM | POA: Insufficient documentation

## 2023-08-01 DIAGNOSIS — R2231 Localized swelling, mass and lump, right upper limb: Secondary | ICD-10-CM | POA: Insufficient documentation

## 2023-08-01 DIAGNOSIS — F17210 Nicotine dependence, cigarettes, uncomplicated: Secondary | ICD-10-CM | POA: Diagnosis not present

## 2023-08-01 DIAGNOSIS — D649 Anemia, unspecified: Secondary | ICD-10-CM

## 2023-08-01 DIAGNOSIS — Z803 Family history of malignant neoplasm of breast: Secondary | ICD-10-CM | POA: Insufficient documentation

## 2023-08-01 LAB — CBC WITH DIFFERENTIAL/PLATELET
Abs Immature Granulocytes: 0 10*3/uL (ref 0.00–0.07)
Basophils Absolute: 0 10*3/uL (ref 0.0–0.1)
Basophils Relative: 0 %
Eosinophils Absolute: 0 10*3/uL (ref 0.0–0.5)
Eosinophils Relative: 0 %
HCT: 30.6 % — ABNORMAL LOW (ref 36.0–46.0)
Hemoglobin: 10.2 g/dL — ABNORMAL LOW (ref 12.0–15.0)
Immature Granulocytes: 0 %
Lymphocytes Relative: 65 %
Lymphs Abs: 3 10*3/uL (ref 0.7–4.0)
MCH: 31.6 pg (ref 26.0–34.0)
MCHC: 33.3 g/dL (ref 30.0–36.0)
MCV: 94.7 fL (ref 80.0–100.0)
Monocytes Absolute: 0.4 10*3/uL (ref 0.1–1.0)
Monocytes Relative: 8 %
Neutro Abs: 1.2 10*3/uL — ABNORMAL LOW (ref 1.7–7.7)
Neutrophils Relative %: 27 %
Platelets: 33 10*3/uL — ABNORMAL LOW (ref 150–400)
RBC: 3.23 MIL/uL — ABNORMAL LOW (ref 3.87–5.11)
RDW: 16.7 % — ABNORMAL HIGH (ref 11.5–15.5)
WBC: 4.6 10*3/uL (ref 4.0–10.5)
nRBC: 0 % (ref 0.0–0.2)

## 2023-08-01 LAB — COMPREHENSIVE METABOLIC PANEL
ALT: 82 U/L — ABNORMAL HIGH (ref 0–44)
AST: 96 U/L — ABNORMAL HIGH (ref 15–41)
Albumin: 2.9 g/dL — ABNORMAL LOW (ref 3.5–5.0)
Alkaline Phosphatase: 205 U/L — ABNORMAL HIGH (ref 38–126)
Anion gap: 6 (ref 5–15)
BUN: 16 mg/dL (ref 6–20)
CO2: 20 mmol/L — ABNORMAL LOW (ref 22–32)
Calcium: 8.4 mg/dL — ABNORMAL LOW (ref 8.9–10.3)
Chloride: 107 mmol/L (ref 98–111)
Creatinine, Ser: 1.19 mg/dL — ABNORMAL HIGH (ref 0.44–1.00)
GFR, Estimated: 53 mL/min — ABNORMAL LOW (ref >60)
Glucose, Bld: 120 mg/dL — ABNORMAL HIGH (ref 70–99)
Potassium: 3.9 mmol/L (ref 3.5–5.1)
Sodium: 133 mmol/L — ABNORMAL LOW (ref 135–145)
Total Bilirubin: 0.9 mg/dL (ref 0.3–1.2)
Total Protein: 7 g/dL (ref 6.5–8.1)

## 2023-08-01 LAB — PROTIME-INR
INR: 1.5 — ABNORMAL HIGH (ref 0.8–1.2)
Prothrombin Time: 18.5 seconds — ABNORMAL HIGH (ref 11.4–15.2)

## 2023-08-01 LAB — APTT: aPTT: 32 seconds (ref 24–36)

## 2023-08-01 NOTE — Progress Notes (Unsigned)
Hematology/Oncology Progress note [phone] [phone]    CHIEF COMPLAINTS/REASON FOR VISIT:  Thrombocytopenia  ASSESSMENT & PLAN:   Thrombocytopenia (HCC) #Thrombocytopenia possible due to peripheral destruction-possible ITP, chronic liver disease/alcohol consumption.  Previously platelet count improved after 4 days course of dexamethasone. Labs reviewed and discussed with patient. Platelet count 33,000. Given that she is going to have repeat colonoscopy, recommend patient to take a course of dexamethasone 40 mg daily for 4 days.  Elevated alkaline phosphatase level Alkaline phosphatase fractionated showed increase of bone fraction.  Recommend skeletal survey.  She declined.  Normocytic anemia Hemoglobin stable.  Continue observation.  Alcoholic cirrhosis (HCC) Recommend patient to follow-up with gastroenterology Dr. Tobi Bastos.   It was recommended to repeat endoscopy after repeating blood work. Patient prefers to defer for now due to her dental issue.  She plans to call GI office later.  Skin lump of arm, right Probably lipoma. She declined [place] for further evaluation.  Monitor  No orders of the defined types were placed in this encounter.  Follow-up in 6 months. All questions were answered. The patient knows to call the clinic with any problems, questions or concerns.  Rickard Patience, MD, PhD [facility] Hematology Oncology 08/01/2023    HISTORY OF PRESENTING ILLNESS:  Abigail Rice is a 57 y.o. female who was seen in consultation at the request of Carlean Jews, PA* for evaluation of thrombocytopenia   Reviewed patient's labs done previously.  Labs showed decreased platelet counts at 43,000, wbc 4.1 hemoglobin 10.7 Reviewed patient's previous labs. Thrombocytopenia is chronic chronic onset , since at least 2019.  Platelet count in 2019 was 1 43,000.  Starting August 2020, platelet count has been in the range of 50-60,000 No aggravating or elevated  factors.  Associated symptoms or signs:  Denies weight loss, fever, chills, fatigue, night sweats.  Denies hematochezia, hematuria, hematemesis, epistaxis, black tarry stool.  easy bruising.   Context:  History of hepatitis or HIV infection, denies  Patient has a history of heavy alcohol use.  She reports that she has cut down her alcohol intake since last year and currently she drinks 3-4 beer per day.  With a history of seizure and currently on Keppra.  #Thrombocytopenia increased to 80,000 after a course of dexamethasone 40 mg daily for 4 days. Platelet count further decreased to 38,000 a few weeks later.  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  Findings are favored to be reactive in nature.FISH results is normal.  INTERVAL HISTORY Abigail Rice is a 57 y.o. female who has above history reviewed by me today presents for follow up visit for management of thrombocytopenia. Intermittent blood in the stool. She took dexamethasone 40 mg x 4 days for last visit. She reports having upcoming dental extraction at the end of February 2024. She follows up with gastroenterology Dr. Suan Halter who recommends endoscopy.  She has not had endoscopy done due to not getting preprocedure labs done. She also noticed left lower extremity is bigger than right lower extremity, hardness.  No calf tenderness.  Review of Systems  Constitutional:  Negative for appetite change, chills, fatigue and fever.  HENT:   Negative for hearing loss and voice change.   Eyes:  Negative for eye problems.  Respiratory:  Negative for chest tightness and cough.   Cardiovascular:  Negative for chest pain.  Gastrointestinal:  Negative for abdominal distention, abdominal pain and blood in stool.  Intermittent blood in the stool.  Endocrine: Negative for hot flashes.  Genitourinary:  Negative for difficulty urinating and frequency.    Musculoskeletal:  Negative for arthralgias.  Skin:  Negative for itching and rash.  Neurological:  Negative for extremity weakness.  Hematological:  Negative for adenopathy.  Psychiatric/Behavioral:  Negative for confusion.     MEDICAL HISTORY:  Past Medical History:  Diagnosis Date   Bipolar disorder (HCC)    Depression    Diabetes mellitus without complication (HCC)    History of seizure 2020   history of 1 seizure   Hyperlipidemia    Hypertension     SURGICAL HISTORY: Past Surgical History:  Procedure Laterality Date   CESAREAN SECTION     COLONOSCOPY WITH PROPOFOL N/A 03/29/2016   Procedure: COLONOSCOPY WITH PROPOFOL;  Surgeon: Elnita Maxwell, MD;  Location: [facility] ENDOSCOPY;  Service: Endoscopy;  Laterality: N/A;   ESOPHAGOGASTRODUODENOSCOPY (EGD) WITH PROPOFOL N/A 09/20/2021   Procedure: ESOPHAGOGASTRODUODENOSCOPY (EGD) WITH PROPOFOL;  Surgeon: Wyline Mood, MD;  Location: [facility] ENDOSCOPY;  Service: Gastroenterology;  Laterality: N/A;   TUBAL LIGATION      SOCIAL HISTORY: Social History   Socioeconomic History   Marital status: Married    Spouse name: Not on file   Number of children: Not on file   Years of education: Not on file   Highest education level: Not on file  Occupational History   Not on file  Tobacco Use   Smoking status: Every Day    Current packs/day: 0.50    Average packs/day: 0.5 packs/day for 25.0 years (12.5 ttl pk-yrs)    Types: Cigarettes   Smokeless tobacco: Never   Tobacco comments:    6 -7  cigarettes   Vaping Use   Vaping status: Never Used  Substance and Sexual Activity   Alcohol use: Yes    Alcohol/week: 2.0 standard drinks of alcohol    Types: 2 Cans of beer per week    Comment: daily    Drug use: No   Sexual activity: Not on file  Other Topics Concern   Not on file  Social History Narrative   Not on file   Social Determinants of Health   Financial Resource Strain: Not on file  Food Insecurity: Not on file   Transportation Needs: Not on file  Physical Activity: Not on file  Stress: Not on file  Social Connections: Not on file  Intimate Partner Violence: Not on file    FAMILY HISTORY: Family History  Problem Relation Age of Onset   Diabetes Mother    Hypertension Mother    Breast cancer Sister     ALLERGIES:  is allergic to ace inhibitors, ciprofloxacin, lisinopril, risperdal [risperidone], vytorin [ezetimibe-simvastatin], and macrobid [nitrofurantoin].  MEDICATIONS:  Current Outpatient Medications  Medication Sig Dispense Refill   amLODipine (NORVASC) 5 MG tablet TAKE 1 TABLET (5 MG TOTAL) BY MOUTH DAILY. 90 tablet 1   Apple Cider Vinegar 188 MG CAPS Take 1 capsule by mouth daily.     cholecalciferol (VITAMIN D3) 25 MCG (1000 UNIT) tablet Take 1,000 Units by mouth daily.     Insulin Lispro Prot & Lispro (HUMALOG MIX 75/25 KWIKPEN) (75-25) 100 UNIT/ML Kwikpen INJECT 14 UNITS INTO THE SKIN 2 (TWO) TIMES DAILY WITH A MEAL. 15 mL 11   Insulin Pen Needle (BD PEN NEEDLE NANO 2ND GEN) 32G X 4 MM MISC USE AS DIRECTED 4 times a day with insulin pens 400 each 3   levETIRAcetam (KEPPRA) 500 MG tablet TAKE  1 TABLET BY MOUTH TWICE A DAY (Patient taking differently: Take 250 mg by mouth daily.) 180 tablet 1   levothyroxine (SYNTHROID) 50 MCG tablet Take 1 tablet (50 mcg total) by mouth daily before breakfast. 90 tablet 1   losartan (COZAAR) 50 MG tablet Take 1 tablet (50 mg total) by mouth daily. 90 tablet 3   rOPINIRole (REQUIP) 0.25 MG tablet TAKE 1-2 TABLETS BY MOUTH AT BEDTIME AS NEEDED 180 tablet 1   insulin lispro (HUMALOG) 100 UNIT/ML KwikPen Inject into the skin. Use at lunch with sliding scale may use up to 10 units [place] (Patient not taking: Reported on 08/01/2023) 3 mL 11   No current facility-administered medications for this visit.     PHYSICAL EXAMINATION: ECOG PERFORMANCE STATUS: 1 - Symptomatic but completely ambulatory Vitals:   08/01/23 1037  BP: 114/71  Pulse: 69  Resp: 18   Temp: 98.4 F (36.9 C)   Filed Weights   08/01/23 1037  Weight: 162 lb 3.2 oz (73.6 kg)    Physical Exam Constitutional:      General: She is not in acute distress. HENT:     Head: Normocephalic and atraumatic.  Eyes:     General: No scleral icterus. Cardiovascular:     Rate and Rhythm: Normal rate and regular rhythm.     Heart sounds: Normal heart sounds.  Pulmonary:     Effort: Pulmonary effort is normal. No respiratory distress.     Breath sounds: No wheezing.  Abdominal:     General: Bowel sounds are normal. There is no distension.     Palpations: Abdomen is soft.  Musculoskeletal:        General: No deformity. Normal range of motion.     Cervical back: Normal range of motion and neck supple.     Left lower leg: Edema present.  Skin:    General: Skin is warm and dry.     Findings: No erythema or rash.  Neurological:     Mental Status: She is alert and oriented to person, place, and time. Mental status is at baseline.     Cranial Nerves: No cranial nerve deficit.     Coordination: Coordination normal.  Psychiatric:        Mood and Affect: Mood normal.      LABORATORY DATA:  I have reviewed the data as listed    Latest Ref Rng & Units 08/01/2023   10:18 AM 01/31/2023   11:45 AM 08/01/2022    2:09 PM  CBC  WBC 4.0 - 10.5 K/uL 4.6  5.7  5.9   Hemoglobin 12.0 - 15.0 g/dL 16.1  09.6  04.5   Hematocrit 36.0 - 46.0 % 30.6  31.4  31.4   Platelets 150 - 400 K/uL 33  70  42       Latest Ref Rng & Units 08/01/2023   10:18 AM 01/31/2023   11:45 AM 08/01/2022    2:09 PM  CMP  Glucose 70 - 99 mg/dL 409  811  914   BUN 6 - 20 mg/dL 16  10  14    Creatinine 0.44 - 1.00 mg/dL 7.82  9.56  2.13   Sodium 135 - 145 mmol/L 133  130  134   Potassium 3.5 - 5.1 mmol/L 3.9  3.6  3.7   Chloride 98 - 111 mmol/L 107  100  105   CO2 22 - 32 mmol/L 20  21  24    Calcium 8.9 - 10.3 mg/dL 8.4  8.2  8.5   Total Protein 6.5 - 8.1 g/dL 7.0  7.8  7.2   Total Bilirubin 0.3 - 1.2 mg/dL 0.9   0.7  0.5   Alkaline Phos 38 - 126 U/L 205  142  214   AST 15 - 41 U/L 96  56  67   ALT 0 - 44 U/L 82  41  65      Iron/TIBC/Ferritin/ %Sat    Component Value Date/Time   IRON 156 01/17/2021 1120   TIBC 206 (L) 01/17/2021 1120   FERRITIN 595 (H) 01/17/2021 1120   IRONPCTSAT 76 (H) 01/17/2021 1120      RADIOGRAPHIC STUDIES: I have personally reviewed the radiological images as listed and agreed with the findings in the report.    12/13/2020, ultrasound abdomen limited-images are not available to me Impression, mildly increased echogenicity of the liver.  Likely fatty infiltration.  Liver unremarkable contour, echotexture.  Normal size of liver and spleen.  There are innumerable tiny scattered calcifications throughout the spleen.

## 2023-08-01 NOTE — Assessment & Plan Note (Signed)
Recommend patient to follow-up with gastroenterology Dr. Vicente Males.   It was recommended to repeat endoscopy after repeating blood work. Patient prefers to defer for now due to her dental issue.  She plans to call GI office later.

## 2023-08-01 NOTE — Assessment & Plan Note (Signed)
Alkaline phosphatase fractionated showed increase of bone fraction.  Recommend skeletal survey.  She declined.

## 2023-08-01 NOTE — Assessment & Plan Note (Addendum)
#  Thrombocytopenia possible due to peripheral destruction-possible ITP, chronic liver disease/alcohol consumption.  Previously platelet count improved after 4 days course of dexamethasone. Labs reviewed and discussed with patient. Platelet count 33,000. Given that she is going to have repeat colonoscopy, recommend patient to take a course of dexamethasone 40 mg daily for 4 days.

## 2023-08-01 NOTE — Progress Notes (Unsigned)
Pt here for follow up. Pt reports that she has lump in right arm, has been there for approx 7 months. Denies pain but has gotten a little bigger.

## 2023-08-01 NOTE — Assessment & Plan Note (Signed)
Probably lipoma. She declined [place] for further evaluation.  Monitor

## 2023-08-01 NOTE — Assessment & Plan Note (Signed)
Hemoglobin stable.  Continue observation.

## 2023-08-04 ENCOUNTER — Telehealth: Payer: Self-pay

## 2023-08-04 ENCOUNTER — Other Ambulatory Visit: Payer: Self-pay

## 2023-08-04 DIAGNOSIS — R7989 Other specified abnormal findings of blood chemistry: Secondary | ICD-10-CM

## 2023-08-04 DIAGNOSIS — K7030 Alcoholic cirrhosis of liver without ascites: Secondary | ICD-10-CM

## 2023-08-04 MED ORDER — NA SULFATE-K SULFATE-MG SULF 17.5-3.13-1.6 GM/177ML PO SOLN: 354.0000 mL | Freq: Once | ORAL | 0 refills | Status: AC

## 2023-08-04 MED ORDER — DEXAMETHASONE 4 MG PO TABS: 40.0000 mg | ORAL_TABLET | Freq: Every day | ORAL | 0 refills | Status: DC

## 2023-08-04 NOTE — Telephone Encounter (Signed)
Called patient back and she decided on having her colonoscopy done on 09/11/2023. Patient stated that she would want her instructions via MyChart and mail. I told her that I would do that for her.

## 2023-08-04 NOTE — Telephone Encounter (Signed)
She is ready to schedule her colonoscopy  but needs at least 3 weeks out

## 2023-08-26 ENCOUNTER — Other Ambulatory Visit: Payer: Self-pay

## 2023-08-26 ENCOUNTER — Telehealth: Payer: Self-pay

## 2023-08-26 MED ORDER — PNEUMOCOCCAL 20-VAL CONJ VACC 0.5 ML IM SUSY: 0.5000 mL | PREFILLED_SYRINGE | INTRAMUSCULAR | 0 refills | Status: AC

## 2023-08-26 NOTE — Telephone Encounter (Signed)
Lmom  we sent Prevnar 20

## 2023-08-26 NOTE — Telephone Encounter (Signed)
She can do prevnar 20

## 2023-08-28 ENCOUNTER — Telehealth: Payer: Self-pay | Admitting: *Deleted

## 2023-08-28 ENCOUNTER — Telehealth: Payer: Self-pay

## 2023-08-28 NOTE — Telephone Encounter (Signed)
Patient called and wants Dr Cathie Hoops to know that she has a colonoscopy scheduled with Dr Tobi Bastos on 09/11/23

## 2023-08-28 NOTE — Telephone Encounter (Signed)
Called patient to let her know that I would inform Dr. Cathie Hoops if she needed to order her labs before she had her procedure and she stated that she had reached out to Dr. Bethanne Ginger office and asked them the same question but have not heard back from them. I told her that if she did not hear back from them by next Monday 09/01/2023, to call me and let me know so I could also reach out to ask. Patient agreed.

## 2023-08-28 NOTE — Telephone Encounter (Signed)
Patient left a message that she is supposed to have a colonoscopy done with Dr. Tobi Bastos on 09/10/2023 and she wants Dr. Tobi Bastos to let Dr. Cathie Hoops know this information to make sure she does not need to do anything before the procedure is done

## 2023-09-08 NOTE — Telephone Encounter (Signed)
Can you follow up with pt again please. In case she does need a transfusion before colonoscopy.

## 2023-09-09 ENCOUNTER — Telehealth: Payer: Self-pay

## 2023-09-09 NOTE — Telephone Encounter (Signed)
Called patient and left a detail message if she wanted to reschedule please to call our office to reschedule. Called ENDO and cancel the procedure with Verlon Au.

## 2023-09-09 NOTE — Telephone Encounter (Signed)
Pt called to cancel procedure/colonoscopy

## 2023-09-09 NOTE — Telephone Encounter (Signed)
error 

## 2023-09-11 ENCOUNTER — Encounter: Admission: RE | Payer: Self-pay | Source: Home / Self Care

## 2023-09-11 ENCOUNTER — Ambulatory Visit
Admission: RE | Admit: 2023-09-11 | Payer: BC Managed Care – PPO | Source: Home / Self Care | Admitting: Gastroenterology

## 2023-09-11 SURGERY — COLONOSCOPY WITH PROPOFOL
Anesthesia: General

## 2023-09-12 ENCOUNTER — Other Ambulatory Visit: Payer: Self-pay | Admitting: Internal Medicine

## 2023-09-12 ENCOUNTER — Telehealth: Payer: Self-pay | Admitting: Physician Assistant

## 2023-09-12 NOTE — Telephone Encounter (Signed)
Patient called Friday evening stating since she got her shot, her legs are swollen and painful. I notified her per provider on call to either go to urgent care or ED-Toni

## 2023-09-12 NOTE — Telephone Encounter (Signed)
Please review its said 14 units in med list

## 2023-09-15 ENCOUNTER — Ambulatory Visit (INDEPENDENT_AMBULATORY_CARE_PROVIDER_SITE_OTHER): Payer: BC Managed Care – PPO | Admitting: Nurse Practitioner

## 2023-09-15 ENCOUNTER — Encounter: Payer: Self-pay | Admitting: Nurse Practitioner

## 2023-09-15 ENCOUNTER — Telehealth: Payer: Self-pay | Admitting: Physician Assistant

## 2023-09-15 VITALS — BP 118/68 | HR 90 | Temp 98.1°F | Resp 16 | Ht 65.0 in | Wt 165.2 lb

## 2023-09-15 DIAGNOSIS — L512 Toxic epidermal necrolysis [Lyell]: Secondary | ICD-10-CM

## 2023-09-15 DIAGNOSIS — L271 Localized skin eruption due to drugs and medicaments taken internally: Secondary | ICD-10-CM

## 2023-09-15 NOTE — Telephone Encounter (Signed)
Lvm to schedule acute visit for swollen legs-Toni

## 2023-09-15 NOTE — Progress Notes (Signed)
[facility] [address] [place], [place] [address]  Internal MEDICINE  Office Visit Note  Patient Name: Abigail Rice  [id number]  [id number]  Date of Service: 09/15/2023  Chief Complaint  Patient presents with   Acute Visit    Had pneumonia shot now legs are swollen, burning and black.      HPI Abigail Rice presents for an acute sick visit for possible allergic reaction to prevnar 20.  Had pneumonia shot on Friday 09/05/23 After having the shot, started developing swelling of the legs, burning pain and  She is applying triple antibiotic topical OTC for scars, pain, also T-oil and calamine lotion And peroxide. Area is improving, patient prefers to continue with her home remedy but will call the clinic if the areas get any worse.  Does not look infected but has some black colored eschar-type scabbing   Current Medication:  Outpatient Encounter Medications as of 09/15/2023  Medication Sig   amLODipine (NORVASC) 5 MG tablet TAKE 1 TABLET (5 MG TOTAL) BY MOUTH DAILY.   Apple Cider Vinegar 188 MG CAPS Take 1 capsule by mouth daily.   cholecalciferol (VITAMIN D3) 25 MCG (1000 UNIT) tablet Take 1,000 Units by mouth daily.   dexamethasone (DECADRON) 4 MG tablet Take 10 tablets (40 mg total) by mouth daily.   insulin lispro (HUMALOG) 100 UNIT/ML KwikPen Inject into the skin. Use at lunch with sliding scale may use up to 10 units [place]   Insulin Lispro Prot & Lispro (HUMALOG MIX 75/25 KWIKPEN) (75-25) 100 UNIT/ML Kwikpen INJECT 14 UNITS INTO THE SKIN 2 (TWO) TIMES DAILY WITH A MEAL.   Insulin Pen Needle (BD PEN NEEDLE NANO 2ND GEN) 32G X 4 MM MISC USE AS DIRECTED 4 times a day with insulin pens   levETIRAcetam (KEPPRA) 500 MG tablet TAKE 1 TABLET BY MOUTH TWICE A DAY (Patient taking differently: Take 250 mg by mouth daily.)   levothyroxine (SYNTHROID) 50 MCG tablet Take 1 tablet (50 mcg total) by mouth daily before breakfast.   losartan (COZAAR) 50 MG tablet Take 1 tablet (50 mg total) by  mouth daily.   rOPINIRole (REQUIP) 0.25 MG tablet TAKE 1-2 TABLETS BY MOUTH AT BEDTIME AS NEEDED   No facility-administered encounter medications on file as of 09/15/2023.      Medical History: Past Medical History:  Diagnosis Date   Bipolar disorder (HCC)    Depression    Diabetes mellitus without complication (HCC)    History of seizure 2020   history of 1 seizure   Hyperlipidemia    Hypertension      Vital Signs: BP 118/68   Pulse 90   Temp 98.1 F (36.7 C)   Resp 16   Ht 5\' 5"  (1.651 m)   Wt 165 lb 3.2 oz (74.9 kg)   LMP 11/07/2015 (Approximate)   SpO2 99%   BMI 27.49 kg/m    Review of Systems  Skin:  Positive for wound (wounds to bilateral lower extremities with some generalized lower extremity swelling).    Physical Exam Vitals reviewed.  Constitutional:      General: She is not in acute distress.    Appearance: Normal appearance. She is obese. She is not ill-appearing.  HENT:     Head: Normocephalic and atraumatic.  Eyes:     Pupils: Pupils are equal, round, and reactive to light.  Cardiovascular:     Rate and Rhythm: Normal rate and regular rhythm.  Pulmonary:     Effort: Pulmonary effort is normal.  No respiratory distress.  Skin:    Findings: Wound present.     Comments: Superficial wounds on both lower legs with black-colored eschar-like scabs. No redness, purulent drainage or peri-wound swelling noted, no other signs of infection noted.   Neurological:     Mental Status: She is alert and oriented to person, place, and time.  Psychiatric:        Mood and Affect: Mood normal.        Behavior: Behavior normal.       Assessment/Plan: 1. Localized skin eruption due to drugs and medicaments taken internally Continue OTC interventions as discussed as long as they are helping and the areas continue to improve and resolve. If not, please call clinic so provider-guided treatment with or without possible prescribed medications can be initiated. May  require another office visit if the areas of concern have changed.   General Counseling: Abigail Rice verbalizes understanding of the findings of todays visit and agrees with plan of treatment. I have discussed any further diagnostic evaluation that may be needed or ordered today. We also reviewed her medications today. she has been encouraged to call the office with any questions or concerns that should arise related to todays visit.    Counseling:    No orders of the defined types were placed in this encounter.   No orders of the defined types were placed in this encounter.   Return if symptoms worsen or fail to improve.  [place] Controlled Substance Database was reviewed by me for overdose risk score (ORS)  Time spent:20 Minutes Time spent with patient included reviewing progress notes, labs, imaging studies, and discussing plan for follow up.   This patient was seen by Sallyanne Kuster, FNP-C in collaboration with Dr. Beverely Risen as a part of collaborative care agreement.  Fajr Fife R. Tedd Sias, MSN, FNP-C Internal Medicine

## 2023-09-17 ENCOUNTER — Telehealth: Payer: Self-pay

## 2023-09-17 MED ORDER — PREDNISONE 10 MG (48) PO TBPK: ORAL_TABLET | ORAL | 0 refills | Status: DC

## 2023-09-17 NOTE — Telephone Encounter (Signed)
Pt advised that we sent med if symptoms worse go to ED or urgent care

## 2023-09-19 ENCOUNTER — Telehealth: Payer: Self-pay | Admitting: Physician Assistant

## 2023-09-19 NOTE — Telephone Encounter (Signed)
Lvm & sent message to confirm 09/26/23 appointment-Abigail Rice

## 2023-09-26 ENCOUNTER — Ambulatory Visit (INDEPENDENT_AMBULATORY_CARE_PROVIDER_SITE_OTHER): Payer: BC Managed Care – PPO | Admitting: Physician Assistant

## 2023-09-26 ENCOUNTER — Encounter: Payer: Self-pay | Admitting: Physician Assistant

## 2023-09-26 VITALS — BP 124/77 | HR 79 | Temp 97.8°F | Resp 16 | Ht 65.0 in | Wt 170.0 lb

## 2023-09-26 DIAGNOSIS — E039 Hypothyroidism, unspecified: Secondary | ICD-10-CM | POA: Diagnosis not present

## 2023-09-26 DIAGNOSIS — Z794 Long term (current) use of insulin: Secondary | ICD-10-CM

## 2023-09-26 DIAGNOSIS — E1165 Type 2 diabetes mellitus with hyperglycemia: Secondary | ICD-10-CM

## 2023-09-26 DIAGNOSIS — I10 Essential (primary) hypertension: Secondary | ICD-10-CM

## 2023-09-26 DIAGNOSIS — Z0001 Encounter for general adult medical examination with abnormal findings: Secondary | ICD-10-CM | POA: Diagnosis not present

## 2023-09-26 DIAGNOSIS — R569 Unspecified convulsions: Secondary | ICD-10-CM

## 2023-09-26 DIAGNOSIS — G2581 Restless legs syndrome: Secondary | ICD-10-CM

## 2023-09-26 MED ORDER — AMLODIPINE BESYLATE 5 MG PO TABS: 5.0000 mg | ORAL_TABLET | Freq: Every day | ORAL | 1 refills | Status: DC

## 2023-09-26 MED ORDER — ROPINIROLE HCL 0.25 MG PO TABS
ORAL_TABLET | ORAL | 1 refills | Status: DC
Start: 2023-09-26 — End: 2024-04-01

## 2023-09-26 MED ORDER — INSULIN LISPRO PROT & LISPRO (75-25 MIX) 100 UNIT/ML KWIKPEN
PEN_INJECTOR | SUBCUTANEOUS | 11 refills | Status: DC
Start: 2023-09-26 — End: 2023-12-22

## 2023-09-26 MED ORDER — LOSARTAN POTASSIUM 50 MG PO TABS
50.0000 mg | ORAL_TABLET | Freq: Every day | ORAL | 3 refills | Status: DC
Start: 2023-09-26 — End: 2023-10-16

## 2023-09-26 MED ORDER — LEVETIRACETAM 500 MG PO TABS
500.0000 mg | ORAL_TABLET | Freq: Two times a day (BID) | ORAL | 1 refills | Status: DC
Start: 2023-09-26 — End: 2024-04-01

## 2023-09-26 MED ORDER — LEVOTHYROXINE SODIUM 50 MCG PO TABS
50.0000 ug | ORAL_TABLET | Freq: Every day | ORAL | 1 refills | Status: DC
Start: 2023-09-26 — End: 2024-04-01

## 2023-09-26 MED ORDER — BD PEN NEEDLE NANO 2ND GEN 32G X 4 MM MISC: 3 refills | Status: AC

## 2023-09-26 NOTE — Progress Notes (Signed)
[facility] [address] [place], [place] [address]  Internal MEDICINE  Office Visit Note  Patient Name: Abigail Rice  [id number]  [id number]  Date of Service: 10/01/2023  Chief Complaint  Patient presents with   Annual Exam   Diabetes     HPI Pt is here for routine health maintenance examination -Hasda severe reaction to PNA vaccine and was seen in office a few weeks ago by Arlyss Repress, FNP -was having leg swelling and rash. Doing better now. Still a little itchy as new skin healing. This was a very traumatic reaction for her and pt is tearful recalling experience -job is closing Oct 31 and therefore will be having insurance change. Would like all refills sent so she can try to fill these prior to insurance change  -Mammogram is next Thursday -will schedule eye exam -foot exam  Current Medication: Outpatient Encounter Medications as of 09/26/2023  Medication Sig   Apple Cider Vinegar 188 MG CAPS Take 1 capsule by mouth daily.   cholecalciferol (VITAMIN D3) 25 MCG (1000 UNIT) tablet Take 1,000 Units by mouth daily.   dexamethasone (DECADRON) 4 MG tablet Take 10 tablets (40 mg total) by mouth daily.   insulin lispro (HUMALOG) 100 UNIT/ML KwikPen Inject into the skin. Use at lunch with sliding scale may use up to 10 units [place]   predniSONE (STERAPRED UNI-PAK 48 TAB) 10 MG (48) TBPK tablet Use as directed   [DISCONTINUED] amLODipine (NORVASC) 5 MG tablet TAKE 1 TABLET (5 MG TOTAL) BY MOUTH DAILY.   [DISCONTINUED] Insulin Lispro Prot & Lispro (HUMALOG MIX 75/25 KWIKPEN) (75-25) 100 UNIT/ML Kwikpen INJECT 14 UNITS INTO THE SKIN 2 (TWO) TIMES DAILY WITH A MEAL.   [DISCONTINUED] Insulin Pen Needle (BD PEN NEEDLE NANO 2ND GEN) 32G X 4 MM MISC USE AS DIRECTED 4 times a day with insulin pens   [DISCONTINUED] levETIRAcetam (KEPPRA) 500 MG tablet TAKE 1 TABLET BY MOUTH TWICE A DAY (Patient taking differently: Take 250 mg by mouth daily.)   [DISCONTINUED] levothyroxine (SYNTHROID) 50  MCG tablet Take 1 tablet (50 mcg total) by mouth daily before breakfast.   [DISCONTINUED] losartan (COZAAR) 50 MG tablet Take 1 tablet (50 mg total) by mouth daily.   [DISCONTINUED] rOPINIRole (REQUIP) 0.25 MG tablet TAKE 1-2 TABLETS BY MOUTH AT BEDTIME AS NEEDED   amLODipine (NORVASC) 5 MG tablet Take 1 tablet (5 mg total) by mouth daily.   Insulin Lispro Prot & Lispro (HUMALOG MIX 75/25 KWIKPEN) (75-25) 100 UNIT/ML Kwikpen INJECT 14 UNITS INTO THE SKIN 2 (TWO) TIMES DAILY WITH A MEAL.   Insulin Pen Needle (BD PEN NEEDLE NANO 2ND GEN) 32G X 4 MM MISC USE AS DIRECTED 4 times a day with insulin pens   levETIRAcetam (KEPPRA) 500 MG tablet Take 1 tablet (500 mg total) by mouth 2 (two) times daily.   levothyroxine (SYNTHROID) 50 MCG tablet Take 1 tablet (50 mcg total) by mouth daily before breakfast.   losartan (COZAAR) 50 MG tablet Take 1 tablet (50 mg total) by mouth daily.   rOPINIRole (REQUIP) 0.25 MG tablet TAKE 1-2 TABLETS BY MOUTH AT BEDTIME AS NEEDED   No facility-administered encounter medications on file as of 09/26/2023.    Surgical History: Past Surgical History:  Procedure Laterality Date   CESAREAN SECTION     COLONOSCOPY WITH PROPOFOL N/A 03/29/2016   Procedure: COLONOSCOPY WITH PROPOFOL;  Surgeon: Elnita Maxwell, MD;  Location: [facility] ENDOSCOPY;  Service: Endoscopy;  Laterality: N/A;   ESOPHAGOGASTRODUODENOSCOPY (EGD) WITH PROPOFOL  N/A 09/20/2021   Procedure: ESOPHAGOGASTRODUODENOSCOPY (EGD) WITH PROPOFOL;  Surgeon: Wyline Mood, MD;  Location: [facility] ENDOSCOPY;  Service: Gastroenterology;  Laterality: N/A;   TUBAL LIGATION      Medical History: Past Medical History:  Diagnosis Date   Bipolar disorder (HCC)    Depression    Diabetes mellitus without complication (HCC)    History of seizure 2020   history of 1 seizure   Hyperlipidemia    Hypertension     Family History: Family History  Problem Relation Age of Onset   Diabetes Mother    Hypertension Mother    Breast  cancer Sister       Review of Systems  Constitutional:  Negative for chills, fatigue and unexpected weight change.  HENT:  Negative for congestion, postnasal drip, rhinorrhea, sneezing and sore throat.   Eyes:  Negative for redness.  Respiratory:  Negative for cough, chest tightness and shortness of breath.   Cardiovascular:  Negative for chest pain and palpitations.  Gastrointestinal:  Negative for abdominal pain, constipation, diarrhea, nausea and vomiting.  Genitourinary:  Negative for dysuria and frequency.  Musculoskeletal:  Negative for arthralgias, back pain, joint swelling and neck pain.  Skin:  Positive for rash.  Neurological: Negative.  Negative for tremors and numbness.  Hematological:  Negative for adenopathy. Does not bruise/bleed easily.  Psychiatric/Behavioral:  Negative for behavioral problems (Depression), sleep disturbance and suicidal ideas. The patient is not nervous/anxious.      Vital Signs: BP 124/77   Pulse 79   Temp 97.8 F (36.6 C)   Resp 16   Ht 5\' 5"  (1.651 m)   Wt 170 lb (77.1 kg)   LMP 11/07/2015 (Approximate)   SpO2 97%   BMI 28.29 kg/m    Physical Exam Vitals and nursing note reviewed.  Constitutional:      General: She is not in acute distress.    Appearance: She is well-developed. She is obese. She is not diaphoretic.  HENT:     Head: Normocephalic and atraumatic.     Mouth/Throat:     Pharynx: No oropharyngeal exudate.  Eyes:     Pupils: Pupils are equal, round, and reactive to light.  Neck:     Thyroid: No thyromegaly.     Vascular: No JVD.     Trachea: No tracheal deviation.  Cardiovascular:     Rate and Rhythm: Normal rate and regular rhythm.     Pulses:          Dorsalis pedis pulses are 3+ on the right side and 3+ on the left side.       Posterior tibial pulses are 3+ on the right side and 3+ on the left side.     Heart sounds: Normal heart sounds. No murmur heard.    No friction rub. No gallop.  Pulmonary:     Effort:  Pulmonary effort is normal. No respiratory distress.     Breath sounds: No wheezing or rales.  Chest:     Chest wall: No tenderness.  Abdominal:     General: Bowel sounds are normal.     Palpations: Abdomen is soft.     Tenderness: There is no abdominal tenderness.  Musculoskeletal:        General: Normal range of motion.     Cervical back: Normal range of motion and neck supple.     Right foot: Normal range of motion.     Left foot: Normal range of motion.  Feet:     Right  foot:     Protective Sensation: 2 sites tested.  2 sites sensed.     Skin integrity: Skin integrity normal.     Toenail Condition: Right toenails are normal.     Left foot:     Protective Sensation: 2 sites tested.  2 sites sensed.     Skin integrity: Callus present.     Toenail Condition: Left toenails are normal.  Lymphadenopathy:     Cervical: No cervical adenopathy.  Skin:    General: Skin is warm and dry.     Comments: Skin healing on legs after allergic reaction  Neurological:     Mental Status: She is alert and oriented to person, place, and time.     Cranial Nerves: No cranial nerve deficit.  Psychiatric:        Behavior: Behavior normal.        Thought Content: Thought content normal.        Judgment: Judgment normal.      LABS: Recent Results (from the past 2160 hour(s))  POCT HgB A1C     Status: None   Collection Time: 07/10/23 11:54 AM  Result Value Ref Range   Hemoglobin A1C 5.1 4.0 - 5.6 %   HbA1c POC (<> result, manual entry)     HbA1c, POC (prediabetic range)     HbA1c, POC (controlled diabetic range)    Protime-INR     Status: Abnormal   Collection Time: 08/01/23 10:18 AM  Result Value Ref Range   Prothrombin Time 18.5 (H) 11.4 - 15.2 seconds   INR 1.5 (H) 0.8 - 1.2    Comment: (NOTE) INR goal varies based on device and disease states. Performed at [facility], [address] [place], [place] [address]   AFP tumor marker     Status: None   Collection Time:  08/01/23 10:18 AM  Result Value Ref Range   AFP, Serum, Tumor Marker 5.0 0.0 - 9.2 ng/mL    Comment: (NOTE) Roche [employer]ostics Electrochemiluminescence Immunoassay (ECLIA) Values obtained with different assay methods or kits cannot be used interchangeably.  Results cannot be interpreted as absolute evidence of the presence or absence of malignant disease. This test is not interpretable in pregnant females. Performed At: [facility] [address] [place], [place] [id number] Jolene Schimke MD JJ:[id number]   Comprehensive metabolic panel     Status: Abnormal   Collection Time: 08/01/23 10:18 AM  Result Value Ref Range   Sodium 133 (L) 135 - 145 mmol/L   Potassium 3.9 3.5 - 5.1 mmol/L   Chloride 107 98 - 111 mmol/L   CO2 20 (L) 22 - 32 mmol/L   Glucose, Bld 120 (H) 70 - 99 mg/dL    Comment: Glucose reference range applies only to samples taken after fasting for at least 8 hours.   BUN 16 6 - 20 mg/dL   Creatinine, Ser 1.60 (H) 0.44 - 1.00 mg/dL   Calcium 8.4 (L) 8.9 - 10.3 mg/dL   Total Protein 7.0 6.5 - 8.1 g/dL   Albumin 2.9 (L) 3.5 - 5.0 g/dL   AST 96 (H) 15 - 41 U/L   ALT 82 (H) 0 - 44 U/L   Alkaline Phosphatase 205 (H) 38 - 126 U/L   Total Bilirubin 0.9 0.3 - 1.2 mg/dL   GFR, Estimated 53 (L) >60 mL/min    Comment: (NOTE) Calculated using the CKD-EPI Creatinine Equation (2021)    Anion gap 6 5 - 15    Comment: Performed at [facility], [address] [place], [place] [address]  CBC with Differential/Platelet     Status: Abnormal   Collection Time: 08/01/23 10:18 AM  Result Value Ref Range   WBC 4.6 4.0 - 10.5 K/uL   RBC 3.23 (L) 3.87 - 5.11 MIL/uL   Hemoglobin 10.2 (L) 12.0 - 15.0 g/dL   HCT 57.8 (L) 46.9 - 62.9 %   MCV 94.7 80.0 - 100.0 fL   MCH 31.6 26.0 - 34.0 pg   MCHC 33.3 30.0 - 36.0 g/dL   RDW 52.8 (H) 41.3 - 24.4 %   Platelets 33 (L) 150 - 400 K/uL    Comment: SPECIMEN CHECKED FOR CLOTS Immature Platelet Fraction may be clinically  indicated, consider ordering this additional test [id number]    nRBC 0.0 0.0 - 0.2 %   Neutrophils Relative % 27 %   Neutro Abs 1.2 (L) 1.7 - 7.7 K/uL   Lymphocytes Relative 65 %   Lymphs Abs 3.0 0.7 - 4.0 K/uL   Monocytes Relative 8 %   Monocytes Absolute 0.4 0.1 - 1.0 K/uL   Eosinophils Relative 0 %   Eosinophils Absolute 0.0 0.0 - 0.5 K/uL   Basophils Relative 0 %   Basophils Absolute 0.0 0.0 - 0.1 K/uL   Immature Granulocytes 0 %   Abs Immature Granulocytes 0.00 0.00 - 0.07 K/uL    Comment: Performed at [facility], [address] [place], [place] [address]  APTT     Status: None   Collection Time: 08/01/23 10:18 AM  Result Value Ref Range   aPTT 32 24 - 36 seconds    Comment: Performed at [facility], [address], [place], [place] [address]        Assessment/Plan: 1. Encounter for general adult medical examination with abnormal findings CPE performed, mammogram scheduled, will be rescheduling colonoscopy, due for eye exam  2. Essential hypertension Stable, continue current medications - amLODipine (NORVASC) 5 MG tablet; Take 1 tablet (5 mg total) by mouth daily.  Dispense: 90 tablet; Refill: 1 - losartan (COZAAR) 50 MG tablet; Take 1 tablet (50 mg total) by mouth daily.  Dispense: 90 tablet; Refill: 3  3. Type 2 diabetes mellitus with hyperglycemia, with long-term current use of insulin (HCC) Will send refills - Insulin Lispro Prot & Lispro (HUMALOG MIX 75/25 KWIKPEN) (75-25) 100 UNIT/ML Kwikpen; INJECT 14 UNITS INTO THE SKIN 2 (TWO) TIMES DAILY WITH A MEAL.  Dispense: 15 mL; Refill: 11 - Insulin Pen Needle (BD PEN NEEDLE NANO 2ND GEN) 32G X 4 MM MISC; USE AS DIRECTED 4 times a day with insulin pens  Dispense: 400 each; Refill: 3  4. Acquired hypothyroidism - levothyroxine (SYNTHROID) 50 MCG tablet; Take 1 tablet (50 mcg total) by mouth daily before breakfast.  Dispense: 90 tablet; Refill: 1  5. Seizure (HCC) - levETIRAcetam (KEPPRA) 500 MG  tablet; Take 1 tablet (500 mg total) by mouth 2 (two) times daily.  Dispense: 180 tablet; Refill: 1  6. Restless legs - rOPINIRole (REQUIP) 0.25 MG tablet; TAKE 1-2 TABLETS BY MOUTH AT BEDTIME AS NEEDED  Dispense: 180 tablet; Refill: 1   General Counseling: Latonja verbalizes understanding of the findings of todays visit and agrees with plan of treatment. I have discussed any further diagnostic evaluation that may be needed or ordered today. We also reviewed her medications today. she has been encouraged to call the office with any questions or concerns that should arise related to todays visit.    Counseling:  No orders of the defined types were placed in this encounter.   Meds ordered this encounter  Medications   amLODipine (NORVASC) 5 MG tablet    Sig: Take 1 tablet (5 mg total) by mouth daily.    Dispense:  90 tablet    Refill:  1   Insulin Lispro Prot & Lispro (HUMALOG MIX 75/25 KWIKPEN) (75-25) 100 UNIT/ML Kwikpen    Sig: INJECT 14 UNITS INTO THE SKIN 2 (TWO) TIMES DAILY WITH A MEAL.    Dispense:  15 mL    Refill:  11   Insulin Pen Needle (BD PEN NEEDLE NANO 2ND GEN) 32G X 4 MM MISC    Sig: USE AS DIRECTED 4 times a day with insulin pens    Dispense:  400 each    Refill:  3   levothyroxine (SYNTHROID) 50 MCG tablet    Sig: Take 1 tablet (50 mcg total) by mouth daily before breakfast.    Dispense:  90 tablet    Refill:  1   levETIRAcetam (KEPPRA) 500 MG tablet    Sig: Take 1 tablet (500 mg total) by mouth 2 (two) times daily.    Dispense:  180 tablet    Refill:  1   losartan (COZAAR) 50 MG tablet    Sig: Take 1 tablet (50 mg total) by mouth daily.    Dispense:  90 tablet    Refill:  3   rOPINIRole (REQUIP) 0.25 MG tablet    Sig: TAKE 1-2 TABLETS BY MOUTH AT BEDTIME AS NEEDED    Dispense:  180 tablet    Refill:  1    This patient was seen by Lynn Ito, PA-C in collaboration with Dr. Beverely Risen as a part of collaborative care agreement.  Total time  spent:35 Minutes  Time spent includes review of chart, medications, test results, and follow up plan with the patient.     Lyndon Code, MD  Internal Medicine

## 2023-10-01 ENCOUNTER — Telehealth: Payer: Self-pay

## 2023-10-01 MED ORDER — HYDROCHLOROTHIAZIDE 12.5 MG PO CAPS: 12.5000 mg | ORAL_CAPSULE | Freq: Every day | ORAL | 0 refills | Status: DC | PRN

## 2023-10-01 NOTE — Telephone Encounter (Signed)
Pt advised we sent med if swelling not going down need appt

## 2023-10-02 DIAGNOSIS — Z1231 Encounter for screening mammogram for malignant neoplasm of breast: Secondary | ICD-10-CM | POA: Diagnosis not present

## 2023-10-06 ENCOUNTER — Telehealth: Payer: Self-pay | Admitting: Physician Assistant

## 2023-10-06 NOTE — Telephone Encounter (Signed)
Received request for additional mammogram views from [facility]. Gave to [employer]

## 2023-10-09 ENCOUNTER — Telehealth: Payer: Self-pay | Admitting: Physician Assistant

## 2023-10-09 ENCOUNTER — Other Ambulatory Visit: Payer: Self-pay | Admitting: Physician Assistant

## 2023-10-09 DIAGNOSIS — Z794 Long term (current) use of insulin: Secondary | ICD-10-CM

## 2023-10-09 NOTE — Telephone Encounter (Signed)
request for additional mammogram views signed. Faxed back to [facility] [phone]. Scanned-Toni

## 2023-10-10 NOTE — Telephone Encounter (Signed)
Please review and send alternative

## 2023-10-12 ENCOUNTER — Other Ambulatory Visit: Payer: Self-pay | Admitting: Physician Assistant

## 2023-10-12 DIAGNOSIS — I10 Essential (primary) hypertension: Secondary | ICD-10-CM

## 2023-10-13 ENCOUNTER — Telehealth: Payer: Self-pay | Admitting: Physician Assistant

## 2023-10-13 NOTE — Telephone Encounter (Signed)
Patient called stating she needs additional mammogram views. I told her order was sent to [facility] in [place] 10/09/23-Toni

## 2023-10-14 ENCOUNTER — Telehealth: Payer: Self-pay | Admitting: Physician Assistant

## 2023-10-14 NOTE — Telephone Encounter (Signed)
Mammogram order refaxed to [facility]

## 2023-10-15 ENCOUNTER — Other Ambulatory Visit: Payer: Self-pay | Admitting: Physician Assistant

## 2023-10-15 DIAGNOSIS — I10 Essential (primary) hypertension: Secondary | ICD-10-CM

## 2023-10-16 ENCOUNTER — Telehealth: Payer: Self-pay | Admitting: Physician Assistant

## 2023-10-16 ENCOUNTER — Other Ambulatory Visit: Payer: Self-pay

## 2023-10-16 DIAGNOSIS — I10 Essential (primary) hypertension: Secondary | ICD-10-CM

## 2023-10-16 MED ORDER — LOSARTAN POTASSIUM 50 MG PO TABS: 50.0000 mg | ORAL_TABLET | Freq: Every day | ORAL | 3 refills | Status: DC

## 2023-10-16 NOTE — Telephone Encounter (Addendum)
Received call from patient stating [facility] stating they still have not received order. I lvm for Sheri with imaging to return my call. Mammogram order emailed to here; [email or web address], notified patient-Abigail Rice

## 2023-10-17 ENCOUNTER — Other Ambulatory Visit: Payer: Self-pay | Admitting: Nurse Practitioner

## 2023-10-23 ENCOUNTER — Other Ambulatory Visit: Payer: Self-pay

## 2023-10-23 ENCOUNTER — Other Ambulatory Visit: Payer: Self-pay | Admitting: Physician Assistant

## 2023-10-23 DIAGNOSIS — E1165 Type 2 diabetes mellitus with hyperglycemia: Secondary | ICD-10-CM

## 2023-10-23 MED ORDER — INSULIN LISPRO (1 UNIT DIAL) 100 UNIT/ML (KWIKPEN): PEN_INJECTOR | SUBCUTANEOUS | 11 refills | Status: DC

## 2023-10-24 DIAGNOSIS — R928 Other abnormal and inconclusive findings on diagnostic imaging of breast: Secondary | ICD-10-CM | POA: Diagnosis not present

## 2023-10-24 DIAGNOSIS — R921 Mammographic calcification found on diagnostic imaging of breast: Secondary | ICD-10-CM | POA: Diagnosis not present

## 2023-10-25 IMAGING — US US ABDOMEN LIMITED
1 series · 14 of 25 positions shown · non-contrast
Comparison: Ultrasound dated 08/09/2021.

CLINICAL DATA: Cirrhosis.

EXAM:
ULTRASOUND ABDOMEN LIMITED RIGHT UPPER QUADRANT

[Series 1: us abdomen limited · 0.18mm/px · 14 of 57 slices shown]
[im 1/57]
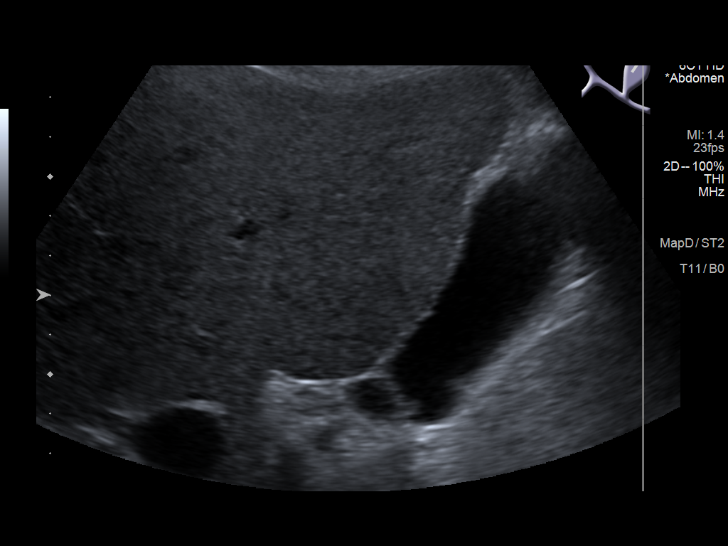
[im 5/57]
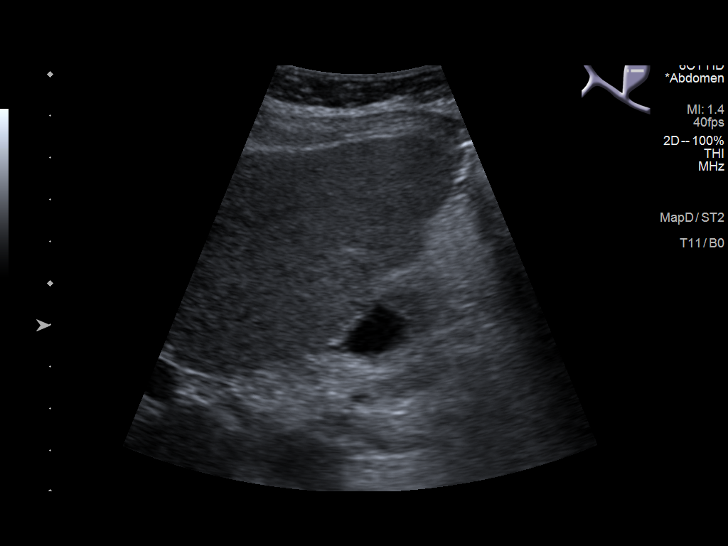
[im 10/57]
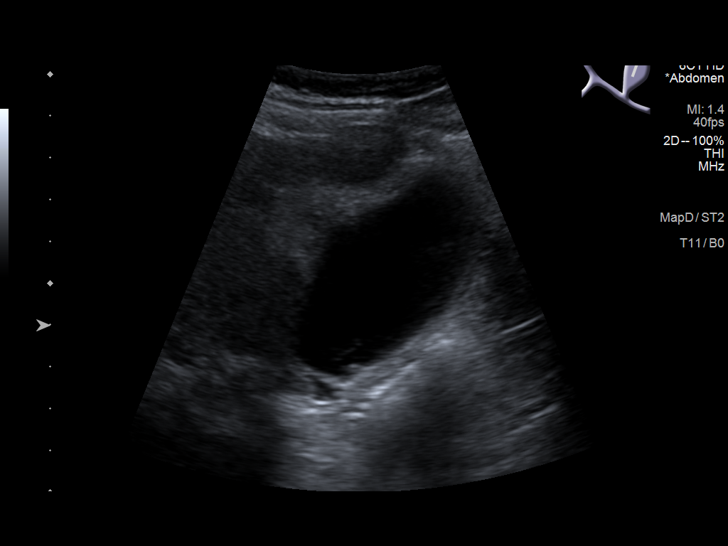
[im 15/57]
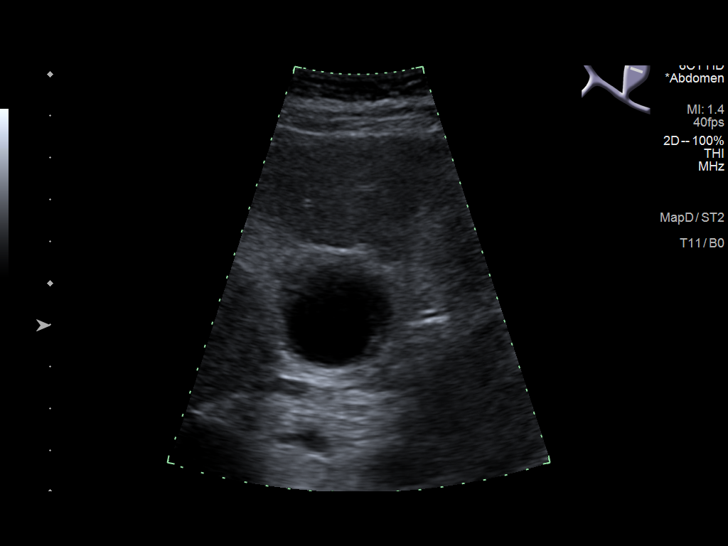
[im 19/57]
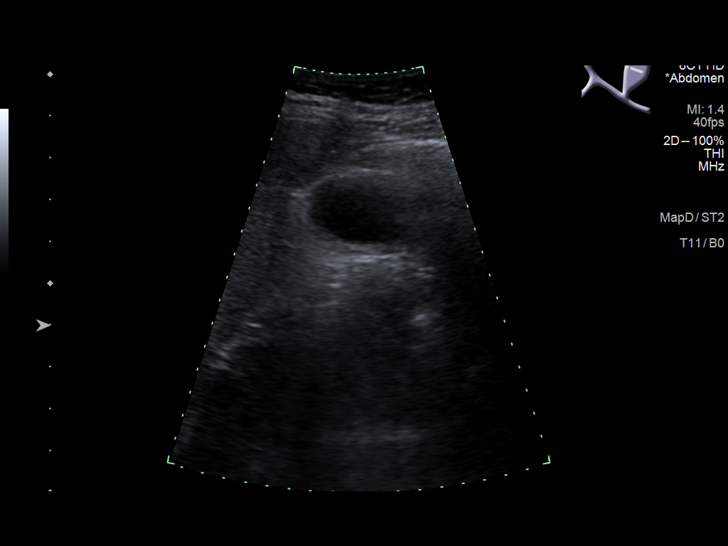
[im 22/57]
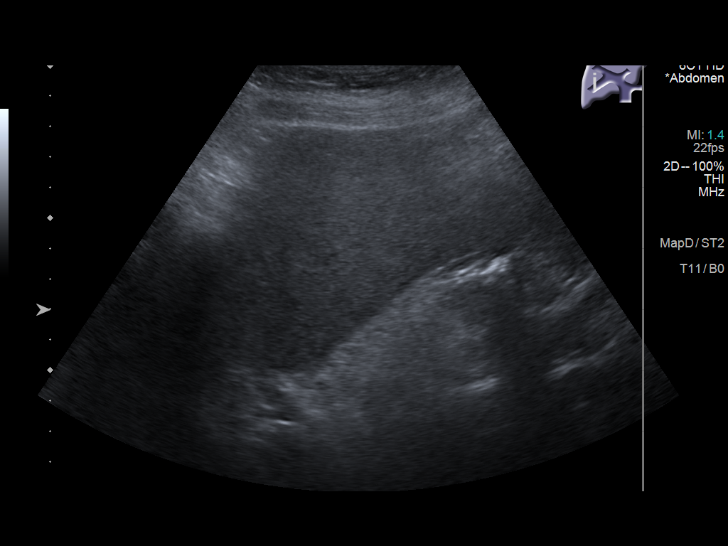
[im 26/57]
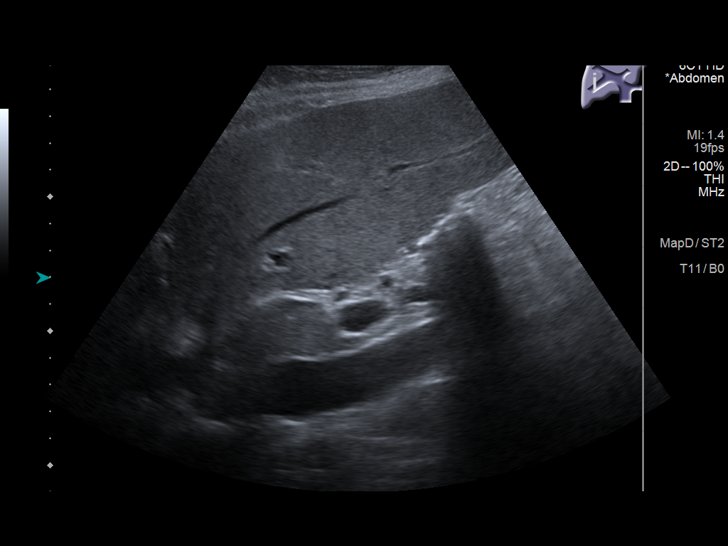
[im 31/57]
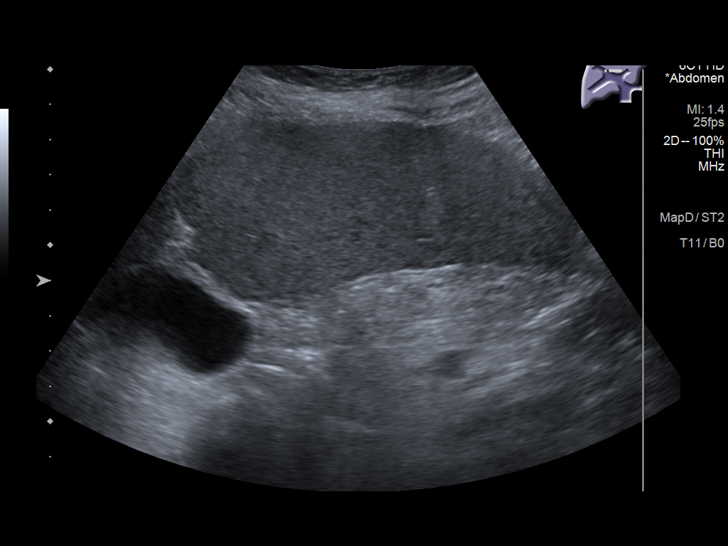
[im 36/57]
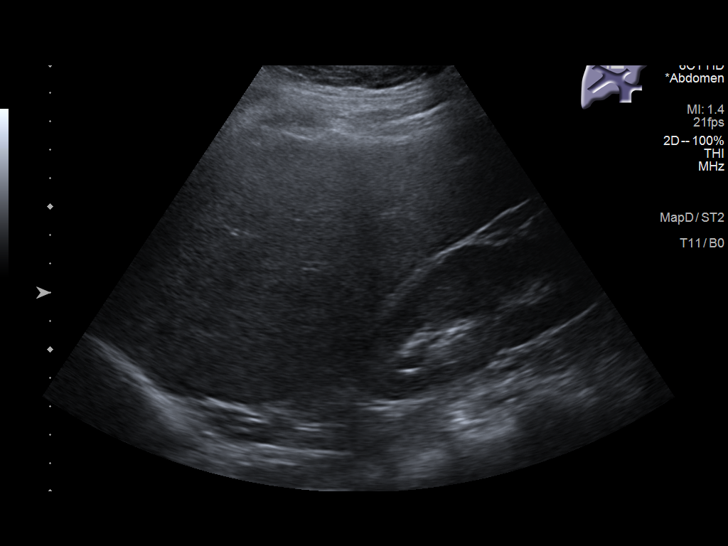
[im 38/57]
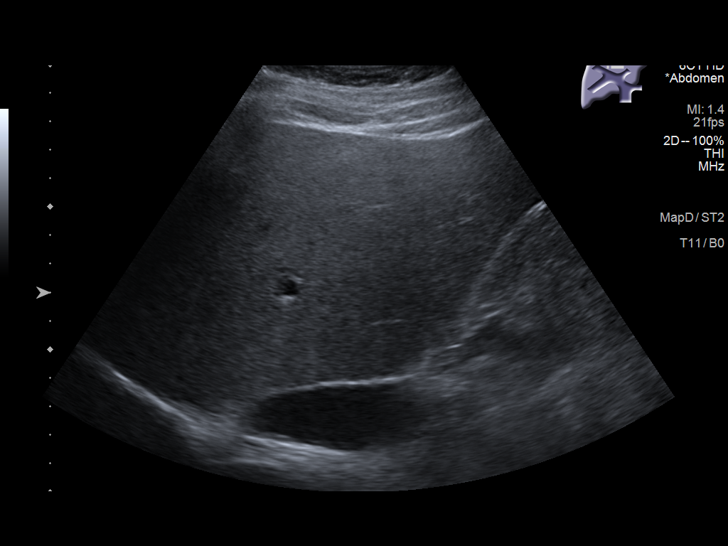
[im 43/57]
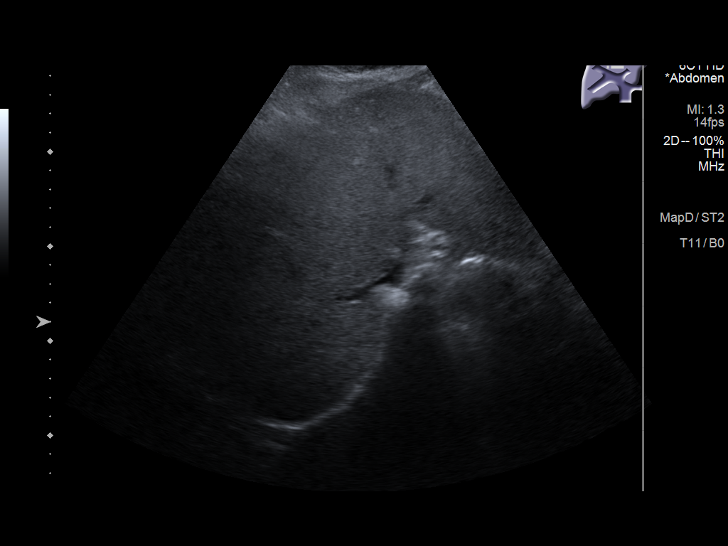
[im 47/57]
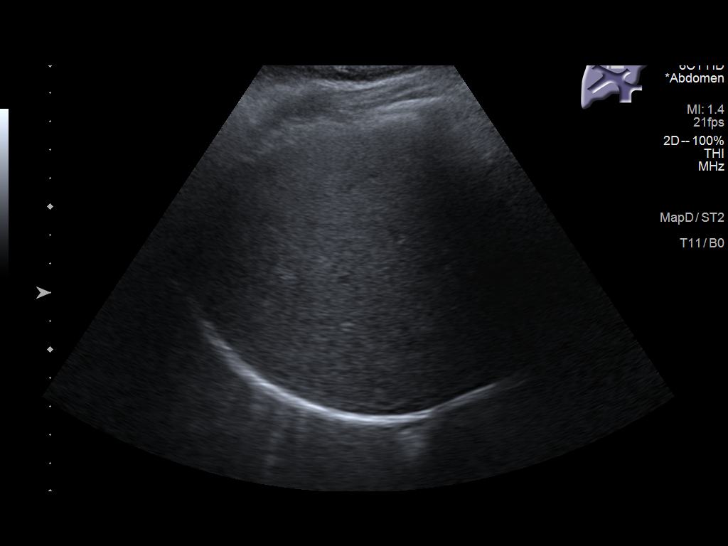
[im 52/57]
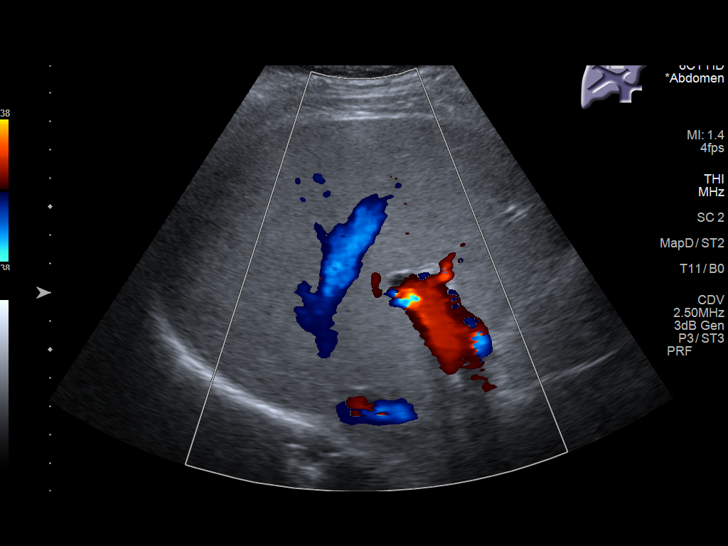
[im 57/57]
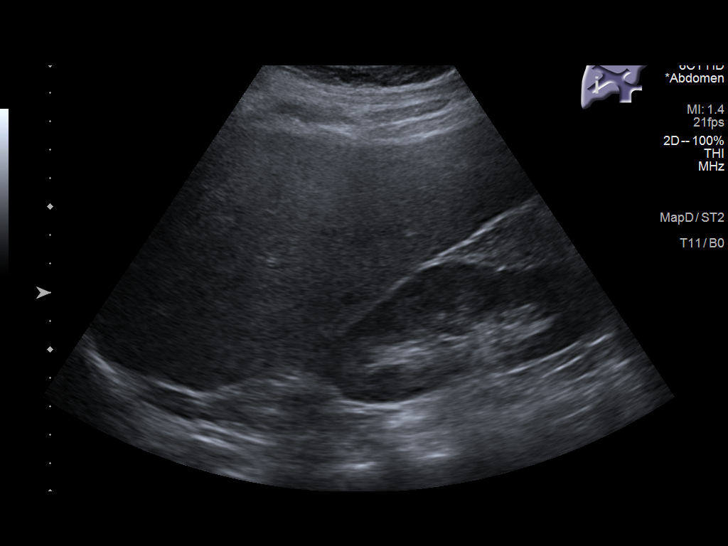

[14 of 25 positions shown; findings below may reference images not displayed]

FINDINGS: Gallbladder:

No gallstones or wall thickening visualized. No sonographic Murphy
sign noted by sonographer.

Common bile duct:

Diameter: 5 mm

Liver:

The liver demonstrates increased echogenicity with slightly
coarsened echotexture and mild surface irregularity in keeping with
provided history of cirrhosis. No discrete mass. Portal vein is
patent on color Doppler imaging with normal direction of blood flow
towards the liver.

Other: None.
IMPRESSION: 1. Cirrhosis.
2. Patent main portal vein with hepatopetal flow.
3. No ascites.

## 2023-11-08 ENCOUNTER — Encounter: Payer: Self-pay | Admitting: Nurse Practitioner

## 2023-11-19 ENCOUNTER — Other Ambulatory Visit: Payer: Self-pay | Admitting: Physician Assistant

## 2023-11-19 DIAGNOSIS — E1165 Type 2 diabetes mellitus with hyperglycemia: Secondary | ICD-10-CM

## 2023-11-22 ENCOUNTER — Other Ambulatory Visit: Payer: Self-pay | Admitting: Physician Assistant

## 2023-12-22 ENCOUNTER — Other Ambulatory Visit: Payer: Self-pay

## 2023-12-22 DIAGNOSIS — E1165 Type 2 diabetes mellitus with hyperglycemia: Secondary | ICD-10-CM

## 2023-12-22 MED ORDER — INSULIN LISPRO PROT & LISPRO (75-25 MIX) 100 UNIT/ML KWIKPEN: PEN_INJECTOR | SUBCUTANEOUS | 11 refills | Status: DC

## 2024-01-01 ENCOUNTER — Ambulatory Visit (INDEPENDENT_AMBULATORY_CARE_PROVIDER_SITE_OTHER): Payer: Medicaid Other | Admitting: Physician Assistant

## 2024-01-01 ENCOUNTER — Encounter: Payer: Self-pay | Admitting: Physician Assistant

## 2024-01-01 VITALS — BP 110/70 | HR 85 | Temp 97.6°F | Resp 16 | Ht 65.0 in | Wt 160.0 lb

## 2024-01-01 DIAGNOSIS — E1165 Type 2 diabetes mellitus with hyperglycemia: Secondary | ICD-10-CM

## 2024-01-01 DIAGNOSIS — I10 Essential (primary) hypertension: Secondary | ICD-10-CM

## 2024-01-01 DIAGNOSIS — Z794 Long term (current) use of insulin: Secondary | ICD-10-CM | POA: Diagnosis not present

## 2024-01-01 LAB — POCT GLYCOSYLATED HEMOGLOBIN (HGB A1C): Hemoglobin A1C: 4.8 % (ref 4.0–5.6)

## 2024-01-01 NOTE — Progress Notes (Signed)
 [facility] [address] [place], [place] [address]  Internal MEDICINE  Office Visit Note  Patient Name: Abigail Rice  [id number]  [id number]  Date of Service: 01/01/2024  Chief Complaint  Patient presents with   Follow-up   Depression   Diabetes   Hypertension   Hyperlipidemia   Medication Management    Needs insulin  increased   Quality Metric Gaps    Eye Exam, Shingles Vaccines    HPI Pt is here for routine follow up -Lost job when it closed on Halloween. Still job hunting. -sugar way down since job stopped, and with A1c so low will decrease insulin , may try just once per day and see how this does. -states job loss may have benefited her health as BP and BG doing so well now -needs to get eye exam scheduled -Had been having some gas and GI upset, but started yogurt and this has helped. -has not been back with GI recently and will see about follow up  Current Medication: Outpatient Encounter Medications as of 01/01/2024  Medication Sig   amLODipine  (NORVASC ) 5 MG tablet Take 1 tablet (5 mg total) by mouth daily.   Apple Cider Vinegar 188 MG CAPS Take 1 capsule by mouth daily.   cholecalciferol (VITAMIN D3) 25 MCG (1000 UNIT) tablet Take 1,000 Units by mouth daily.   Insulin  Lispro Prot & Lispro (HUMALOG  MIX 75/25 KWIKPEN) (75-25) 100 UNIT/ML Kwikpen INJECT 14 UNITS INTO THE SKIN 2 (TWO) TIMES DAILY WITH A MEAL.   Insulin  Pen Needle (BD PEN NEEDLE NANO 2ND GEN) 32G X 4 MM MISC USE AS DIRECTED 4 times a day with insulin  pens   levETIRAcetam  (KEPPRA ) 500 MG tablet Take 1 tablet (500 mg total) by mouth 2 (two) times daily.   levothyroxine  (SYNTHROID ) 50 MCG tablet Take 1 tablet (50 mcg total) by mouth daily before breakfast.   losartan  (COZAAR ) 50 MG tablet Take 1 tablet (50 mg total) by mouth daily.   rOPINIRole  (REQUIP ) 0.25 MG tablet TAKE 1-2 TABLETS BY MOUTH AT BEDTIME AS NEEDED   [DISCONTINUED] hydrochlorothiazide  (MICROZIDE ) 12.5 MG capsule TAKE 1 CAPSULE  (12.5 MG TOTAL) BY MOUTH DAILY AS NEEDED FOR SWELLING OF LEGS   [DISCONTINUED] insulin  lispro (HUMALOG  KWIKPEN) 100 UNIT/ML KwikPen INJECT INTO THE SKIN. USE AT LUNCH WITH SLIDING SCALE MAY USE UP TO 10 UNITS    [DISCONTINUED] predniSONE  (STERAPRED UNI-PAK 48 TAB) 10 MG (48) TBPK tablet Use as directed   No facility-administered encounter medications on file as of 01/01/2024.    Surgical History: Past Surgical History:  Procedure Laterality Date   CESAREAN SECTION     COLONOSCOPY WITH PROPOFOL  N/A 03/29/2016   Procedure: COLONOSCOPY WITH PROPOFOL ;  Surgeon: Donnice Vaughn Manes, MD;  Location: [facility] ENDOSCOPY;  Service: Endoscopy;  Laterality: N/A;   ESOPHAGOGASTRODUODENOSCOPY (EGD) WITH PROPOFOL  N/A 09/20/2021   Procedure: ESOPHAGOGASTRODUODENOSCOPY (EGD) WITH PROPOFOL ;  Surgeon: Therisa Bi, MD;  Location: [facility] ENDOSCOPY;  Service: Gastroenterology;  Laterality: N/A;   TUBAL LIGATION      Medical History: Past Medical History:  Diagnosis Date   Bipolar disorder (HCC)    Depression    Diabetes mellitus without complication (HCC)    History of seizure 2020   history of 1 seizure   Hyperlipidemia    Hypertension     Family History: Family History  Problem Relation Age of Onset   Diabetes Mother    Hypertension Mother    Breast cancer Sister     Social History   Socioeconomic  History   Marital status: Married    Spouse name: Not on file   Number of children: Not on file   Years of education: Not on file   Highest education level: Not on file  Occupational History   Not on file  Tobacco Use   Smoking status: Every Day    Current packs/day: 0.50    Average packs/day: 0.5 packs/day for 25.0 years (12.5 ttl pk-yrs)    Types: Cigarettes   Smokeless tobacco: Never   Tobacco comments:    6 -7  cigarettes   Vaping Use   Vaping status: Never Used  Substance and Sexual Activity   Alcohol use: Yes    Alcohol/week: 2.0 standard drinks of alcohol    Types: 2 Cans of beer per  week    Comment: daily    Drug use: No   Sexual activity: Not on file  Other Topics Concern   Not on file  Social History Narrative   Not on file   Social Drivers of Health   Financial Resource Strain: Not on file  Food Insecurity: Not on file  Transportation Needs: Not on file  Physical Activity: Not on file  Stress: Not on file  Social Connections: Not on file  Intimate Partner Violence: Not on file      Review of Systems  Constitutional:  Negative for chills, fatigue and unexpected weight change.  HENT:  Negative for congestion, postnasal drip, rhinorrhea, sneezing and sore throat.   Eyes:  Negative for redness.  Respiratory:  Negative for cough, chest tightness and shortness of breath.   Cardiovascular:  Negative for chest pain and palpitations.  Gastrointestinal:  Negative for abdominal pain, constipation, diarrhea, nausea and vomiting.  Genitourinary:  Negative for dysuria and frequency.  Musculoskeletal:  Negative for arthralgias, back pain, joint swelling and neck pain.  Skin:  Negative for rash.  Neurological: Negative.  Negative for tremors and numbness.  Hematological:  Negative for adenopathy. Does not bruise/bleed easily.  Psychiatric/Behavioral:  Negative for sleep disturbance and suicidal ideas. Behavioral problem: Depression.The patient is not nervous/anxious.     Vital Signs: BP 110/70   Pulse 85   Temp 97.6 F (36.4 C)   Resp 16   Ht 5' 5 (1.651 m)   Wt 160 lb (72.6 kg)   LMP 11/07/2015 (Approximate)   SpO2 99%   BMI 26.63 kg/m    Physical Exam Vitals and nursing note reviewed.  Constitutional:      General: She is not in acute distress.    Appearance: She is well-developed. She is obese. She is not diaphoretic.  HENT:     Head: Normocephalic and atraumatic.     Mouth/Throat:     Pharynx: No oropharyngeal exudate.  Eyes:     Pupils: Pupils are equal, round, and reactive to light.  Neck:     Thyroid : No thyromegaly.     Vascular: No  JVD.     Trachea: No tracheal deviation.  Cardiovascular:     Rate and Rhythm: Normal rate and regular rhythm.     Heart sounds: Normal heart sounds. No murmur heard.    No friction rub. No gallop.  Pulmonary:     Effort: Pulmonary effort is normal.  Abdominal:     General: Bowel sounds are normal.     Palpations: Abdomen is soft.     Tenderness: There is no abdominal tenderness.  Musculoskeletal:        General: Normal range of motion.  Cervical back: Normal range of motion and neck supple.  Lymphadenopathy:     Cervical: No cervical adenopathy.  Skin:    General: Skin is warm and dry.  Neurological:     Mental Status: She is alert and oriented to person, place, and time.     Cranial Nerves: No cranial nerve deficit.  Psychiatric:        Behavior: Behavior normal.        Thought Content: Thought content normal.        Judgment: Judgment normal.        Assessment/Plan: 1. Type 2 diabetes mellitus with hyperglycemia, with long-term current use of insulin  (HCC) (Primary) - POCT HgB A1C is 4.8 and completely controlled. Discussed A1c has been well controlled for a long time now and really can cut back on insulin . May try only once per day dosing pending BG response vs few units BID. Could convert to long acting instead, but pt prefers to use mix for now as she just refilled this. Also discussed considering oral medication instead - Urine Microalbumin w/creat. ratio  2. Essential hypertension Well controlled, continue current medication   General Counseling: Abigail Rice verbalizes understanding of the findings of todays visit and agrees with plan of treatment. I have discussed any further diagnostic evaluation that may be needed or ordered today. We also reviewed her medications today. she has been encouraged to call the office with any questions or concerns that should arise related to todays visit.    Orders Placed This Encounter  Procedures   Urine Microalbumin w/creat.  ratio   POCT HgB A1C    No orders of the defined types were placed in this encounter.   This patient was seen by Tinnie Pro, PA-C in collaboration with Dr. Sigrid Bathe as a part of collaborative care agreement.   Total time spent:30 Minutes Time spent includes review of chart, medications, test results, and follow up plan with the patient.      Dr Fozia M Khan Internal medicine

## 2024-04-01 ENCOUNTER — Encounter: Payer: Self-pay | Admitting: Physician Assistant

## 2024-04-01 ENCOUNTER — Ambulatory Visit (INDEPENDENT_AMBULATORY_CARE_PROVIDER_SITE_OTHER): Payer: Medicaid Other | Admitting: Physician Assistant

## 2024-04-01 VITALS — BP 130/74 | HR 68 | Temp 98.2°F | Resp 16 | Ht 65.0 in | Wt 163.8 lb

## 2024-04-01 DIAGNOSIS — I10 Essential (primary) hypertension: Secondary | ICD-10-CM

## 2024-04-01 DIAGNOSIS — Z794 Long term (current) use of insulin: Secondary | ICD-10-CM

## 2024-04-01 DIAGNOSIS — R569 Unspecified convulsions: Secondary | ICD-10-CM

## 2024-04-01 DIAGNOSIS — E039 Hypothyroidism, unspecified: Secondary | ICD-10-CM | POA: Diagnosis not present

## 2024-04-01 DIAGNOSIS — E1165 Type 2 diabetes mellitus with hyperglycemia: Secondary | ICD-10-CM | POA: Diagnosis not present

## 2024-04-01 DIAGNOSIS — G2581 Restless legs syndrome: Secondary | ICD-10-CM

## 2024-04-01 MED ORDER — AMLODIPINE BESYLATE 5 MG PO TABS: 5.0000 mg | ORAL_TABLET | Freq: Every day | ORAL | 1 refills | Status: DC

## 2024-04-01 MED ORDER — LEVOTHYROXINE SODIUM 50 MCG PO TABS: 50.0000 ug | ORAL_TABLET | Freq: Every day | ORAL | 1 refills | Status: DC

## 2024-04-01 MED ORDER — LEVETIRACETAM 500 MG PO TABS
500.0000 mg | ORAL_TABLET | Freq: Every day | ORAL | 1 refills | Status: DC
Start: 2024-04-01 — End: 2024-11-17

## 2024-04-01 MED ORDER — ROPINIROLE HCL 0.25 MG PO TABS: ORAL_TABLET | ORAL | 1 refills | Status: DC

## 2024-04-01 NOTE — Progress Notes (Signed)
 [facility] [address] [place], [place] [address]  Internal MEDICINE  Office Visit Note  Patient Name: Abigail Rice  [id number]  [id number]  Date of Service: 04/01/2024  Chief Complaint  Patient presents with   Depression   Diabetes   Hypertension   Hyperlipidemia   Follow-up    HPI Pt is here for routine follow up -taking mix 5-10units once per day -insurance requesting updated pap, pt is due for this and previously had declined but is ready to move forward with this now. -got her new reading glasses -takes 1/2 keppra -Started new job at [employer] and gets free lunch, but is doing well with this, She works 4am-12pm now. Happy to be back working and keeping busy.  Current Medication: Outpatient Encounter Medications as of 04/01/2024  Medication Sig   Apple Cider Vinegar 188 MG CAPS Take 1 capsule by mouth daily.   cholecalciferol (VITAMIN D3) 25 MCG (1000 UNIT) tablet Take 1,000 Units by mouth daily.   Insulin Lispro Prot & Lispro (HUMALOG MIX 75/25 KWIKPEN) (75-25) 100 UNIT/ML Kwikpen INJECT 14 UNITS INTO THE SKIN 2 (TWO) TIMES DAILY WITH A MEAL.   Insulin Pen Needle (BD PEN NEEDLE NANO 2ND GEN) 32G X 4 MM MISC USE AS DIRECTED 4 times a day with insulin pens   losartan (COZAAR) 50 MG tablet Take 1 tablet (50 mg total) by mouth daily.   [DISCONTINUED] amLODipine (NORVASC) 5 MG tablet Take 1 tablet (5 mg total) by mouth daily.   [DISCONTINUED] levETIRAcetam (KEPPRA) 500 MG tablet Take 1 tablet (500 mg total) by mouth 2 (two) times daily.   [DISCONTINUED] levothyroxine (SYNTHROID) 50 MCG tablet Take 1 tablet (50 mcg total) by mouth daily before breakfast.   [DISCONTINUED] rOPINIRole (REQUIP) 0.25 MG tablet TAKE 1-2 TABLETS BY MOUTH AT BEDTIME AS NEEDED   amLODipine (NORVASC) 5 MG tablet Take 1 tablet (5 mg total) by mouth daily.   levETIRAcetam (KEPPRA) 500 MG tablet Take 1 tablet (500 mg total) by mouth daily.   levothyroxine (SYNTHROID) 50 MCG tablet Take 1 tablet  (50 mcg total) by mouth daily before breakfast.   rOPINIRole (REQUIP) 0.25 MG tablet TAKE 1-2 TABLETS BY MOUTH AT BEDTIME AS NEEDED   No facility-administered encounter medications on file as of 04/01/2024.    Surgical History: Past Surgical History:  Procedure Laterality Date   CESAREAN SECTION     COLONOSCOPY WITH PROPOFOL N/A 03/29/2016   Procedure: COLONOSCOPY WITH PROPOFOL;  Surgeon: Elnita Maxwell, MD;  Location: [facility] ENDOSCOPY;  Service: Endoscopy;  Laterality: N/A;   ESOPHAGOGASTRODUODENOSCOPY (EGD) WITH PROPOFOL N/A 09/20/2021   Procedure: ESOPHAGOGASTRODUODENOSCOPY (EGD) WITH PROPOFOL;  Surgeon: Wyline Mood, MD;  Location: [facility] ENDOSCOPY;  Service: Gastroenterology;  Laterality: N/A;   TUBAL LIGATION      Medical History: Past Medical History:  Diagnosis Date   Bipolar disorder (HCC)    Depression    Diabetes mellitus without complication (HCC)    History of seizure 2020   history of 1 seizure   Hyperlipidemia    Hypertension     Family History: Family History  Problem Relation Age of Onset   Diabetes Mother    Hypertension Mother    Breast cancer Sister     Social History   Socioeconomic History   Marital status: Married    Spouse name: Not on file   Number of children: Not on file   Years of education: Not on file   Highest education level: Not on file  Occupational  History   Not on file  Tobacco Use   Smoking status: Every Day    Current packs/day: 0.50    Average packs/day: 0.5 packs/day for 25.0 years (12.5 ttl pk-yrs)    Types: Cigarettes   Smokeless tobacco: Never   Tobacco comments:    6 -7  cigarettes   Vaping Use   Vaping status: Never Used  Substance and Sexual Activity   Alcohol use: Yes    Alcohol/week: 2.0 standard drinks of alcohol    Types: 2 Cans of beer per week    Comment: daily    Drug use: No   Sexual activity: Not on file  Other Topics Concern   Not on file  Social History Narrative   Not on file   Social Drivers of  Health   Financial Resource Strain: Not on file  Food Insecurity: Not on file  Transportation Needs: Not on file  Physical Activity: Not on file  Stress: Not on file  Social Connections: Not on file  Intimate Partner Violence: Not on file      Review of Systems  Constitutional:  Negative for chills, fatigue and unexpected weight change.  HENT:  Negative for congestion, postnasal drip, rhinorrhea, sneezing and sore throat.   Eyes:  Negative for redness.  Respiratory:  Negative for cough, chest tightness and shortness of breath.   Cardiovascular:  Negative for chest pain and palpitations.  Gastrointestinal:  Negative for abdominal pain, constipation, diarrhea, nausea and vomiting.  Genitourinary:  Negative for dysuria and frequency.  Musculoskeletal:  Negative for arthralgias, back pain, joint swelling and neck pain.  Skin:  Negative for rash.  Neurological: Negative.  Negative for tremors and numbness.  Hematological:  Negative for adenopathy. Does not bruise/bleed easily.  Psychiatric/Behavioral:  Negative for sleep disturbance and suicidal ideas. Behavioral problem: Depression.The patient is not nervous/anxious.     Vital Signs: BP 130/74   Pulse 68   Temp 98.2 F (36.8 C)   Resp 16   Ht 5\' 5"  (1.651 m)   Wt 163 lb 12.8 oz (74.3 kg)   LMP 11/07/2015 (Approximate)   SpO2 99%   BMI 27.26 kg/m    Physical Exam Vitals and nursing note reviewed.  Constitutional:      General: She is not in acute distress.    Appearance: She is well-developed. She is obese. She is not diaphoretic.  HENT:     Head: Normocephalic and atraumatic.  Eyes:     Pupils: Pupils are equal, round, and reactive to light.  Neck:     Thyroid: No thyromegaly.     Vascular: No JVD.     Trachea: No tracheal deviation.  Cardiovascular:     Rate and Rhythm: Normal rate and regular rhythm.     Heart sounds: Normal heart sounds. No murmur heard.    No friction rub. No gallop.  Pulmonary:     Effort:  Pulmonary effort is normal.  Musculoskeletal:        General: Normal range of motion.     Cervical back: Normal range of motion and neck supple.  Lymphadenopathy:     Cervical: No cervical adenopathy.  Skin:    General: Skin is warm and dry.  Neurological:     Mental Status: She is alert and oriented to person, place, and time.     Cranial Nerves: No cranial nerve deficit.  Psychiatric:        Behavior: Behavior normal.        Thought Content:  Thought content normal.        Judgment: Judgment normal.        Assessment/Plan: 1. Type 2 diabetes mellitus with hyperglycemia, with long-term current use of insulin (HCC) (Primary) BG improving on lower dose insulin still, continue to monitor  2. Essential hypertension Stable, continue current medications - amLODipine (NORVASC) 5 MG tablet; Take 1 tablet (5 mg total) by mouth daily.  Dispense: 90 tablet; Refill: 1  3. Acquired hypothyroidism Continue synthroid as before - levothyroxine (SYNTHROID) 50 MCG tablet; Take 1 tablet (50 mcg total) by mouth daily before breakfast.  Dispense: 90 tablet; Refill: 1  4. Restless legs - rOPINIRole (REQUIP) 0.25 MG tablet; TAKE 1-2 TABLETS BY MOUTH AT BEDTIME AS NEEDED  Dispense: 180 tablet; Refill: 1  5. Seizure (HCC) - levETIRAcetam (KEPPRA) 500 MG tablet; Take 1 tablet (500 mg total) by mouth daily.  Dispense: 90 tablet; Refill: 1   General Counseling: Kendall verbalizes understanding of the findings of todays visit and agrees with plan of treatment. I have discussed any further diagnostic evaluation that may be needed or ordered today. We also reviewed her medications today. she has been encouraged to call the office with any questions or concerns that should arise related to todays visit.    No orders of the defined types were placed in this encounter.   Meds ordered this encounter  Medications   amLODipine (NORVASC) 5 MG tablet    Sig: Take 1 tablet (5 mg total) by mouth daily.     Dispense:  90 tablet    Refill:  1   levothyroxine (SYNTHROID) 50 MCG tablet    Sig: Take 1 tablet (50 mcg total) by mouth daily before breakfast.    Dispense:  90 tablet    Refill:  1   rOPINIRole (REQUIP) 0.25 MG tablet    Sig: TAKE 1-2 TABLETS BY MOUTH AT BEDTIME AS NEEDED    Dispense:  180 tablet    Refill:  1   levETIRAcetam (KEPPRA) 500 MG tablet    Sig: Take 1 tablet (500 mg total) by mouth daily.    Dispense:  90 tablet    Refill:  1    This patient was seen by Lynn Ito, PA-C in collaboration with Dr. Beverely Risen as a part of collaborative care agreement.   Total time spent:30 Minutes Time spent includes review of chart, medications, test results, and follow up plan with the patient.      Dr Lyndon Code Internal medicine

## 2024-04-23 ENCOUNTER — Ambulatory Visit (INDEPENDENT_AMBULATORY_CARE_PROVIDER_SITE_OTHER): Admitting: Physician Assistant

## 2024-04-23 ENCOUNTER — Encounter: Payer: Self-pay | Admitting: Physician Assistant

## 2024-04-23 VITALS — BP 130/70 | HR 72 | Temp 98.0°F | Resp 16 | Ht 65.0 in | Wt 164.0 lb

## 2024-04-23 DIAGNOSIS — E1165 Type 2 diabetes mellitus with hyperglycemia: Secondary | ICD-10-CM | POA: Diagnosis not present

## 2024-04-23 DIAGNOSIS — Z794 Long term (current) use of insulin: Secondary | ICD-10-CM

## 2024-04-23 DIAGNOSIS — K7030 Alcoholic cirrhosis of liver without ascites: Secondary | ICD-10-CM

## 2024-04-23 DIAGNOSIS — Z01419 Encounter for gynecological examination (general) (routine) without abnormal findings: Secondary | ICD-10-CM | POA: Diagnosis not present

## 2024-04-23 DIAGNOSIS — Z124 Encounter for screening for malignant neoplasm of cervix: Secondary | ICD-10-CM

## 2024-04-23 DIAGNOSIS — D696 Thrombocytopenia, unspecified: Secondary | ICD-10-CM

## 2024-04-23 NOTE — Progress Notes (Signed)
 [facility] [address] [place], [place] [address]  Internal MEDICINE  Office Visit Note  Patient Name: Abigail Rice  [id number]  [id number]  Date of Service: 05/04/2024   Chief Complaint  Patient presents with   Follow-up    pap   Hypertension     HPI Pt is here for a pap smear -BP stable -still using low amount of insulin  mix daily, monitoring. Last few A1c have been normal and have been slowly decreasing insulin  -Will not go back to Dr. Antony Baumgartner, will schedule with another GI provider for colonoscopy. Discussed need to move forward with this. Had been held up due to thrombocytopenia and was seeing hematology. Would like referral to new hematologist to re-evaluate this so she can move forward with colonoscopy  Current Medication: Outpatient Encounter Medications as of 04/23/2024  Medication Sig   amLODipine  (NORVASC ) 5 MG tablet Take 1 tablet (5 mg total) by mouth daily.   Apple Cider Vinegar 188 MG CAPS Take 1 capsule by mouth daily.   cholecalciferol (VITAMIN D3) 25 MCG (1000 UNIT) tablet Take 1,000 Units by mouth daily.   Insulin  Lispro Prot & Lispro (HUMALOG  MIX 75/25 KWIKPEN) (75-25) 100 UNIT/ML Kwikpen INJECT 14 UNITS INTO THE SKIN 2 (TWO) TIMES DAILY WITH A MEAL.   Insulin  Pen Needle (BD PEN NEEDLE NANO 2ND GEN) 32G X 4 MM MISC USE AS DIRECTED 4 times a day with insulin  pens   levETIRAcetam  (KEPPRA ) 500 MG tablet Take 1 tablet (500 mg total) by mouth daily.   levothyroxine  (SYNTHROID ) 50 MCG tablet Take 1 tablet (50 mcg total) by mouth daily before breakfast.   losartan  (COZAAR ) 50 MG tablet Take 1 tablet (50 mg total) by mouth daily.   rOPINIRole  (REQUIP ) 0.25 MG tablet TAKE 1-2 TABLETS BY MOUTH AT BEDTIME AS NEEDED   No facility-administered encounter medications on file as of 04/23/2024.    Surgical History: Past Surgical History:  Procedure Laterality Date   CESAREAN SECTION     COLONOSCOPY WITH PROPOFOL  N/A 03/29/2016   Procedure: COLONOSCOPY WITH  PROPOFOL ;  Surgeon: Luella Sager, MD;  Location: [facility] ENDOSCOPY;  Service: Endoscopy;  Laterality: N/A;   ESOPHAGOGASTRODUODENOSCOPY (EGD) WITH PROPOFOL  N/A 09/20/2021   Procedure: ESOPHAGOGASTRODUODENOSCOPY (EGD) WITH PROPOFOL ;  Surgeon: Luke Salaam, MD;  Location: [facility] ENDOSCOPY;  Service: Gastroenterology;  Laterality: N/A;   TUBAL LIGATION      Medical History: Past Medical History:  Diagnosis Date   Bipolar disorder (HCC)    Depression    Diabetes mellitus without complication (HCC)    History of seizure 2020   history of 1 seizure   Hyperlipidemia    Hypertension     Family History: Family History  Problem Relation Age of Onset   Diabetes Mother    Hypertension Mother    Breast cancer Sister     Social History   Socioeconomic History   Marital status: Married    Spouse name: Not on file   Number of children: Not on file   Years of education: Not on file   Highest education level: Not on file  Occupational History   Not on file  Tobacco Use   Smoking status: Every Day    Current packs/day: 0.50    Average packs/day: 0.5 packs/day for 25.0 years (12.5 ttl pk-yrs)    Types: Cigarettes   Smokeless tobacco: Never   Tobacco comments:    6 -7  cigarettes   Vaping Use   Vaping status: Never Used  Substance  and Sexual Activity   Alcohol use: Yes    Alcohol/week: 2.0 standard drinks of alcohol    Types: 2 Cans of beer per week    Comment: daily    Drug use: No   Sexual activity: Not on file  Other Topics Concern   Not on file  Social History Narrative   Not on file   Social Drivers of Health   Financial Resource Strain: Not on file  Food Insecurity: Not on file  Transportation Needs: Not on file  Physical Activity: Not on file  Stress: Not on file  Social Connections: Not on file  Intimate Partner Violence: Not on file     Review of Systems  Constitutional:  Negative for chills, fatigue and unexpected weight change.  HENT:  Negative for  congestion, postnasal drip, rhinorrhea, sneezing and sore throat.   Eyes:  Negative for redness.  Respiratory:  Negative for cough, chest tightness and shortness of breath.   Cardiovascular:  Negative for chest pain and palpitations.  Gastrointestinal:  Negative for abdominal pain, constipation, diarrhea, nausea and vomiting.  Genitourinary:  Negative for dysuria and frequency.  Musculoskeletal:  Negative for arthralgias, back pain, joint swelling and neck pain.  Skin:  Negative for rash.  Neurological: Negative.  Negative for tremors and numbness.  Hematological:  Negative for adenopathy. Does not bruise/bleed easily.  Psychiatric/Behavioral:  Negative for sleep disturbance and suicidal ideas. Behavioral problem: Depression.The patient is not nervous/anxious.      Vital signs: BP 130/70   Pulse 72   Temp 98 F (36.7 C)   Resp 16   Ht 5\' 5"  (1.651 m)   Wt 164 lb (74.4 kg)   LMP 11/07/2015 (Approximate)   SpO2 99%   BMI 27.29 kg/m    Physical Exam Vitals and nursing note reviewed.  Constitutional:      General: She is not in acute distress.    Appearance: She is well-developed. She is obese. She is not diaphoretic.  HENT:     Head: Normocephalic and atraumatic.  Eyes:     Extraocular Movements: Extraocular movements intact.  Neck:     Thyroid : No thyromegaly.     Vascular: No JVD.     Trachea: No tracheal deviation.  Cardiovascular:     Rate and Rhythm: Normal rate and regular rhythm.     Heart sounds: Normal heart sounds. No murmur heard.    No friction rub. No gallop.  Pulmonary:     Effort: Pulmonary effort is normal.  Genitourinary:    Exam position: Lithotomy position.     Comments: Pap performed Musculoskeletal:        General: Normal range of motion.     Cervical back: Normal range of motion and neck supple.  Lymphadenopathy:     Cervical: No cervical adenopathy.  Skin:    General: Skin is warm and dry.  Neurological:     Mental Status: She is alert and  oriented to person, place, and time.  Psychiatric:        Behavior: Behavior normal.        Thought Content: Thought content normal.        Judgment: Judgment normal.       Assessment/Plan: 1. Visit for gynecologic examination (Primary) Pap/pelvic exam performed  2. Cervical cancer screening - IGP, Aptima HPV  3. Type 2 diabetes mellitus with hyperglycemia, with long-term current use of insulin  (HCC) - Microalbumin / creatinine urine ratio  4. Thrombocytopenia (HCC) Will refer to hematology -  Ambulatory referral to Hematology / Oncology  5. Alcoholic cirrhosis, unspecified whether ascites present [facility]) - Ambulatory referral to Hematology / Oncology   General Counseling: Nishika verbalizes understanding of the findings of todays visit and agrees with plan of treatment. I have discussed any further diagnostic evaluation that may be needed or ordered today. We also reviewed her medications today. she has been encouraged to call the office with any questions or concerns that should arise related to todays visit.    Counseling:    Orders Placed This Encounter  Procedures   Microalbumin / creatinine urine ratio   Ambulatory referral to Hematology / Oncology    No orders of the defined types were placed in this encounter.   Time spent:30 Minutes

## 2024-04-24 LAB — MICROALBUMIN / CREATININE URINE RATIO
Creatinine, Urine: 60.9 mg/dL
Microalb/Creat Ratio: 426 mg/g{creat} — ABNORMAL HIGH (ref 0–29)
Microalbumin, Urine: 259.5 ug/mL

## 2024-05-02 LAB — IGP, APTIMA HPV: HPV Aptima: NEGATIVE

## 2024-05-03 NOTE — Progress Notes (Signed)
Lmom to call us  

## 2024-05-04 ENCOUNTER — Ambulatory Visit: Payer: Self-pay

## 2024-05-04 ENCOUNTER — Other Ambulatory Visit: Payer: Self-pay | Admitting: Physician Assistant

## 2024-05-04 ENCOUNTER — Telehealth: Payer: Self-pay

## 2024-05-04 DIAGNOSIS — R809 Proteinuria, unspecified: Secondary | ICD-10-CM

## 2024-05-04 DIAGNOSIS — E1165 Type 2 diabetes mellitus with hyperglycemia: Secondary | ICD-10-CM

## 2024-05-04 NOTE — Telephone Encounter (Signed)
 Sent mychart message and lmom several message to call us  back

## 2024-05-04 NOTE — Telephone Encounter (Signed)
-----   Message from Jacques Mattock sent at 05/03/2024  2:01 PM EDT ----- Please let her know that her pap came back normal with negative HPV, can repeat in 5 years for screening. Additionally her urine protein test did show high level protein still, though improved from last year. This monitors kidneys and would recommend a nephrology referral if she is open to this.

## 2024-05-04 NOTE — Telephone Encounter (Signed)
 Hematology referral ordered at visit, nephrology just added. I told her she could call GI to schedule. She wanted a different GI provider, but previous is no longer there so she can see someone else at same practice

## 2024-05-05 ENCOUNTER — Telehealth: Payer: Self-pay | Admitting: Physician Assistant

## 2024-05-05 NOTE — Telephone Encounter (Signed)
 Pt advised we placed referral  and also advised she can call [facility]

## 2024-05-05 NOTE — Telephone Encounter (Signed)
 Nephrology referral sent via [facility] to [facility].  Notified patient. Gave pt telephone# [phone].  Hematology referral faxed to [facility].  [phone]. Notified patient. Gave pt telephone [phone]. Patient then states she is not going to [facility]. I explained to her there are no other hematologist in this area. She stated she would just go back to see Dr. Suzzane Estes

## 2024-05-10 ENCOUNTER — Other Ambulatory Visit: Payer: Self-pay

## 2024-05-10 DIAGNOSIS — K7030 Alcoholic cirrhosis of liver without ascites: Secondary | ICD-10-CM

## 2024-05-10 DIAGNOSIS — D649 Anemia, unspecified: Secondary | ICD-10-CM

## 2024-05-10 DIAGNOSIS — Z124 Encounter for screening for malignant neoplasm of cervix: Secondary | ICD-10-CM

## 2024-05-10 DIAGNOSIS — D696 Thrombocytopenia, unspecified: Secondary | ICD-10-CM

## 2024-05-12 ENCOUNTER — Telehealth: Payer: Self-pay | Admitting: Physician Assistant

## 2024-05-12 NOTE — Telephone Encounter (Signed)
 Lvm notifying patient that [facility] has been trying to reach her to schedule an appointment. I gave her their telephone # again-Toni

## 2024-05-14 ENCOUNTER — Telehealth: Payer: Self-pay | Admitting: Physician Assistant

## 2024-05-14 NOTE — Telephone Encounter (Signed)
 Per Fredrik Jensen w/ [facility], referral has been closed due to no return call from patient -Archie Bearded

## 2024-06-08 ENCOUNTER — Inpatient Hospital Stay (HOSPITAL_BASED_OUTPATIENT_CLINIC_OR_DEPARTMENT_OTHER): Payer: Self-pay | Admitting: Oncology

## 2024-06-08 ENCOUNTER — Inpatient Hospital Stay: Payer: Self-pay | Attending: Oncology

## 2024-06-08 ENCOUNTER — Encounter: Payer: Self-pay | Admitting: Oncology

## 2024-06-08 VITALS — BP 131/71 | HR 77 | Temp 97.7°F | Resp 16 | Wt 158.8 lb

## 2024-06-08 DIAGNOSIS — D649 Anemia, unspecified: Secondary | ICD-10-CM | POA: Diagnosis not present

## 2024-06-08 DIAGNOSIS — F17210 Nicotine dependence, cigarettes, uncomplicated: Secondary | ICD-10-CM | POA: Diagnosis not present

## 2024-06-08 DIAGNOSIS — K7030 Alcoholic cirrhosis of liver without ascites: Secondary | ICD-10-CM

## 2024-06-08 DIAGNOSIS — D631 Anemia in chronic kidney disease: Secondary | ICD-10-CM | POA: Diagnosis not present

## 2024-06-08 DIAGNOSIS — D696 Thrombocytopenia, unspecified: Secondary | ICD-10-CM | POA: Insufficient documentation

## 2024-06-08 DIAGNOSIS — N189 Chronic kidney disease, unspecified: Secondary | ICD-10-CM | POA: Diagnosis not present

## 2024-06-08 DIAGNOSIS — Z803 Family history of malignant neoplasm of breast: Secondary | ICD-10-CM | POA: Insufficient documentation

## 2024-06-08 LAB — CBC WITH DIFFERENTIAL/PLATELET
Abs Immature Granulocytes: 0.01 10*3/uL (ref 0.00–0.07)
Basophils Absolute: 0 10*3/uL (ref 0.0–0.1)
Basophils Relative: 1 %
Eosinophils Absolute: 0.1 10*3/uL (ref 0.0–0.5)
Eosinophils Relative: 1 %
HCT: 31 % — ABNORMAL LOW (ref 36.0–46.0)
Hemoglobin: 10.4 g/dL — ABNORMAL LOW (ref 12.0–15.0)
Immature Granulocytes: 0 %
Lymphocytes Relative: 56 %
Lymphs Abs: 2.4 10*3/uL (ref 0.7–4.0)
MCH: 32.2 pg (ref 26.0–34.0)
MCHC: 33.5 g/dL (ref 30.0–36.0)
MCV: 96 fL (ref 80.0–100.0)
Monocytes Absolute: 0.5 10*3/uL (ref 0.1–1.0)
Monocytes Relative: 12 %
Neutro Abs: 1.3 10*3/uL — ABNORMAL LOW (ref 1.7–7.7)
Neutrophils Relative %: 30 %
Platelets: 45 10*3/uL — ABNORMAL LOW (ref 150–400)
RBC: 3.23 MIL/uL — ABNORMAL LOW (ref 3.87–5.11)
RDW: 12.8 % (ref 11.5–15.5)
WBC: 4.4 10*3/uL (ref 4.0–10.5)
nRBC: 0 % (ref 0.0–0.2)

## 2024-06-08 LAB — COMPREHENSIVE METABOLIC PANEL WITH GFR
ALT: 48 U/L — ABNORMAL HIGH (ref 0–44)
AST: 59 U/L — ABNORMAL HIGH (ref 15–41)
Albumin: 3.1 g/dL — ABNORMAL LOW (ref 3.5–5.0)
Alkaline Phosphatase: 161 U/L — ABNORMAL HIGH (ref 38–126)
Anion gap: 8 (ref 5–15)
BUN: 24 mg/dL — ABNORMAL HIGH (ref 6–20)
CO2: 21 mmol/L — ABNORMAL LOW (ref 22–32)
Calcium: 8.3 mg/dL — ABNORMAL LOW (ref 8.9–10.3)
Chloride: 107 mmol/L (ref 98–111)
Creatinine, Ser: 1.36 mg/dL — ABNORMAL HIGH (ref 0.44–1.00)
GFR, Estimated: 45 mL/min — ABNORMAL LOW (ref >60)
Glucose, Bld: 148 mg/dL — ABNORMAL HIGH (ref 70–99)
Potassium: 4.3 mmol/L (ref 3.5–5.1)
Sodium: 136 mmol/L (ref 135–145)
Total Bilirubin: 1.4 mg/dL — ABNORMAL HIGH (ref 0.0–1.2)
Total Protein: 6.9 g/dL (ref 6.5–8.1)

## 2024-06-08 LAB — PROTIME-INR
INR: 1.4 — ABNORMAL HIGH (ref 0.8–1.2)
Prothrombin Time: 17.1 s — ABNORMAL HIGH (ref 11.4–15.2)

## 2024-06-08 LAB — IMMATURE PLATELET FRACTION: Immature Platelet Fraction: 7.7 % (ref 1.2–8.6)

## 2024-06-08 LAB — APTT: aPTT: 30 s (ref 24–36)

## 2024-06-08 NOTE — Assessment & Plan Note (Addendum)
 Anemia due to chronic kidney disease. Hemoglobin stable.  Continue observation.

## 2024-06-08 NOTE — Assessment & Plan Note (Signed)
 Recommend patient to follow-up with gastroenterology.  Referral to GI.

## 2024-06-08 NOTE — Addendum Note (Signed)
 Addended by: [name] on: 06/08/2024 08:04 PM   Modules accepted: Orders

## 2024-06-08 NOTE — Assessment & Plan Note (Addendum)
#  Thrombocytopenia possible due to peripheral destruction-possible ITP, chronic liver disease/alcohol consumption.  Previously platelet count improved after 4 days course of dexamethasone . Labs reviewed and discussed with patient. Platelet count 45,000.  Immature platelet fraction normalized. For future elective procedures, could consider a course of steroids.  If platelet count is persistently less than 50,000, consider platelet transfusion prior to procedure. TPO can be considered as well.

## 2024-06-08 NOTE — Progress Notes (Signed)
 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  Previously platelet count improved after 4 days course of dexamethasone . Labs reviewed and discussed with patient. Platelet count 45,000.  Immature platelet fraction normalized. For future elective procedures, could consider a course of steroids.  If platelet count is persistently less than 50,000, consider platelet transfusion prior to procedure. TPO can be considered as well.  Normocytic anemia Anemia due to chronic kidney disease. Hemoglobin stable.  Continue observation.  Alcoholic cirrhosis (HCC) Recommend patient to follow-up with gastroenterology.  Referral to GI.  Orders Placed This Encounter  Procedures   CBC with Differential (Cancer Center Only)    Standing Status:   Future    Expected Date:   12/08/2024    Expiration Date:   03/08/2025   CMP (Cancer Center only)    Standing Status:   Future    Expected Date:   12/08/2024    Expiration Date:   03/08/2025   AFP tumor marker    Standing Status:   Future    Expected Date:   12/08/2024    Expiration Date:   03/08/2025   Ambulatory referral to Gastroenterology    Referral Priority:   Routine    Referral Type:   Consultation    Referral Reason:   Specialty Services Required    Number of Visits Requested:   1   Follow-up- she will call back to schedule when she gets insurance coverage.  All questions were answered. The patient knows to call the clinic with any problems, questions or concerns.  [name], MD, PhD [facility] Hematology Oncology 06/08/2024    HISTORY OF PRESENTING ILLNESS:  Abigail Rice is a 58 y.o. female who was seen in consultation at the request of Jacques Mattock, PA* for evaluation of  thrombocytopenia   Reviewed patient's labs done previously.  Labs showed decreased platelet counts at 43,000, wbc 4.1 hemoglobin 10.7 Reviewed patient's previous labs. Thrombocytopenia is chronic chronic onset , since at least 2019.  Platelet count in 2019 was 1 43,000.  Starting August 2020, platelet count has been in the range of 50-60,000 No aggravating or elevated factors.  Associated symptoms or signs:  Denies weight loss, fever, chills, fatigue, night sweats.  Denies hematochezia, hematuria, hematemesis, epistaxis, black tarry stool.  easy bruising.   Context:  History of hepatitis or HIV infection, denies  Patient has a history of heavy alcohol use.  She reports that she has cut down her alcohol intake since last year and currently she drinks 3-4 beer per day.  With a history of seizure and currently on Keppra .  #Thrombocytopenia increased to 80,000 after a course of dexamethasone  40 mg daily for 4 days. Platelet count further decreased to 38,000 a few weeks later.  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  Findings are favored to be reactive in nature.FISH results is normal.  INTERVAL HISTORY Abigail Rice is a 58 y.o. female who has above history reviewed by me today presents for follow up visit for management of thrombocytopenia. Patient presents to establish care.  Last seen in August 2024. Patient reports feeling well at baseline.  She has decreased alcohol use. Denies hematochezia, hematuria, hematemesis, epistaxis, black tarry stool or easy bruising.     Review of Systems  Constitutional:  Negative for appetite  change, chills, fatigue and fever.  HENT:   Negative for hearing loss and voice change.   Eyes:  Negative for eye problems.  Respiratory:  Negative for chest tightness and cough.   Cardiovascular:  Negative for chest pain.  Gastrointestinal:  Negative for abdominal distention,  abdominal pain and blood in stool.  Endocrine: Negative for hot flashes.  Genitourinary:  Negative for difficulty urinating and frequency.   Musculoskeletal:  Negative for arthralgias.  Skin:  Negative for itching and rash.  Neurological:  Negative for extremity weakness.  Hematological:  Negative for adenopathy.  Psychiatric/Behavioral:  Negative for confusion.     MEDICAL HISTORY:  Past Medical History:  Diagnosis Date   Bipolar disorder (HCC)    Depression    Diabetes mellitus without complication (HCC)    History of seizure 2020   history of 1 seizure   Hyperlipidemia    Hypertension     SURGICAL HISTORY: Past Surgical History:  Procedure Laterality Date   CESAREAN SECTION     COLONOSCOPY WITH PROPOFOL  N/A 03/29/2016   Procedure: COLONOSCOPY WITH PROPOFOL ;  Surgeon: Luella Sager, MD;  Location: [facility] ENDOSCOPY;  Service: Endoscopy;  Laterality: N/A;   ESOPHAGOGASTRODUODENOSCOPY (EGD) WITH PROPOFOL  N/A 09/20/2021   Procedure: ESOPHAGOGASTRODUODENOSCOPY (EGD) WITH PROPOFOL ;  Surgeon: Luke Salaam, MD;  Location: [facility] ENDOSCOPY;  Service: Gastroenterology;  Laterality: N/A;   TUBAL LIGATION      SOCIAL HISTORY: Social History   Socioeconomic History   Marital status: Married    Spouse name: Not on file   Number of children: Not on file   Years of education: Not on file   Highest education level: Not on file  Occupational History   Not on file  Tobacco Use   Smoking status: Every Day    Current packs/day: 0.50    Average packs/day: 0.5 packs/day for 25.0 years (12.5 ttl pk-yrs)    Types: Cigarettes   Smokeless tobacco: Never   Tobacco comments:    6 -7  cigarettes   Vaping Use   Vaping status: Never Used  Substance and Sexual Activity   Alcohol use: Yes    Alcohol/week: 2.0 standard drinks of alcohol    Types: 2 Cans of beer per week    Comment: daily    Drug use: No   Sexual activity: Not on file  Other Topics Concern   Not on file  Social History  Narrative   Not on file   Social Drivers of Health   Financial Resource Strain: Not on file  Food Insecurity: Not on file  Transportation Needs: Not on file  Physical Activity: Not on file  Stress: Not on file  Social Connections: Not on file  Intimate Partner Violence: Not on file    FAMILY HISTORY: Family History  Problem Relation Age of Onset   Diabetes Mother    Hypertension Mother    Breast cancer Sister     ALLERGIES:  is allergic to prevnar 20 [pneumococcal 20-val conj vacc], ace inhibitors, ciprofloxacin , lisinopril, risperdal [risperidone], vytorin [ezetimibe-simvastatin], and macrobid  [nitrofurantoin ].  MEDICATIONS:  Current Outpatient Medications  Medication Sig Dispense Refill   amLODipine  (NORVASC ) 5 MG tablet Take 1 tablet (5 mg total) by mouth daily. 90 tablet 1   Apple Cider Vinegar 188 MG CAPS Take 1 capsule by mouth daily.     cholecalciferol (VITAMIN D3) 25 MCG (1000 UNIT) tablet Take 1,000 Units by mouth daily.     Insulin  Lispro Prot & Lispro (HUMALOG  MIX 75/25 KWIKPEN) (75-25) 100  UNIT/ML Kwikpen INJECT 14 UNITS INTO THE SKIN 2 (TWO) TIMES DAILY WITH A MEAL. 15 mL 11   Insulin  Pen Needle (BD PEN NEEDLE NANO 2ND GEN) 32G X 4 MM MISC USE AS DIRECTED 4 times a day with insulin  pens 400 each 3   levETIRAcetam  (KEPPRA ) 500 MG tablet Take 1 tablet (500 mg total) by mouth daily. 90 tablet 1   levothyroxine  (SYNTHROID ) 50 MCG tablet Take 1 tablet (50 mcg total) by mouth daily before breakfast. 90 tablet 1   losartan  (COZAAR ) 50 MG tablet Take 1 tablet (50 mg total) by mouth daily. 90 tablet 3   rOPINIRole  (REQUIP ) 0.25 MG tablet TAKE 1-2 TABLETS BY MOUTH AT BEDTIME AS NEEDED 180 tablet 1   No current facility-administered medications for this visit.     PHYSICAL EXAMINATION: ECOG PERFORMANCE STATUS: 1 - Symptomatic but completely ambulatory Vitals:   06/08/24 1420  BP: 131/71  Pulse: 77  Resp: 16  Temp: 97.7 F (36.5 C)  SpO2: 99%   Filed Weights    06/08/24 1420  Weight: 158 lb 12.8 oz (72 kg)    Physical Exam Constitutional:      General: She is not in acute distress. HENT:     Head: Normocephalic and atraumatic.   Eyes:     General: No scleral icterus.   Cardiovascular:     Rate and Rhythm: Normal rate and regular rhythm.  Pulmonary:     Effort: Pulmonary effort is normal. No respiratory distress.     Breath sounds: No wheezing.  Abdominal:     General: Bowel sounds are normal. There is no distension.     Palpations: Abdomen is soft.   Musculoskeletal:        General: No deformity. Normal range of motion.     Cervical back: Normal range of motion and neck supple.     Left lower leg: No edema.   Skin:    General: Skin is warm and dry.     Findings: No erythema or rash.   Neurological:     Mental Status: She is alert and oriented to person, place, and time. Mental status is at baseline.   Psychiatric:        Mood and Affect: Mood normal.      LABORATORY DATA:  I have reviewed the data as listed    Latest Ref Rng & Units 06/08/2024    2:05 PM 08/01/2023   10:18 AM 01/31/2023   11:45 AM  CBC  WBC 4.0 - 10.5 K/uL 4.4  4.6  5.7   Hemoglobin 12.0 - 15.0 g/dL 16.1  09.6  04.5   Hematocrit 36.0 - 46.0 % 31.0  30.6  31.4   Platelets 150 - 400 K/uL 45  33  70       Latest Ref Rng & Units 06/08/2024    2:05 PM 08/01/2023   10:18 AM 01/31/2023   11:45 AM  CMP  Glucose 70 - 99 mg/dL 409  811  914   BUN 6 - 20 mg/dL 24  16  10    Creatinine 0.44 - 1.00 mg/dL 7.82  9.56  2.13   Sodium 135 - 145 mmol/L 136  133  130   Potassium 3.5 - 5.1 mmol/L 4.3  3.9  3.6   Chloride 98 - 111 mmol/L 107  107  100   CO2 22 - 32 mmol/L 21  20  21    Calcium 8.9 - 10.3 mg/dL 8.3  8.4  8.2  Total Protein 6.5 - 8.1 g/dL 6.9  7.0  7.8   Total Bilirubin 0.0 - 1.2 mg/dL 1.4  0.9  0.7   Alkaline Phos 38 - 126 U/L 161  205  142   AST 15 - 41 U/L 59  96  56   ALT 0 - 44 U/L 48  82  41      Iron/TIBC/Ferritin/ %Sat    Component Value  Date/Time   IRON 156 01/17/2021 1120   TIBC 206 (L) 01/17/2021 1120   FERRITIN 595 (H) 01/17/2021 1120   IRONPCTSAT 76 (H) 01/17/2021 1120

## 2024-06-09 ENCOUNTER — Telehealth: Payer: Self-pay

## 2024-06-09 LAB — AFP TUMOR MARKER: AFP, Serum, Tumor Marker: 6.3 ng/mL (ref 0.0–9.2)

## 2024-06-09 NOTE — Telephone Encounter (Signed)
 GI referral faxed to [facility]. [id number] cirrhosis. Fax confirmation received.

## 2024-06-16 ENCOUNTER — Telehealth: Payer: Self-pay | Admitting: Physician Assistant

## 2024-06-16 NOTE — Telephone Encounter (Signed)
 Lvm & sent mychart msg to move 09/27/24 appointment -Andree

## 2024-06-16 NOTE — Telephone Encounter (Signed)
 Pt scheduled to see GI on 08/31/24

## 2024-07-08 ENCOUNTER — Telehealth: Payer: Self-pay | Admitting: Physician Assistant

## 2024-07-08 NOTE — Telephone Encounter (Signed)
 Left 3rd vm to move 09/27/24 appointment and mailed letter-Toni

## 2024-07-11 ENCOUNTER — Other Ambulatory Visit: Payer: Self-pay | Admitting: Physician Assistant

## 2024-07-11 DIAGNOSIS — E1165 Type 2 diabetes mellitus with hyperglycemia: Secondary | ICD-10-CM

## 2024-09-02 ENCOUNTER — Encounter: Payer: Self-pay | Admitting: Physician Assistant

## 2024-09-02 ENCOUNTER — Ambulatory Visit (INDEPENDENT_AMBULATORY_CARE_PROVIDER_SITE_OTHER): Admitting: Physician Assistant

## 2024-09-02 ENCOUNTER — Encounter: Payer: Self-pay | Admitting: Oncology

## 2024-09-02 VITALS — BP 130/70 | HR 73 | Temp 97.8°F | Resp 16 | Ht 65.0 in | Wt 149.0 lb

## 2024-09-02 DIAGNOSIS — D696 Thrombocytopenia, unspecified: Secondary | ICD-10-CM | POA: Diagnosis not present

## 2024-09-02 DIAGNOSIS — E1165 Type 2 diabetes mellitus with hyperglycemia: Secondary | ICD-10-CM

## 2024-09-02 DIAGNOSIS — Z794 Long term (current) use of insulin: Secondary | ICD-10-CM | POA: Diagnosis not present

## 2024-09-02 DIAGNOSIS — I10 Essential (primary) hypertension: Secondary | ICD-10-CM | POA: Diagnosis not present

## 2024-09-02 DIAGNOSIS — R634 Abnormal weight loss: Secondary | ICD-10-CM

## 2024-09-02 LAB — POCT GLYCOSYLATED HEMOGLOBIN (HGB A1C): Hemoglobin A1C: 5.3 % (ref 4.0–5.6)

## 2024-09-02 NOTE — Progress Notes (Signed)
 [facility] [address] [place], [place] [address]  Internal MEDICINE  Office Visit Note  Patient Name: Abigail Rice  [id number]  [id number]  Date of Service: 09/02/2024  Chief Complaint  Patient presents with   Follow-up   Depression   Hypertension    HPI Pt is here for routine follow up -BG low 100s typically in afternoon, avg of 7units per day -down 15lbs since last visit, has a good appetite but having problem with teeth and can't eat much. Saw dentist today but they are fixing dentures later today and should helps -still waiting on colonoscopy due to low platelets, currently scheduled for GI visit in October to plan for colonoscopy date and she will be reaching out to hematology once date known so that they can plan accordingly. She is frustrated that it keeps getting pushed and is anxious to get colonoscopy updated -BP stable  Current Medication: Outpatient Encounter Medications as of 09/02/2024  Medication Sig   amLODipine  (NORVASC ) 5 MG tablet Take 1 tablet (5 mg total) by mouth daily.   Apple Cider Vinegar 188 MG CAPS Take 1 capsule by mouth daily.   cholecalciferol (VITAMIN D3) 25 MCG (1000 UNIT) tablet Take 1,000 Units by mouth daily.   Insulin  Lispro Prot & Lispro (HUMALOG  75/25 MIX) (75-25) 100 UNIT/ML Kwikpen INJECT 14 UNITS INTO THE SKIN 2 (TWO) TIMES DAILY WITH A MEAL.   Insulin  Pen Needle (BD PEN NEEDLE NANO 2ND GEN) 32G X 4 MM MISC USE AS DIRECTED 4 times a day with insulin  pens   levETIRAcetam  (KEPPRA ) 500 MG tablet Take 1 tablet (500 mg total) by mouth daily.   levothyroxine  (SYNTHROID ) 50 MCG tablet Take 1 tablet (50 mcg total) by mouth daily before breakfast.   losartan  (COZAAR ) 50 MG tablet Take 1 tablet (50 mg total) by mouth daily.   rOPINIRole  (REQUIP ) 0.25 MG tablet TAKE 1-2 TABLETS BY MOUTH AT BEDTIME AS NEEDED   No facility-administered encounter medications on file as of 09/02/2024.    Surgical History: Past Surgical History:   Procedure Laterality Date   CESAREAN SECTION     COLONOSCOPY WITH PROPOFOL  N/A 03/29/2016   Procedure: COLONOSCOPY WITH PROPOFOL ;  Surgeon: Donnice Vaughn Manes, MD;  Location: [facility] ENDOSCOPY;  Service: Endoscopy;  Laterality: N/A;   ESOPHAGOGASTRODUODENOSCOPY (EGD) WITH PROPOFOL  N/A 09/20/2021   Procedure: ESOPHAGOGASTRODUODENOSCOPY (EGD) WITH PROPOFOL ;  Surgeon: Therisa Bi, MD;  Location: [facility] ENDOSCOPY;  Service: Gastroenterology;  Laterality: N/A;   TUBAL LIGATION      Medical History: Past Medical History:  Diagnosis Date   Bipolar disorder (HCC)    Depression    Diabetes mellitus without complication (HCC)    History of seizure 2020   history of 1 seizure   Hyperlipidemia    Hypertension     Family History: Family History  Problem Relation Age of Onset   Diabetes Mother    Hypertension Mother    Breast cancer Sister     Social History   Socioeconomic History   Marital status: Married    Spouse name: Not on file   Number of children: Not on file   Years of education: Not on file   Highest education level: Not on file  Occupational History   Not on file  Tobacco Use   Smoking status: Every Day    Current packs/day: 0.50    Average packs/day: 0.5 packs/day for 25.0 years (12.5 ttl pk-yrs)    Types: Cigarettes   Smokeless tobacco: Never   Tobacco  comments:    6 -7  cigarettes   Vaping Use   Vaping status: Never Used  Substance and Sexual Activity   Alcohol use: Yes    Alcohol/week: 2.0 standard drinks of alcohol    Types: 2 Cans of beer per week    Comment: daily    Drug use: No   Sexual activity: Not on file  Other Topics Concern   Not on file  Social History Narrative   Not on file   Social Drivers of Health   Financial Resource Strain: Not on file  Food Insecurity: Not on file  Transportation Needs: Not on file  Physical Activity: Not on file  Stress: Not on file  Social Connections: Not on file  Intimate Partner Violence: Not on file       Review of Systems  Constitutional:  Negative for chills, fatigue and unexpected weight change.  HENT:  Negative for congestion, postnasal drip, rhinorrhea, sneezing and sore throat.   Eyes:  Negative for redness.  Respiratory:  Negative for cough, chest tightness and shortness of breath.   Cardiovascular:  Negative for chest pain and palpitations.  Gastrointestinal:  Negative for abdominal pain, constipation, diarrhea, nausea and vomiting.  Genitourinary:  Negative for dysuria and frequency.  Musculoskeletal:  Negative for arthralgias, back pain, joint swelling and neck pain.  Skin:  Negative for rash.  Neurological: Negative.  Negative for tremors and numbness.  Hematological:  Negative for adenopathy. Does not bruise/bleed easily.  Psychiatric/Behavioral:  Negative for sleep disturbance and suicidal ideas. Behavioral problem: Depression.The patient is nervous/anxious.     Vital Signs: BP 130/70   Pulse 73   Temp 97.8 F (36.6 C)   Resp 16   Ht 5' 5 (1.651 m)   Wt 149 lb (67.6 kg)   LMP 11/07/2015 (Approximate)   SpO2 98%   BMI 24.79 kg/m    Physical Exam Vitals and nursing note reviewed.  Constitutional:      General: She is not in acute distress.    Appearance: She is well-developed. She is obese. She is not diaphoretic.  HENT:     Head: Normocephalic and atraumatic.  Eyes:     Pupils: Pupils are equal, round, and reactive to light.  Neck:     Thyroid : No thyromegaly.     Vascular: No JVD.     Trachea: No tracheal deviation.  Cardiovascular:     Rate and Rhythm: Normal rate and regular rhythm.     Heart sounds: Normal heart sounds. No murmur heard.    No friction rub. No gallop.  Pulmonary:     Effort: Pulmonary effort is normal.  Musculoskeletal:        General: Normal range of motion.     Cervical back: Normal range of motion and neck supple.  Lymphadenopathy:     Cervical: No cervical adenopathy.  Skin:    General: Skin is warm and dry.   Neurological:     Mental Status: She is alert and oriented to person, place, and time.     Cranial Nerves: No cranial nerve deficit.  Psychiatric:        Thought Content: Thought content normal.        Judgment: Judgment normal.     Comments: Tearful when discussing colonoscopy concerns        Assessment/Plan: 1. Type 2 diabetes mellitus with hyperglycemia, with long-term current use of insulin  (HCC) (Primary) - POCT HgB A1C is 5.3 which is still in normal range. May continue  to reduce insulin   2. Essential hypertension Stable, continue current medications  3. Thrombocytopenia (HCC) Followed by oncology  4. Abnormal weight loss Down 15 lbs in 3 months unintentionally, but attributes this to dental concerns and inability to eat normally. Will continue to monitor as teeth are being addressed today by dentist   General Counseling: Addisson verbalizes understanding of the findings of todays visit and agrees with plan of treatment. I have discussed any further diagnostic evaluation that may be needed or ordered today. We also reviewed her medications today. she has been encouraged to call the office with any questions or concerns that should arise related to todays visit.    Orders Placed This Encounter  Procedures   POCT HgB A1C    No orders of the defined types were placed in this encounter.   This patient was seen by Tinnie Pro, PA-C in collaboration with Dr. Sigrid Bathe as a part of collaborative care agreement.   Total time spent:30 Minutes Time spent includes review of chart, medications, test results, and follow up plan with the patient.      Dr Fozia M Khan Internal medicine

## 2024-09-21 NOTE — Telephone Encounter (Signed)
 Pt scheduled to see Dr. Unk on 11/15/24

## 2024-09-25 ENCOUNTER — Other Ambulatory Visit: Payer: Self-pay | Admitting: Physician Assistant

## 2024-09-25 DIAGNOSIS — I10 Essential (primary) hypertension: Secondary | ICD-10-CM

## 2024-09-27 ENCOUNTER — Encounter: Payer: No Typology Code available for payment source | Admitting: Physician Assistant

## 2024-10-25 ENCOUNTER — Encounter: Payer: Self-pay | Admitting: Physician Assistant

## 2024-10-25 ENCOUNTER — Ambulatory Visit (INDEPENDENT_AMBULATORY_CARE_PROVIDER_SITE_OTHER): Admitting: Physician Assistant

## 2024-10-25 VITALS — BP 135/75 | HR 84 | Temp 98.0°F | Resp 16 | Ht 65.0 in | Wt 148.0 lb

## 2024-10-25 DIAGNOSIS — Z794 Long term (current) use of insulin: Secondary | ICD-10-CM

## 2024-10-25 DIAGNOSIS — G2581 Restless legs syndrome: Secondary | ICD-10-CM

## 2024-10-25 DIAGNOSIS — Z0001 Encounter for general adult medical examination with abnormal findings: Secondary | ICD-10-CM | POA: Diagnosis not present

## 2024-10-25 DIAGNOSIS — R300 Dysuria: Secondary | ICD-10-CM

## 2024-10-25 DIAGNOSIS — E039 Hypothyroidism, unspecified: Secondary | ICD-10-CM | POA: Diagnosis not present

## 2024-10-25 DIAGNOSIS — E1165 Type 2 diabetes mellitus with hyperglycemia: Secondary | ICD-10-CM

## 2024-10-25 DIAGNOSIS — I10 Essential (primary) hypertension: Secondary | ICD-10-CM | POA: Diagnosis not present

## 2024-10-25 DIAGNOSIS — E785 Hyperlipidemia, unspecified: Secondary | ICD-10-CM

## 2024-10-25 MED ORDER — AMLODIPINE BESYLATE 5 MG PO TABS: 5.0000 mg | ORAL_TABLET | Freq: Every day | ORAL | 1 refills | Status: AC

## 2024-10-25 MED ORDER — LEVOTHYROXINE SODIUM 50 MCG PO TABS: 50.0000 ug | ORAL_TABLET | Freq: Every day | ORAL | 1 refills | Status: AC

## 2024-10-25 MED ORDER — ROPINIROLE HCL 0.25 MG PO TABS: ORAL_TABLET | ORAL | 1 refills | Status: AC

## 2024-10-25 NOTE — Progress Notes (Unsigned)
 [facility] [address] [place], [place] [address]  Internal MEDICINE  Office Visit Note  Patient Name: Abigail Rice  [id number]  [id number]  Date of Service: 10/25/2024  Chief Complaint  Patient presents with   Annual Exam   Depression   Diabetes   Hypertension   Hyperlipidemia   Medication Refill     HPI Pt is here for routine health maintenance examination Diabetic eye exam in feb/march time frame -mammogram for next month -Lost 1 lb, unintentional, appetite normal  -BG stable 115-130 in evening not fasting -[facility] in [place] for GI to establish care -stockings on and declines foot exam  Current Medication: Outpatient Encounter Medications as of 10/25/2024  Medication Sig   amLODipine  (NORVASC ) 5 MG tablet Take 1 tablet (5 mg total) by mouth daily.   Insulin  Lispro Prot & Lispro (HUMALOG  75/25 MIX) (75-25) 100 UNIT/ML Kwikpen INJECT 14 UNITS INTO THE SKIN 2 (TWO) TIMES DAILY WITH A MEAL.   Insulin  Pen Needle (BD PEN NEEDLE NANO 2ND GEN) 32G X 4 MM MISC USE AS DIRECTED 4 times a day with insulin  pens   levETIRAcetam  (KEPPRA ) 500 MG tablet Take 1 tablet (500 mg total) by mouth daily.   levothyroxine  (SYNTHROID ) 50 MCG tablet Take 1 tablet (50 mcg total) by mouth daily before breakfast.   losartan  (COZAAR ) 50 MG tablet TAKE 1 TABLET BY MOUTH EVERY DAY   rOPINIRole  (REQUIP ) 0.25 MG tablet TAKE 1-2 TABLETS BY MOUTH AT BEDTIME AS NEEDED   [DISCONTINUED] Apple Cider Vinegar 188 MG CAPS Take 1 capsule by mouth daily.   [DISCONTINUED] cholecalciferol (VITAMIN D3) 25 MCG (1000 UNIT) tablet Take 1,000 Units by mouth daily.   No facility-administered encounter medications on file as of 10/25/2024.    Surgical History: Past Surgical History:  Procedure Laterality Date   CESAREAN SECTION     COLONOSCOPY WITH PROPOFOL  N/A 03/29/2016   Procedure: COLONOSCOPY WITH PROPOFOL ;  Surgeon: Donnice Vaughn Manes, MD;  Location: [facility] ENDOSCOPY;  Service: Endoscopy;  Laterality: N/A;    ESOPHAGOGASTRODUODENOSCOPY (EGD) WITH PROPOFOL  N/A 09/20/2021   Procedure: ESOPHAGOGASTRODUODENOSCOPY (EGD) WITH PROPOFOL ;  Surgeon: Therisa Bi, MD;  Location: [facility] ENDOSCOPY;  Service: Gastroenterology;  Laterality: N/A;   TUBAL LIGATION      Medical History: Past Medical History:  Diagnosis Date   Bipolar disorder (HCC)    Depression    Diabetes mellitus without complication (HCC)    History of seizure 2020   history of 1 seizure   Hyperlipidemia    Hypertension     Family History: Family History  Problem Relation Age of Onset   Diabetes Mother    Hypertension Mother    Breast cancer Sister       Review of Systems   Vital Signs: BP 135/75   Pulse 84   Temp 98 F (36.7 C)   Resp 16   Ht 5' 5 (1.651 m)   Wt 148 lb (67.1 kg)   LMP 11/07/2015 (Approximate)   SpO2 98%   BMI 24.63 kg/m    Physical Exam   LABS: Recent Results (from the past 2160 hours)  POCT HgB A1C     Status: None   Collection Time: 09/02/24 11:53 AM  Result Value Ref Range   Hemoglobin A1C 5.3 4.0 - 5.6 %   HbA1c POC (<> result, manual entry)     HbA1c, POC (prediabetic range)     HbA1c, POC (controlled diabetic range)      .MAMM    Assessment/Plan:  General Counseling: Abigail Rice verbalizes understanding of the findings of todays visit and agrees with plan of treatment. I have discussed any further diagnostic evaluation that may be needed or ordered today. We also reviewed her medications today. she has been encouraged to call the office with any questions or concerns that should arise related to todays visit.    Counseling:    Orders Placed This Encounter  Procedures   UA/M w/rflx Culture, Routine    No orders of the defined types were placed in this encounter.   This patient was seen by Abigail Pro, PA-C in collaboration with Dr. Sigrid Bathe as a part of collaborative care agreement.  Total time spent:*** Minutes  Time spent includes review of chart,  medications, test results, and follow up plan with the patient.     Sigrid CHRISTELLA Bathe, MD  Internal Medicine

## 2024-11-08 ENCOUNTER — Other Ambulatory Visit: Payer: Self-pay | Admitting: Physician Assistant

## 2024-11-08 DIAGNOSIS — R569 Unspecified convulsions: Secondary | ICD-10-CM

## 2024-12-08 ENCOUNTER — Ambulatory Visit: Admitting: Oncology

## 2024-12-08 ENCOUNTER — Other Ambulatory Visit

## 2025-01-27 ENCOUNTER — Encounter: Payer: Self-pay | Admitting: Physician Assistant

## 2025-01-27 ENCOUNTER — Ambulatory Visit (INDEPENDENT_AMBULATORY_CARE_PROVIDER_SITE_OTHER): Admitting: Physician Assistant

## 2025-01-27 VITALS — BP 110/68 | HR 70 | Temp 98.0°F | Resp 16 | Ht 65.0 in | Wt 149.2 lb

## 2025-01-27 DIAGNOSIS — E1165 Type 2 diabetes mellitus with hyperglycemia: Secondary | ICD-10-CM

## 2025-01-27 LAB — POCT GLYCOSYLATED HEMOGLOBIN (HGB A1C): Hemoglobin A1C: 5.3 % (ref 4.0–5.6)

## 2025-01-27 NOTE — Progress Notes (Unsigned)
 [facility] [address] [place], [place] [address]  Internal MEDICINE  Office Visit Note  Patient Name: Abigail Rice  [id number]  [id number]  Date of Service: 01/27/2025  Chief Complaint  Patient presents with   Follow-up   Diabetes   Depression   Hypertension   Hyperlipidemia    HPI Pt is here for routine follow up -BP stable -Sugars doing well, 8-10units daily, may drop further since A1c still normal, but will get occasional highs and doesn't want to d/c completely -still having denture adjustments -still needs lipids/thyroid  rechecked -[facility] appt in [place] on 2/24 then will follow up with Dr. Babara -scheduling eye exam  Current Medication: Outpatient Encounter Medications as of 01/27/2025  Medication Sig   amLODipine  (NORVASC ) 5 MG tablet Take 1 tablet (5 mg total) by mouth daily.   Insulin  Lispro Prot & Lispro (HUMALOG  75/25 MIX) (75-25) 100 UNIT/ML Kwikpen INJECT 14 UNITS INTO THE SKIN 2 (TWO) TIMES DAILY WITH A MEAL.   Insulin  Pen Needle (BD PEN NEEDLE NANO 2ND GEN) 32G X 4 MM MISC USE AS DIRECTED 4 times a day with insulin  pens   levETIRAcetam  (KEPPRA ) 500 MG tablet TAKE 1 TABLET (500 MG TOTAL) BY MOUTH DAILY.   levothyroxine  (SYNTHROID ) 50 MCG tablet Take 1 tablet (50 mcg total) by mouth daily before breakfast.   losartan  (COZAAR ) 50 MG tablet TAKE 1 TABLET BY MOUTH EVERY DAY   rOPINIRole  (REQUIP ) 0.25 MG tablet TAKE 1-2 TABLETS BY MOUTH AT BEDTIME AS NEEDED   No facility-administered encounter medications on file as of 01/27/2025.    Surgical History: Past Surgical History:  Procedure Laterality Date   CESAREAN SECTION     COLONOSCOPY WITH PROPOFOL  N/A 03/29/2016   Procedure: COLONOSCOPY WITH PROPOFOL ;  Surgeon: Donnice Vaughn Manes, MD;  Location: [facility] ENDOSCOPY;  Service: Endoscopy;  Laterality: N/A;   ESOPHAGOGASTRODUODENOSCOPY (EGD) WITH PROPOFOL  N/A 09/20/2021   Procedure: ESOPHAGOGASTRODUODENOSCOPY (EGD) WITH PROPOFOL ;  Surgeon: Therisa Bi, MD;  Location: [facility] ENDOSCOPY;  Service: Gastroenterology;  Laterality: N/A;   TUBAL LIGATION      Medical History: Past Medical History:  Diagnosis Date   Bipolar disorder (HCC)    Depression    Diabetes mellitus without complication (HCC)    History of seizure 2020   history of 1 seizure   Hyperlipidemia    Hypertension     Family History: Family History  Problem Relation Age of Onset   Diabetes Mother    Hypertension Mother    Breast cancer Sister     Social History   Socioeconomic History   Marital status: Married    Spouse name: Not on file   Number of children: Not on file   Years of education: Not on file   Highest education level: Not on file  Occupational History   Not on file  Tobacco Use   Smoking status: Every Day    Current packs/day: 0.50    Average packs/day: 0.5 packs/day for 25.0 years (12.5 ttl pk-yrs)    Types: Cigarettes   Smokeless tobacco: Never   Tobacco comments:    6 -7  cigarettes   Vaping Use   Vaping status: Never Used  Substance and Sexual Activity   Alcohol use: Yes    Alcohol/week: 2.0 standard drinks of alcohol    Types: 2 Cans of beer per week    Comment: daily    Drug use: No   Sexual activity: Not on file  Other Topics Concern  Not on file  Social History Narrative   Not on file   Social Drivers of Health   Tobacco Use: High Risk (01/27/2025)   Patient History    Smoking Tobacco Use: Every Day    Smokeless Tobacco Use: Never    Passive Exposure: Not on file  Financial Resource Strain: Not on file  Food Insecurity: Not on file  Transportation Needs: Not on file  Physical Activity: Not on file  Stress: Not on file  Social Connections: Not on file  Intimate Partner Violence: Not on file  Depression (PHQ2-9): Low Risk (10/25/2024)   Depression (PHQ2-9)    PHQ-2 Score: 0  Alcohol Screen: Low Risk (01/27/2025)   Alcohol Screen    Last Alcohol Screening Score (AUDIT): 2  Housing: Not on file  Utilities: Not on  file  Health Literacy: Not on file      Review of Systems  Vital Signs: BP 110/68   Pulse 70   Temp 98 F (36.7 C)   Resp 16   Ht 5' 5 (1.651 m)   Wt 149 lb 3.2 oz (67.7 kg)   LMP 11/07/2015   SpO2 100%   BMI 24.83 kg/m    Physical Exam     Assessment/Plan:   General Counseling: Abigail Rice verbalizes understanding of the findings of todays visit and agrees with plan of treatment. I have discussed any further diagnostic evaluation that may be needed or ordered today. We also reviewed her medications today. she has been encouraged to call the office with any questions or concerns that should arise related to todays visit.    Orders Placed This Encounter  Procedures   POCT HgB A1C    No orders of the defined types were placed in this encounter.   This patient was seen by Tinnie Pro, PA-C in collaboration with Dr. Sigrid Bathe as a part of collaborative care agreement.   Total time spent:*** Minutes Time spent includes review of chart, medications, test results, and follow up plan with the patient.      Dr Fozia M Khan Internal medicine

## 2025-06-01 ENCOUNTER — Ambulatory Visit: Admitting: Physician Assistant

## 2025-10-27 ENCOUNTER — Encounter: Admitting: Physician Assistant
